# Patient Record
Sex: Female | Born: 1935
Health system: Southern US, Community
[De-identification: ages and names within clinical notes are randomized; demographics above are authoritative.]

## PROBLEM LIST (undated history)

## (undated) DIAGNOSIS — I10 Essential (primary) hypertension: Secondary | ICD-10-CM

## (undated) NOTE — *Deleted (*Deleted)
-----------------------------------------   11:41 PM on 06/21/2020 -----------------------------------------  Assuming care from Dr. Larinda Buttery.  In short, Tina [name] is a 34 y.o. female with a chief complaint of fall.  Refer to the original H&P for additional details.  The current plan of care is to reassess troponin.  Patient wants to go home, so anticipate discharge unless trop is abnormal.

---

## 1969-03-30 HISTORY — PX: DILATION AND CURETTAGE OF UTERUS: SHX78

## 1979-03-31 HISTORY — PX: ABDOMINAL HYSTERECTOMY: SHX81

## 1994-12-21 DIAGNOSIS — E669 Obesity, unspecified: Secondary | ICD-10-CM | POA: Insufficient documentation

## 2002-07-30 HISTORY — PX: KNEE SURGERY: SHX244

## 2002-08-13 ENCOUNTER — Encounter: Payer: Self-pay | Admitting: Orthopedic Surgery

## 2002-08-19 ENCOUNTER — Inpatient Hospital Stay (HOSPITAL_COMMUNITY): Admission: RE | Admit: 2002-08-19 | Discharge: 2002-08-26 | Payer: Self-pay | Admitting: Orthopedic Surgery

## 2003-08-19 LAB — HM COLONOSCOPY

## 2005-10-10 ENCOUNTER — Encounter: Payer: Self-pay | Admitting: Internal Medicine

## 2005-10-28 ENCOUNTER — Encounter: Payer: Self-pay | Admitting: Internal Medicine

## 2006-12-28 ENCOUNTER — Ambulatory Visit: Payer: Self-pay | Admitting: Family Medicine

## 2009-02-14 DIAGNOSIS — R351 Nocturia: Secondary | ICD-10-CM | POA: Insufficient documentation

## 2009-02-14 DIAGNOSIS — N959 Unspecified menopausal and perimenopausal disorder: Secondary | ICD-10-CM | POA: Insufficient documentation

## 2009-02-15 ENCOUNTER — Inpatient Hospital Stay: Payer: Self-pay | Admitting: Internal Medicine

## 2010-07-12 ENCOUNTER — Ambulatory Visit: Payer: Self-pay | Admitting: Family Medicine

## 2010-10-25 ENCOUNTER — Other Ambulatory Visit (HOSPITAL_COMMUNITY): Payer: Self-pay | Admitting: Internal Medicine

## 2010-10-25 DIAGNOSIS — R0781 Pleurodynia: Secondary | ICD-10-CM

## 2010-10-30 ENCOUNTER — Ambulatory Visit (HOSPITAL_COMMUNITY): Payer: Self-pay

## 2010-10-30 ENCOUNTER — Encounter (HOSPITAL_COMMUNITY)
Admission: RE | Admit: 2010-10-30 | Discharge: 2010-10-30 | Disposition: A | Discharge status: Home / Self Care | Payer: Medicare Other | Source: Ambulatory Visit | Attending: Internal Medicine | Admitting: Internal Medicine

## 2010-10-30 DIAGNOSIS — R0781 Pleurodynia: Secondary | ICD-10-CM

## 2010-10-30 DIAGNOSIS — M899 Disorder of bone, unspecified: Secondary | ICD-10-CM | POA: Insufficient documentation

## 2010-10-30 DIAGNOSIS — M949 Disorder of cartilage, unspecified: Secondary | ICD-10-CM | POA: Insufficient documentation

## 2010-10-30 DIAGNOSIS — R079 Chest pain, unspecified: Secondary | ICD-10-CM | POA: Insufficient documentation

## 2010-10-30 MED ORDER — TECHNETIUM TC 99M MEDRONATE IV KIT
25.0000 | PACK | Freq: Once | INTRAVENOUS | Status: AC | PRN
  Administered 2010-10-30: 25 via INTRAVENOUS

## 2011-07-11 ENCOUNTER — Ambulatory Visit: Payer: Self-pay | Admitting: Family Medicine

## 2011-08-01 DIAGNOSIS — N83209 Unspecified ovarian cyst, unspecified side: Secondary | ICD-10-CM | POA: Diagnosis not present

## 2011-08-03 ENCOUNTER — Ambulatory Visit: Payer: Self-pay | Admitting: Urology

## 2011-08-03 DIAGNOSIS — Q6239 Other obstructive defects of renal pelvis and ureter: Secondary | ICD-10-CM | POA: Diagnosis not present

## 2011-08-03 DIAGNOSIS — R948 Abnormal results of function studies of other organs and systems: Secondary | ICD-10-CM | POA: Diagnosis not present

## 2011-08-03 DIAGNOSIS — N2889 Other specified disorders of kidney and ureter: Secondary | ICD-10-CM | POA: Diagnosis not present

## 2011-08-21 ENCOUNTER — Ambulatory Visit: Payer: Self-pay | Admitting: Obstetrics & Gynecology

## 2011-08-21 DIAGNOSIS — D279 Benign neoplasm of unspecified ovary: Secondary | ICD-10-CM | POA: Diagnosis not present

## 2011-08-21 DIAGNOSIS — N83209 Unspecified ovarian cyst, unspecified side: Secondary | ICD-10-CM | POA: Diagnosis not present

## 2011-08-21 DIAGNOSIS — Z01810 Encounter for preprocedural cardiovascular examination: Secondary | ICD-10-CM | POA: Diagnosis not present

## 2011-08-21 DIAGNOSIS — Z01818 Encounter for other preprocedural examination: Secondary | ICD-10-CM | POA: Diagnosis not present

## 2011-08-21 DIAGNOSIS — Z01812 Encounter for preprocedural laboratory examination: Secondary | ICD-10-CM | POA: Diagnosis not present

## 2011-08-21 LAB — POTASSIUM: Potassium: 4.1 mmol/L (ref 3.5–5.1)

## 2011-08-21 LAB — CBC
HGB: 12.5 g/dL (ref 12.0–16.0)
MCHC: 33.6 g/dL (ref 32.0–36.0)
Platelet: 308 10*3/uL (ref 150–440)
RBC: 3.99 10*6/uL (ref 3.80–5.20)

## 2011-09-04 ENCOUNTER — Ambulatory Visit: Payer: Self-pay | Admitting: Obstetrics & Gynecology

## 2011-09-04 DIAGNOSIS — Z96659 Presence of unspecified artificial knee joint: Secondary | ICD-10-CM | POA: Diagnosis not present

## 2011-09-04 DIAGNOSIS — Z7982 Long term (current) use of aspirin: Secondary | ICD-10-CM | POA: Diagnosis not present

## 2011-09-04 DIAGNOSIS — Z79899 Other long term (current) drug therapy: Secondary | ICD-10-CM | POA: Diagnosis not present

## 2011-09-04 DIAGNOSIS — M129 Arthropathy, unspecified: Secondary | ICD-10-CM | POA: Diagnosis not present

## 2011-09-04 DIAGNOSIS — D279 Benign neoplasm of unspecified ovary: Secondary | ICD-10-CM | POA: Diagnosis not present

## 2011-09-04 DIAGNOSIS — K660 Peritoneal adhesions (postprocedural) (postinfection): Secondary | ICD-10-CM | POA: Diagnosis not present

## 2011-09-04 DIAGNOSIS — N83209 Unspecified ovarian cyst, unspecified side: Secondary | ICD-10-CM | POA: Diagnosis not present

## 2011-09-04 DIAGNOSIS — N838 Other noninflammatory disorders of ovary, fallopian tube and broad ligament: Secondary | ICD-10-CM | POA: Diagnosis not present

## 2011-09-06 LAB — PATHOLOGY REPORT

## 2011-10-09 DIAGNOSIS — Z1231 Encounter for screening mammogram for malignant neoplasm of breast: Secondary | ICD-10-CM | POA: Diagnosis not present

## 2011-10-09 LAB — HM MAMMOGRAPHY

## 2011-11-02 DIAGNOSIS — S60219A Contusion of unspecified wrist, initial encounter: Secondary | ICD-10-CM | POA: Diagnosis not present

## 2011-11-02 DIAGNOSIS — S8000XA Contusion of unspecified knee, initial encounter: Secondary | ICD-10-CM | POA: Diagnosis not present

## 2011-11-02 DIAGNOSIS — S1093XA Contusion of unspecified part of neck, initial encounter: Secondary | ICD-10-CM | POA: Diagnosis not present

## 2011-11-02 DIAGNOSIS — S0083XA Contusion of other part of head, initial encounter: Secondary | ICD-10-CM | POA: Diagnosis not present

## 2011-11-08 DIAGNOSIS — M159 Polyosteoarthritis, unspecified: Secondary | ICD-10-CM | POA: Diagnosis not present

## 2011-11-08 DIAGNOSIS — M353 Polymyalgia rheumatica: Secondary | ICD-10-CM | POA: Diagnosis not present

## 2011-11-08 DIAGNOSIS — M545 Low back pain: Secondary | ICD-10-CM | POA: Diagnosis not present

## 2012-01-17 DIAGNOSIS — M25539 Pain in unspecified wrist: Secondary | ICD-10-CM | POA: Diagnosis not present

## 2012-01-17 DIAGNOSIS — M159 Polyosteoarthritis, unspecified: Secondary | ICD-10-CM | POA: Diagnosis not present

## 2012-01-17 DIAGNOSIS — M25839 Other specified joint disorders, unspecified wrist: Secondary | ICD-10-CM | POA: Diagnosis not present

## 2012-01-17 DIAGNOSIS — M353 Polymyalgia rheumatica: Secondary | ICD-10-CM | POA: Diagnosis not present

## 2012-01-17 DIAGNOSIS — M19039 Primary osteoarthritis, unspecified wrist: Secondary | ICD-10-CM | POA: Diagnosis not present

## 2012-01-24 DIAGNOSIS — M159 Polyosteoarthritis, unspecified: Secondary | ICD-10-CM | POA: Diagnosis not present

## 2012-01-24 DIAGNOSIS — M25539 Pain in unspecified wrist: Secondary | ICD-10-CM | POA: Diagnosis not present

## 2012-01-24 DIAGNOSIS — M353 Polymyalgia rheumatica: Secondary | ICD-10-CM | POA: Diagnosis not present

## 2012-01-30 ENCOUNTER — Other Ambulatory Visit: Payer: Self-pay | Admitting: Internal Medicine

## 2012-01-30 DIAGNOSIS — M25531 Pain in right wrist: Secondary | ICD-10-CM

## 2012-02-06 ENCOUNTER — Ambulatory Visit
Admission: RE | Admit: 2012-02-06 | Discharge: 2012-02-06 | Disposition: A | Discharge status: Home / Self Care | Payer: Medicare Other | Source: Ambulatory Visit | Attending: Internal Medicine | Admitting: Internal Medicine

## 2012-02-06 DIAGNOSIS — M659 Synovitis and tenosynovitis, unspecified: Secondary | ICD-10-CM | POA: Diagnosis not present

## 2012-02-06 DIAGNOSIS — M25531 Pain in right wrist: Secondary | ICD-10-CM

## 2012-02-06 DIAGNOSIS — S63509A Unspecified sprain of unspecified wrist, initial encounter: Secondary | ICD-10-CM | POA: Diagnosis not present

## 2012-02-06 DIAGNOSIS — M19049 Primary osteoarthritis, unspecified hand: Secondary | ICD-10-CM | POA: Diagnosis not present

## 2012-02-06 MED ORDER — GADOBENATE DIMEGLUMINE 529 MG/ML IV SOLN: 16.0000 mL | Freq: Once | INTRAVENOUS | Status: AC | PRN

## 2012-03-11 DIAGNOSIS — M159 Polyosteoarthritis, unspecified: Secondary | ICD-10-CM | POA: Diagnosis not present

## 2012-03-11 DIAGNOSIS — M353 Polymyalgia rheumatica: Secondary | ICD-10-CM | POA: Diagnosis not present

## 2012-03-11 DIAGNOSIS — M25539 Pain in unspecified wrist: Secondary | ICD-10-CM | POA: Diagnosis not present

## 2012-03-20 DIAGNOSIS — S8000XA Contusion of unspecified knee, initial encounter: Secondary | ICD-10-CM | POA: Diagnosis not present

## 2012-03-20 DIAGNOSIS — L0291 Cutaneous abscess, unspecified: Secondary | ICD-10-CM | POA: Diagnosis not present

## 2012-03-20 DIAGNOSIS — L0390 Cellulitis, unspecified: Secondary | ICD-10-CM | POA: Diagnosis not present

## 2012-03-20 DIAGNOSIS — S60219A Contusion of unspecified wrist, initial encounter: Secondary | ICD-10-CM | POA: Diagnosis not present

## 2012-04-04 DIAGNOSIS — S8000XA Contusion of unspecified knee, initial encounter: Secondary | ICD-10-CM | POA: Diagnosis not present

## 2012-04-04 DIAGNOSIS — L0390 Cellulitis, unspecified: Secondary | ICD-10-CM | POA: Diagnosis not present

## 2012-04-04 DIAGNOSIS — S60219A Contusion of unspecified wrist, initial encounter: Secondary | ICD-10-CM | POA: Diagnosis not present

## 2012-04-04 DIAGNOSIS — L0291 Cutaneous abscess, unspecified: Secondary | ICD-10-CM | POA: Diagnosis not present

## 2012-04-10 DIAGNOSIS — L03221 Cellulitis of neck: Secondary | ICD-10-CM | POA: Diagnosis not present

## 2012-04-10 DIAGNOSIS — H6120 Impacted cerumen, unspecified ear: Secondary | ICD-10-CM | POA: Diagnosis not present

## 2012-04-10 DIAGNOSIS — H903 Sensorineural hearing loss, bilateral: Secondary | ICD-10-CM | POA: Diagnosis not present

## 2012-04-29 DIAGNOSIS — L03221 Cellulitis of neck: Secondary | ICD-10-CM | POA: Diagnosis not present

## 2012-04-29 DIAGNOSIS — L0211 Cutaneous abscess of neck: Secondary | ICD-10-CM | POA: Diagnosis not present

## 2012-05-02 DIAGNOSIS — L570 Actinic keratosis: Secondary | ICD-10-CM | POA: Diagnosis not present

## 2012-05-02 DIAGNOSIS — D692 Other nonthrombocytopenic purpura: Secondary | ICD-10-CM | POA: Diagnosis not present

## 2012-05-05 DIAGNOSIS — Z23 Encounter for immunization: Secondary | ICD-10-CM | POA: Diagnosis not present

## 2012-07-10 DIAGNOSIS — M159 Polyosteoarthritis, unspecified: Secondary | ICD-10-CM | POA: Diagnosis not present

## 2012-07-10 DIAGNOSIS — M353 Polymyalgia rheumatica: Secondary | ICD-10-CM | POA: Diagnosis not present

## 2012-07-10 DIAGNOSIS — M25539 Pain in unspecified wrist: Secondary | ICD-10-CM | POA: Diagnosis not present

## 2012-08-11 DIAGNOSIS — G4700 Insomnia, unspecified: Secondary | ICD-10-CM | POA: Diagnosis not present

## 2012-08-11 DIAGNOSIS — R233 Spontaneous ecchymoses: Secondary | ICD-10-CM | POA: Diagnosis not present

## 2012-08-11 DIAGNOSIS — R229 Localized swelling, mass and lump, unspecified: Secondary | ICD-10-CM | POA: Diagnosis not present

## 2012-08-11 DIAGNOSIS — I10 Essential (primary) hypertension: Secondary | ICD-10-CM | POA: Diagnosis not present

## 2012-08-25 DIAGNOSIS — G4700 Insomnia, unspecified: Secondary | ICD-10-CM | POA: Diagnosis not present

## 2012-08-25 DIAGNOSIS — M542 Cervicalgia: Secondary | ICD-10-CM | POA: Diagnosis not present

## 2012-08-25 DIAGNOSIS — I10 Essential (primary) hypertension: Secondary | ICD-10-CM | POA: Diagnosis not present

## 2012-09-22 DIAGNOSIS — R7309 Other abnormal glucose: Secondary | ICD-10-CM | POA: Diagnosis not present

## 2012-09-22 DIAGNOSIS — I10 Essential (primary) hypertension: Secondary | ICD-10-CM | POA: Diagnosis not present

## 2012-09-22 DIAGNOSIS — G4700 Insomnia, unspecified: Secondary | ICD-10-CM | POA: Diagnosis not present

## 2012-11-06 DIAGNOSIS — M159 Polyosteoarthritis, unspecified: Secondary | ICD-10-CM | POA: Diagnosis not present

## 2012-11-06 DIAGNOSIS — M25539 Pain in unspecified wrist: Secondary | ICD-10-CM | POA: Diagnosis not present

## 2012-11-06 DIAGNOSIS — M353 Polymyalgia rheumatica: Secondary | ICD-10-CM | POA: Diagnosis not present

## 2013-01-21 DIAGNOSIS — M1990 Unspecified osteoarthritis, unspecified site: Secondary | ICD-10-CM | POA: Diagnosis not present

## 2013-01-21 DIAGNOSIS — G4700 Insomnia, unspecified: Secondary | ICD-10-CM | POA: Diagnosis not present

## 2013-01-21 DIAGNOSIS — I10 Essential (primary) hypertension: Secondary | ICD-10-CM | POA: Diagnosis not present

## 2013-02-24 DIAGNOSIS — M159 Polyosteoarthritis, unspecified: Secondary | ICD-10-CM | POA: Diagnosis not present

## 2013-02-24 DIAGNOSIS — M169 Osteoarthritis of hip, unspecified: Secondary | ICD-10-CM | POA: Diagnosis not present

## 2013-02-24 DIAGNOSIS — M25539 Pain in unspecified wrist: Secondary | ICD-10-CM | POA: Diagnosis not present

## 2013-02-24 DIAGNOSIS — M353 Polymyalgia rheumatica: Secondary | ICD-10-CM | POA: Diagnosis not present

## 2013-02-24 DIAGNOSIS — M25559 Pain in unspecified hip: Secondary | ICD-10-CM | POA: Diagnosis not present

## 2013-03-11 DIAGNOSIS — M1610 Unilateral primary osteoarthritis, unspecified hip: Secondary | ICD-10-CM | POA: Diagnosis not present

## 2013-03-11 DIAGNOSIS — M25559 Pain in unspecified hip: Secondary | ICD-10-CM | POA: Diagnosis not present

## 2013-03-17 DIAGNOSIS — I10 Essential (primary) hypertension: Secondary | ICD-10-CM | POA: Diagnosis not present

## 2013-04-07 DIAGNOSIS — Z23 Encounter for immunization: Secondary | ICD-10-CM | POA: Diagnosis not present

## 2013-05-15 DIAGNOSIS — Z01818 Encounter for other preprocedural examination: Secondary | ICD-10-CM | POA: Diagnosis not present

## 2013-05-15 DIAGNOSIS — G4700 Insomnia, unspecified: Secondary | ICD-10-CM | POA: Diagnosis not present

## 2013-05-15 DIAGNOSIS — M1990 Unspecified osteoarthritis, unspecified site: Secondary | ICD-10-CM | POA: Diagnosis not present

## 2013-05-27 DIAGNOSIS — M353 Polymyalgia rheumatica: Secondary | ICD-10-CM | POA: Diagnosis not present

## 2013-05-27 DIAGNOSIS — R82998 Other abnormal findings in urine: Secondary | ICD-10-CM | POA: Diagnosis not present

## 2013-05-27 DIAGNOSIS — M1610 Unilateral primary osteoarthritis, unspecified hip: Secondary | ICD-10-CM | POA: Diagnosis not present

## 2013-05-27 DIAGNOSIS — M25559 Pain in unspecified hip: Secondary | ICD-10-CM | POA: Diagnosis not present

## 2013-05-27 DIAGNOSIS — Z01818 Encounter for other preprocedural examination: Secondary | ICD-10-CM | POA: Diagnosis not present

## 2013-06-01 DIAGNOSIS — Z96649 Presence of unspecified artificial hip joint: Secondary | ICD-10-CM | POA: Diagnosis not present

## 2013-06-01 DIAGNOSIS — Z471 Aftercare following joint replacement surgery: Secondary | ICD-10-CM | POA: Diagnosis not present

## 2013-06-01 DIAGNOSIS — Z5189 Encounter for other specified aftercare: Secondary | ICD-10-CM | POA: Diagnosis not present

## 2013-06-01 DIAGNOSIS — M069 Rheumatoid arthritis, unspecified: Secondary | ICD-10-CM | POA: Diagnosis present

## 2013-06-01 DIAGNOSIS — M353 Polymyalgia rheumatica: Secondary | ICD-10-CM | POA: Diagnosis present

## 2013-06-01 DIAGNOSIS — M1610 Unilateral primary osteoarthritis, unspecified hip: Secondary | ICD-10-CM | POA: Diagnosis not present

## 2013-06-01 HISTORY — PX: HIP SURGERY: SHX245

## 2013-06-04 DIAGNOSIS — Z5189 Encounter for other specified aftercare: Secondary | ICD-10-CM | POA: Diagnosis not present

## 2013-06-04 DIAGNOSIS — K56609 Unspecified intestinal obstruction, unspecified as to partial versus complete obstruction: Secondary | ICD-10-CM | POA: Diagnosis not present

## 2013-06-04 DIAGNOSIS — R1084 Generalized abdominal pain: Secondary | ICD-10-CM | POA: Diagnosis not present

## 2013-06-04 DIAGNOSIS — M069 Rheumatoid arthritis, unspecified: Secondary | ICD-10-CM | POA: Diagnosis not present

## 2013-06-04 DIAGNOSIS — Z471 Aftercare following joint replacement surgery: Secondary | ICD-10-CM | POA: Diagnosis not present

## 2013-06-04 DIAGNOSIS — Z96649 Presence of unspecified artificial hip joint: Secondary | ICD-10-CM | POA: Diagnosis not present

## 2013-06-07 DIAGNOSIS — K56609 Unspecified intestinal obstruction, unspecified as to partial versus complete obstruction: Secondary | ICD-10-CM | POA: Diagnosis not present

## 2013-06-07 DIAGNOSIS — R9431 Abnormal electrocardiogram [ECG] [EKG]: Secondary | ICD-10-CM | POA: Diagnosis not present

## 2013-06-07 LAB — COMPREHENSIVE METABOLIC PANEL
Albumin: 2.2 g/dL — ABNORMAL LOW (ref 3.4–5.0)
Alkaline Phosphatase: 155 U/L — ABNORMAL HIGH (ref 50–136)
Anion Gap: 8 (ref 7–16)
BUN: 33 mg/dL — ABNORMAL HIGH (ref 7–18)
Bilirubin,Total: 0.6 mg/dL (ref 0.2–1.0)
Calcium, Total: 9.3 mg/dL (ref 8.5–10.1)
Chloride: 95 mmol/L — ABNORMAL LOW (ref 98–107)
Creatinine: 0.99 mg/dL (ref 0.60–1.30)
EGFR (African American): >60
EGFR (Non-African Amer.): 55 — ABNORMAL LOW
Glucose: 137 mg/dL — ABNORMAL HIGH (ref 65–99)
Osmolality: 278 (ref 275–301)
Potassium: 3.5 mmol/L (ref 3.5–5.1)
SGOT(AST): 23 U/L (ref 15–37)
SGPT (ALT): 25 U/L (ref 12–78)
Sodium: 134 mmol/L — ABNORMAL LOW (ref 136–145)
Total Protein: 7.1 g/dL (ref 6.4–8.2)

## 2013-06-07 LAB — CBC
HCT: 33 % — ABNORMAL LOW (ref 35.0–47.0)
MCHC: 34.6 g/dL (ref 32.0–36.0)
MCV: 92 fL (ref 80–100)
Platelet: 525 10*3/uL — ABNORMAL HIGH (ref 150–440)
RDW: 15.4 % — ABNORMAL HIGH (ref 11.5–14.5)
WBC: 13.1 10*3/uL — ABNORMAL HIGH (ref 3.6–11.0)

## 2013-06-08 ENCOUNTER — Inpatient Hospital Stay: Payer: Self-pay | Admitting: Surgery

## 2013-06-08 DIAGNOSIS — R109 Unspecified abdominal pain: Secondary | ICD-10-CM | POA: Diagnosis not present

## 2013-06-08 DIAGNOSIS — K658 Other peritonitis: Secondary | ICD-10-CM | POA: Diagnosis not present

## 2013-06-08 DIAGNOSIS — D126 Benign neoplasm of colon, unspecified: Secondary | ICD-10-CM | POA: Diagnosis not present

## 2013-06-08 DIAGNOSIS — R0989 Other specified symptoms and signs involving the circulatory and respiratory systems: Secondary | ICD-10-CM | POA: Diagnosis not present

## 2013-06-08 DIAGNOSIS — M129 Arthropathy, unspecified: Secondary | ICD-10-CM | POA: Diagnosis present

## 2013-06-08 DIAGNOSIS — Z90710 Acquired absence of both cervix and uterus: Secondary | ICD-10-CM | POA: Diagnosis not present

## 2013-06-08 DIAGNOSIS — H9190 Unspecified hearing loss, unspecified ear: Secondary | ICD-10-CM | POA: Diagnosis present

## 2013-06-08 DIAGNOSIS — K5732 Diverticulitis of large intestine without perforation or abscess without bleeding: Secondary | ICD-10-CM | POA: Diagnosis not present

## 2013-06-08 DIAGNOSIS — Z433 Encounter for attention to colostomy: Secondary | ICD-10-CM | POA: Diagnosis not present

## 2013-06-08 DIAGNOSIS — I10 Essential (primary) hypertension: Secondary | ICD-10-CM | POA: Diagnosis present

## 2013-06-08 DIAGNOSIS — D62 Acute posthemorrhagic anemia: Secondary | ICD-10-CM | POA: Diagnosis not present

## 2013-06-08 DIAGNOSIS — Z452 Encounter for adjustment and management of vascular access device: Secondary | ICD-10-CM | POA: Diagnosis not present

## 2013-06-08 DIAGNOSIS — R9431 Abnormal electrocardiogram [ECG] [EKG]: Secondary | ICD-10-CM | POA: Diagnosis not present

## 2013-06-08 DIAGNOSIS — Z96659 Presence of unspecified artificial knee joint: Secondary | ICD-10-CM | POA: Diagnosis not present

## 2013-06-08 DIAGNOSIS — E441 Mild protein-calorie malnutrition: Secondary | ICD-10-CM | POA: Diagnosis not present

## 2013-06-08 DIAGNOSIS — K560 Paralytic ileus: Secondary | ICD-10-CM | POA: Diagnosis not present

## 2013-06-08 DIAGNOSIS — IMO0001 Reserved for inherently not codable concepts without codable children: Secondary | ICD-10-CM | POA: Diagnosis not present

## 2013-06-08 DIAGNOSIS — K929 Disease of digestive system, unspecified: Secondary | ICD-10-CM | POA: Diagnosis not present

## 2013-06-08 DIAGNOSIS — Z96649 Presence of unspecified artificial hip joint: Secondary | ICD-10-CM | POA: Diagnosis not present

## 2013-06-08 DIAGNOSIS — D72829 Elevated white blood cell count, unspecified: Secondary | ICD-10-CM | POA: Diagnosis not present

## 2013-06-08 DIAGNOSIS — K5730 Diverticulosis of large intestine without perforation or abscess without bleeding: Secondary | ICD-10-CM | POA: Diagnosis not present

## 2013-06-08 DIAGNOSIS — R269 Unspecified abnormalities of gait and mobility: Secondary | ICD-10-CM | POA: Diagnosis not present

## 2013-06-08 DIAGNOSIS — Z6830 Body mass index (BMI) 30.0-30.9, adult: Secondary | ICD-10-CM | POA: Diagnosis not present

## 2013-06-08 DIAGNOSIS — K56609 Unspecified intestinal obstruction, unspecified as to partial versus complete obstruction: Secondary | ICD-10-CM | POA: Diagnosis not present

## 2013-06-08 HISTORY — PX: COLON SURGERY: SHX602

## 2013-06-08 LAB — CBC WITH DIFFERENTIAL/PLATELET
Basophil #: 0 10*3/uL (ref 0.0–0.1)
Basophil %: 0.2 %
Eosinophil #: 0.1 10*3/uL (ref 0.0–0.7)
Eosinophil %: 0.6 %
HCT: 31.8 % — ABNORMAL LOW (ref 35.0–47.0)
HGB: 11.1 g/dL — ABNORMAL LOW (ref 12.0–16.0)
Lymphocyte #: 0.2 10*3/uL — ABNORMAL LOW (ref 1.0–3.6)
MCH: 32 pg (ref 26.0–34.0)
MCHC: 34.8 g/dL (ref 32.0–36.0)
MCV: 92 fL (ref 80–100)
Monocyte #: 0.4 x10 3/mm (ref 0.2–0.9)
Monocyte %: 3.5 %
Neutrophil #: 9.9 10*3/uL — ABNORMAL HIGH (ref 1.4–6.5)
Platelet: 518 10*3/uL — ABNORMAL HIGH (ref 150–440)
WBC: 10.5 10*3/uL (ref 3.6–11.0)

## 2013-06-08 LAB — URINALYSIS, COMPLETE
Glucose,UR: NEGATIVE mg/dL (ref 0–75)
Leukocyte Esterase: NEGATIVE
Nitrite: NEGATIVE
Ph: 5 (ref 4.5–8.0)
RBC,UR: 37 /HPF (ref 0–5)
Squamous Epithelial: NONE SEEN
WBC UR: 9 /HPF (ref 0–5)

## 2013-06-08 LAB — BASIC METABOLIC PANEL
Anion Gap: 8 (ref 7–16)
Glucose: 168 mg/dL — ABNORMAL HIGH (ref 65–99)
Potassium: 3.6 mmol/L (ref 3.5–5.1)
Sodium: 139 mmol/L (ref 136–145)

## 2013-06-09 LAB — CBC WITH DIFFERENTIAL/PLATELET
Bands: 16 %
Bands: 14 %
Comment - H1-Com2: NORMAL
HCT: 23.9 % — ABNORMAL LOW (ref 35.0–47.0)
HGB: 8 g/dL — ABNORMAL LOW (ref 12.0–16.0)
Lymphocytes: 4 %
MCH: 31.2 pg (ref 26.0–34.0)
MCV: 93 fL (ref 80–100)
MCV: 93 fL (ref 80–100)
Metamyelocyte: 2 %
Monocytes: 4 %
Monocytes: 4 %
Myelocyte: 2 %
Platelet: 385 10*3/uL (ref 150–440)
RBC: 2.45 10*6/uL — ABNORMAL LOW (ref 3.80–5.20)
RDW: 15.9 % — ABNORMAL HIGH (ref 11.5–14.5)
Segmented Neutrophils: 74 %
WBC: 18.7 10*3/uL — ABNORMAL HIGH (ref 3.6–11.0)
WBC: 19.1 10*3/uL — ABNORMAL HIGH (ref 3.6–11.0)

## 2013-06-09 LAB — PATHOLOGY REPORT

## 2013-06-09 LAB — BASIC METABOLIC PANEL
Anion Gap: 8 (ref 7–16)
Chloride: 101 mmol/L (ref 98–107)
Co2: 26 mmol/L (ref 21–32)
Creatinine: 1.76 mg/dL — ABNORMAL HIGH (ref 0.60–1.30)
EGFR (Non-African Amer.): 27 — ABNORMAL LOW
Osmolality: 283 (ref 275–301)
Potassium: 4.5 mmol/L (ref 3.5–5.1)

## 2013-06-10 LAB — BASIC METABOLIC PANEL
BUN: 34 mg/dL — ABNORMAL HIGH (ref 7–18)
Calcium, Total: 7.7 mg/dL — ABNORMAL LOW (ref 8.5–10.1)
Creatinine: 1.2 mg/dL (ref 0.60–1.30)
EGFR (Non-African Amer.): 43 — ABNORMAL LOW
Glucose: 110 mg/dL — ABNORMAL HIGH (ref 65–99)
Osmolality: 278 (ref 275–301)
Potassium: 5.1 mmol/L (ref 3.5–5.1)

## 2013-06-10 LAB — CBC WITH DIFFERENTIAL/PLATELET
HGB: 7.1 g/dL — ABNORMAL LOW (ref 12.0–16.0)
Lymphocytes: 5 %
MCH: 31.3 pg (ref 26.0–34.0)
MCV: 92 fL (ref 80–100)
Myelocyte: 1 %
Segmented Neutrophils: 84 %

## 2013-06-10 LAB — PHOSPHORUS: Phosphorus: 3.1 mg/dL (ref 2.5–4.9)

## 2013-06-12 ENCOUNTER — Encounter: Payer: Self-pay | Admitting: Internal Medicine

## 2013-06-12 LAB — MAGNESIUM: Magnesium: 1.8 mg/dL

## 2013-06-12 LAB — BASIC METABOLIC PANEL
Chloride: 97 mmol/L — ABNORMAL LOW (ref 98–107)
Creatinine: 1.09 mg/dL (ref 0.60–1.30)
EGFR (African American): 56 — ABNORMAL LOW
Glucose: 142 mg/dL — ABNORMAL HIGH (ref 65–99)
Osmolality: 274 (ref 275–301)
Potassium: 3.9 mmol/L (ref 3.5–5.1)
Sodium: 133 mmol/L — ABNORMAL LOW (ref 136–145)

## 2013-06-13 LAB — MAGNESIUM: Magnesium: 1.4 mg/dL — ABNORMAL LOW

## 2013-06-13 LAB — CALCIUM: Calcium, Total: 8.3 mg/dL — ABNORMAL LOW (ref 8.5–10.1)

## 2013-06-13 LAB — PHOSPHORUS: Phosphorus: 3 mg/dL (ref 2.5–4.9)

## 2013-06-13 LAB — POTASSIUM: Potassium: 3.9 mmol/L (ref 3.5–5.1)

## 2013-06-14 LAB — CBC WITH DIFFERENTIAL/PLATELET
Eosinophil: 4 %
HCT: 30.3 % — ABNORMAL LOW (ref 35.0–47.0)
Lymphocytes: 7 %
MCHC: 33.1 g/dL (ref 32.0–36.0)
Monocytes: 7 %
Myelocyte: 2 %
Platelet: 476 10*3/uL — ABNORMAL HIGH (ref 150–440)
RBC: 3.32 10*6/uL — ABNORMAL LOW (ref 3.80–5.20)
RDW: 15 % — ABNORMAL HIGH (ref 11.5–14.5)
Segmented Neutrophils: 75 %
Variant Lymphocyte - H1-Rlymph: 1 %

## 2013-06-14 LAB — PHOSPHORUS: Phosphorus: 3.5 mg/dL (ref 2.5–4.9)

## 2013-06-14 LAB — SODIUM: Sodium: 133 mmol/L — ABNORMAL LOW (ref 136–145)

## 2013-06-15 LAB — POTASSIUM: Potassium: 4.1 mmol/L (ref 3.5–5.1)

## 2013-06-15 LAB — CALCIUM: Calcium, Total: 8.3 mg/dL — ABNORMAL LOW (ref 8.5–10.1)

## 2013-06-16 LAB — CREATININE, SERUM: EGFR (Non-African Amer.): 50 — ABNORMAL LOW

## 2013-06-16 LAB — MAGNESIUM: Magnesium: 1.7 mg/dL — ABNORMAL LOW

## 2013-06-16 LAB — PHOSPHORUS: Phosphorus: 4 mg/dL (ref 2.5–4.9)

## 2013-06-17 LAB — SODIUM: Sodium: 134 mmol/L — ABNORMAL LOW (ref 136–145)

## 2013-06-17 LAB — PHOSPHORUS: Phosphorus: 3.3 mg/dL (ref 2.5–4.9)

## 2013-06-17 LAB — MAGNESIUM: Magnesium: 1.6 mg/dL — ABNORMAL LOW

## 2013-06-17 LAB — CALCIUM: Calcium, Total: 8 mg/dL — ABNORMAL LOW (ref 8.5–10.1)

## 2013-06-17 LAB — POTASSIUM: Potassium: 4 mmol/L (ref 3.5–5.1)

## 2013-06-18 LAB — SODIUM: Sodium: 135 mmol/L — ABNORMAL LOW (ref 136–145)

## 2013-06-18 LAB — PHOSPHORUS: Phosphorus: 3.3 mg/dL (ref 2.5–4.9)

## 2013-06-18 LAB — POTASSIUM: Potassium: 4.2 mmol/L (ref 3.5–5.1)

## 2013-06-19 LAB — CBC WITH DIFFERENTIAL/PLATELET
Basophil #: 0.1 10*3/uL (ref 0.0–0.1)
Basophil %: 0.5 %
Eosinophil #: 0.1 10*3/uL (ref 0.0–0.7)
Eosinophil %: 0.6 %
HGB: 8.1 g/dL — ABNORMAL LOW (ref 12.0–16.0)
MCH: 29.5 pg (ref 26.0–34.0)
MCV: 91 fL (ref 80–100)
Monocyte #: 1.5 x10 3/mm — ABNORMAL HIGH (ref 0.2–0.9)
Monocyte %: 8.7 %
Neutrophil %: 85.3 %
RBC: 2.76 10*6/uL — ABNORMAL LOW (ref 3.80–5.20)
WBC: 17.6 10*3/uL — ABNORMAL HIGH (ref 3.6–11.0)

## 2013-06-19 LAB — SODIUM: Sodium: 136 mmol/L (ref 136–145)

## 2013-06-19 LAB — BASIC METABOLIC PANEL
BUN: 26 mg/dL — ABNORMAL HIGH (ref 7–18)
Chloride: 103 mmol/L (ref 98–107)
Co2: 29 mmol/L (ref 21–32)
Glucose: 159 mg/dL — ABNORMAL HIGH (ref 65–99)
Osmolality: 280 (ref 275–301)

## 2013-06-19 LAB — CREATININE, SERUM
Creatinine: 1.04 mg/dL (ref 0.60–1.30)
EGFR (Non-African Amer.): 51 — ABNORMAL LOW

## 2013-06-20 LAB — CALCIUM: Calcium, Total: 8 mg/dL — ABNORMAL LOW (ref 8.5–10.1)

## 2013-06-20 LAB — PHOSPHORUS: Phosphorus: 3.3 mg/dL (ref 2.5–4.9)

## 2013-06-21 LAB — BASIC METABOLIC PANEL
Anion Gap: 5 — ABNORMAL LOW (ref 7–16)
BUN: 28 mg/dL — ABNORMAL HIGH (ref 7–18)
Chloride: 105 mmol/L (ref 98–107)
Creatinine: 1.08 mg/dL (ref 0.60–1.30)
EGFR (African American): 57 — ABNORMAL LOW
EGFR (Non-African Amer.): 49 — ABNORMAL LOW
Glucose: 138 mg/dL — ABNORMAL HIGH (ref 65–99)
Osmolality: 281 (ref 275–301)
Potassium: 4.4 mmol/L (ref 3.5–5.1)
Sodium: 137 mmol/L (ref 136–145)

## 2013-06-21 LAB — CBC WITH DIFFERENTIAL/PLATELET
Basophil #: 0.1 10*3/uL (ref 0.0–0.1)
Basophil %: 0.5 %
Eosinophil #: 0.2 10*3/uL (ref 0.0–0.7)
HCT: 25.4 % — ABNORMAL LOW (ref 35.0–47.0)
HGB: 8.4 g/dL — ABNORMAL LOW (ref 12.0–16.0)
Lymphocyte %: 7.7 %
Monocyte #: 1.6 x10 3/mm — ABNORMAL HIGH (ref 0.2–0.9)
Monocyte %: 9.6 %
Neutrophil #: 13.1 10*3/uL — ABNORMAL HIGH (ref 1.4–6.5)
Neutrophil %: 81 %
Platelet: 453 10*3/uL — ABNORMAL HIGH (ref 150–440)
RBC: 2.78 10*6/uL — ABNORMAL LOW (ref 3.80–5.20)
RDW: 14.5 % (ref 11.5–14.5)

## 2013-06-21 LAB — MAGNESIUM: Magnesium: 2.1 mg/dL

## 2013-06-21 LAB — PHOSPHORUS: Phosphorus: 4.2 mg/dL (ref 2.5–4.9)

## 2013-06-22 LAB — BASIC METABOLIC PANEL
Anion Gap: 5 — ABNORMAL LOW (ref 7–16)
BUN: 26 mg/dL — ABNORMAL HIGH (ref 7–18)
Co2: 29 mmol/L (ref 21–32)
EGFR (African American): >60
Glucose: 123 mg/dL — ABNORMAL HIGH (ref 65–99)
Potassium: 3.9 mmol/L (ref 3.5–5.1)
Sodium: 136 mmol/L (ref 136–145)

## 2013-06-22 LAB — PHOSPHORUS: Phosphorus: 3.7 mg/dL (ref 2.5–4.9)

## 2013-06-23 LAB — PHOSPHORUS: Phosphorus: 3.3 mg/dL (ref 2.5–4.9)

## 2013-06-24 LAB — PHOSPHORUS: Phosphorus: 2.5 mg/dL (ref 2.5–4.9)

## 2013-06-24 LAB — CALCIUM: Calcium, Total: 7.6 mg/dL — ABNORMAL LOW (ref 8.5–10.1)

## 2013-06-24 LAB — MAGNESIUM: Magnesium: 2 mg/dL

## 2013-06-26 DIAGNOSIS — K929 Disease of digestive system, unspecified: Secondary | ICD-10-CM | POA: Diagnosis present

## 2013-06-26 DIAGNOSIS — Z79899 Other long term (current) drug therapy: Secondary | ICD-10-CM | POA: Diagnosis not present

## 2013-06-26 DIAGNOSIS — R112 Nausea with vomiting, unspecified: Secondary | ICD-10-CM | POA: Diagnosis not present

## 2013-06-26 DIAGNOSIS — IMO0001 Reserved for inherently not codable concepts without codable children: Secondary | ICD-10-CM | POA: Diagnosis not present

## 2013-06-26 DIAGNOSIS — Z933 Colostomy status: Secondary | ICD-10-CM | POA: Diagnosis not present

## 2013-06-26 DIAGNOSIS — M79609 Pain in unspecified limb: Secondary | ICD-10-CM | POA: Diagnosis not present

## 2013-06-26 DIAGNOSIS — N200 Calculus of kidney: Secondary | ICD-10-CM | POA: Diagnosis not present

## 2013-06-26 DIAGNOSIS — R9431 Abnormal electrocardiogram [ECG] [EKG]: Secondary | ICD-10-CM | POA: Diagnosis not present

## 2013-06-26 DIAGNOSIS — Z9049 Acquired absence of other specified parts of digestive tract: Secondary | ICD-10-CM | POA: Diagnosis not present

## 2013-06-26 DIAGNOSIS — K56609 Unspecified intestinal obstruction, unspecified as to partial versus complete obstruction: Secondary | ICD-10-CM | POA: Diagnosis not present

## 2013-06-26 DIAGNOSIS — Z96649 Presence of unspecified artificial hip joint: Secondary | ICD-10-CM | POA: Diagnosis not present

## 2013-06-26 DIAGNOSIS — M129 Arthropathy, unspecified: Secondary | ICD-10-CM | POA: Diagnosis present

## 2013-06-26 DIAGNOSIS — Z880 Allergy status to penicillin: Secondary | ICD-10-CM | POA: Diagnosis not present

## 2013-06-26 DIAGNOSIS — N201 Calculus of ureter: Secondary | ICD-10-CM | POA: Diagnosis not present

## 2013-06-26 DIAGNOSIS — K5732 Diverticulitis of large intestine without perforation or abscess without bleeding: Secondary | ICD-10-CM | POA: Diagnosis not present

## 2013-06-26 DIAGNOSIS — Z96659 Presence of unspecified artificial knee joint: Secondary | ICD-10-CM | POA: Diagnosis not present

## 2013-06-26 DIAGNOSIS — Z433 Encounter for attention to colostomy: Secondary | ICD-10-CM | POA: Diagnosis not present

## 2013-06-26 DIAGNOSIS — Z980 Intestinal bypass and anastomosis status: Secondary | ICD-10-CM | POA: Diagnosis not present

## 2013-06-26 DIAGNOSIS — K659 Peritonitis, unspecified: Secondary | ICD-10-CM | POA: Diagnosis present

## 2013-06-26 DIAGNOSIS — F411 Generalized anxiety disorder: Secondary | ICD-10-CM | POA: Diagnosis present

## 2013-06-26 DIAGNOSIS — K560 Paralytic ileus: Secondary | ICD-10-CM | POA: Diagnosis present

## 2013-06-26 DIAGNOSIS — R0902 Hypoxemia: Secondary | ICD-10-CM | POA: Diagnosis not present

## 2013-06-26 DIAGNOSIS — R269 Unspecified abnormalities of gait and mobility: Secondary | ICD-10-CM | POA: Diagnosis not present

## 2013-06-26 DIAGNOSIS — Z6832 Body mass index (BMI) 32.0-32.9, adult: Secondary | ICD-10-CM | POA: Diagnosis not present

## 2013-06-26 DIAGNOSIS — E46 Unspecified protein-calorie malnutrition: Secondary | ICD-10-CM | POA: Diagnosis not present

## 2013-06-26 DIAGNOSIS — E43 Unspecified severe protein-calorie malnutrition: Secondary | ICD-10-CM | POA: Diagnosis present

## 2013-06-26 DIAGNOSIS — R109 Unspecified abdominal pain: Secondary | ICD-10-CM | POA: Diagnosis not present

## 2013-06-26 DIAGNOSIS — Z7982 Long term (current) use of aspirin: Secondary | ICD-10-CM | POA: Diagnosis not present

## 2013-06-26 LAB — CREATININE, SERUM
Creatinine: 0.91 mg/dL (ref 0.60–1.30)
EGFR (African American): >60
EGFR (Non-African Amer.): >60

## 2013-06-29 ENCOUNTER — Inpatient Hospital Stay: Payer: Self-pay | Admitting: Surgery

## 2013-06-29 ENCOUNTER — Encounter: Payer: Self-pay | Admitting: Internal Medicine

## 2013-06-29 DIAGNOSIS — Z96659 Presence of unspecified artificial knee joint: Secondary | ICD-10-CM | POA: Diagnosis not present

## 2013-06-29 DIAGNOSIS — N201 Calculus of ureter: Secondary | ICD-10-CM | POA: Diagnosis not present

## 2013-06-29 DIAGNOSIS — Z933 Colostomy status: Secondary | ICD-10-CM | POA: Diagnosis not present

## 2013-06-29 DIAGNOSIS — R110 Nausea: Secondary | ICD-10-CM | POA: Diagnosis not present

## 2013-06-29 DIAGNOSIS — F411 Generalized anxiety disorder: Secondary | ICD-10-CM | POA: Diagnosis not present

## 2013-06-29 DIAGNOSIS — Z880 Allergy status to penicillin: Secondary | ICD-10-CM | POA: Diagnosis not present

## 2013-06-29 DIAGNOSIS — Z6832 Body mass index (BMI) 32.0-32.9, adult: Secondary | ICD-10-CM | POA: Diagnosis not present

## 2013-06-29 DIAGNOSIS — Z79899 Other long term (current) drug therapy: Secondary | ICD-10-CM | POA: Diagnosis not present

## 2013-06-29 DIAGNOSIS — R112 Nausea with vomiting, unspecified: Secondary | ICD-10-CM | POA: Diagnosis not present

## 2013-06-29 DIAGNOSIS — K56609 Unspecified intestinal obstruction, unspecified as to partial versus complete obstruction: Secondary | ICD-10-CM | POA: Diagnosis not present

## 2013-06-29 DIAGNOSIS — K929 Disease of digestive system, unspecified: Secondary | ICD-10-CM | POA: Diagnosis not present

## 2013-06-29 DIAGNOSIS — Z96649 Presence of unspecified artificial hip joint: Secondary | ICD-10-CM | POA: Diagnosis not present

## 2013-06-29 DIAGNOSIS — K560 Paralytic ileus: Secondary | ICD-10-CM | POA: Diagnosis not present

## 2013-06-29 DIAGNOSIS — Z9049 Acquired absence of other specified parts of digestive tract: Secondary | ICD-10-CM | POA: Diagnosis not present

## 2013-06-29 DIAGNOSIS — E46 Unspecified protein-calorie malnutrition: Secondary | ICD-10-CM | POA: Diagnosis not present

## 2013-06-29 DIAGNOSIS — E43 Unspecified severe protein-calorie malnutrition: Secondary | ICD-10-CM | POA: Diagnosis present

## 2013-06-29 DIAGNOSIS — Z980 Intestinal bypass and anastomosis status: Secondary | ICD-10-CM | POA: Diagnosis not present

## 2013-06-29 DIAGNOSIS — M79609 Pain in unspecified limb: Secondary | ICD-10-CM | POA: Diagnosis not present

## 2013-06-29 DIAGNOSIS — R0902 Hypoxemia: Secondary | ICD-10-CM | POA: Diagnosis not present

## 2013-06-29 DIAGNOSIS — IMO0001 Reserved for inherently not codable concepts without codable children: Secondary | ICD-10-CM | POA: Diagnosis not present

## 2013-06-29 DIAGNOSIS — Z7982 Long term (current) use of aspirin: Secondary | ICD-10-CM | POA: Diagnosis not present

## 2013-06-29 DIAGNOSIS — J984 Other disorders of lung: Secondary | ICD-10-CM | POA: Diagnosis not present

## 2013-06-29 DIAGNOSIS — M129 Arthropathy, unspecified: Secondary | ICD-10-CM | POA: Diagnosis present

## 2013-06-29 DIAGNOSIS — N200 Calculus of kidney: Secondary | ICD-10-CM | POA: Diagnosis not present

## 2013-06-29 DIAGNOSIS — Z433 Encounter for attention to colostomy: Secondary | ICD-10-CM | POA: Diagnosis not present

## 2013-06-29 DIAGNOSIS — R109 Unspecified abdominal pain: Secondary | ICD-10-CM | POA: Diagnosis not present

## 2013-06-29 DIAGNOSIS — K5732 Diverticulitis of large intestine without perforation or abscess without bleeding: Secondary | ICD-10-CM | POA: Diagnosis not present

## 2013-06-29 DIAGNOSIS — R269 Unspecified abnormalities of gait and mobility: Secondary | ICD-10-CM | POA: Diagnosis not present

## 2013-06-29 DIAGNOSIS — R9431 Abnormal electrocardiogram [ECG] [EKG]: Secondary | ICD-10-CM | POA: Diagnosis not present

## 2013-06-29 DIAGNOSIS — R918 Other nonspecific abnormal finding of lung field: Secondary | ICD-10-CM | POA: Diagnosis not present

## 2013-06-29 DIAGNOSIS — N1330 Unspecified hydronephrosis: Secondary | ICD-10-CM | POA: Diagnosis not present

## 2013-06-29 DIAGNOSIS — K659 Peritonitis, unspecified: Secondary | ICD-10-CM | POA: Diagnosis not present

## 2013-06-29 LAB — URINALYSIS, COMPLETE
Bacteria: NONE SEEN
Glucose,UR: NEGATIVE mg/dL (ref 0–75)
Hyaline Cast: 5
Ketone: NEGATIVE
Leukocyte Esterase: NEGATIVE
Nitrite: NEGATIVE
Ph: 6 (ref 4.5–8.0)
Protein: 100
RBC,UR: 104 /HPF (ref 0–5)
Specific Gravity: 1.027 (ref 1.003–1.030)
Squamous Epithelial: <1

## 2013-06-29 LAB — COMPREHENSIVE METABOLIC PANEL
Alkaline Phosphatase: 127 U/L — ABNORMAL HIGH
Calcium, Total: 8.4 mg/dL — ABNORMAL LOW (ref 8.5–10.1)
Chloride: 101 mmol/L (ref 98–107)
Co2: 30 mmol/L (ref 21–32)
EGFR (African American): 60 — ABNORMAL LOW
EGFR (Non-African Amer.): 51 — ABNORMAL LOW
Glucose: 135 mg/dL — ABNORMAL HIGH (ref 65–99)
Osmolality: 284 (ref 275–301)
SGOT(AST): 31 U/L (ref 15–37)
SGPT (ALT): 26 U/L (ref 12–78)
Sodium: 139 mmol/L (ref 136–145)
Total Protein: 6.8 g/dL (ref 6.4–8.2)

## 2013-06-29 LAB — CBC WITH DIFFERENTIAL/PLATELET
Basophil #: 0.2 10*3/uL — ABNORMAL HIGH (ref 0.0–0.1)
Basophil %: 1.2 %
Eosinophil #: 0.1 10*3/uL (ref 0.0–0.7)
Eosinophil %: 0.6 %
Lymphocyte %: 11 %
MCH: 29.7 pg (ref 26.0–34.0)
MCHC: 33.2 g/dL (ref 32.0–36.0)
MCV: 89 fL (ref 80–100)
Monocyte #: 0.6 x10 3/mm (ref 0.2–0.9)
Monocyte %: 4.9 %
RBC: 3.29 10*6/uL — ABNORMAL LOW (ref 3.80–5.20)
RDW: 15.2 % — ABNORMAL HIGH (ref 11.5–14.5)

## 2013-06-29 LAB — TSH: Thyroid Stimulating Horm: 2.13 u[IU]/mL

## 2013-06-29 LAB — LIPASE, BLOOD: Lipase: 97 U/L (ref 73–393)

## 2013-06-30 LAB — COMPREHENSIVE METABOLIC PANEL
Alkaline Phosphatase: 101 U/L
Bilirubin,Total: 0.2 mg/dL (ref 0.2–1.0)
Chloride: 104 mmol/L (ref 98–107)
Co2: 31 mmol/L (ref 21–32)
Creatinine: 0.94 mg/dL (ref 0.60–1.30)
EGFR (African American): >60
Glucose: 101 mg/dL — ABNORMAL HIGH (ref 65–99)
Potassium: 3.7 mmol/L (ref 3.5–5.1)
SGOT(AST): 25 U/L (ref 15–37)
SGPT (ALT): 21 U/L (ref 12–78)
Total Protein: 5.6 g/dL — ABNORMAL LOW (ref 6.4–8.2)

## 2013-06-30 LAB — CBC WITH DIFFERENTIAL/PLATELET
Basophil %: 0.6 %
Lymphocyte #: 1.9 10*3/uL (ref 1.0–3.6)
MCH: 28.5 pg (ref 26.0–34.0)
MCHC: 31.6 g/dL — ABNORMAL LOW (ref 32.0–36.0)
MCV: 90 fL (ref 80–100)
Platelet: 612 10*3/uL — ABNORMAL HIGH (ref 150–440)
RDW: 15.6 % — ABNORMAL HIGH (ref 11.5–14.5)
WBC: 11.3 10*3/uL — ABNORMAL HIGH (ref 3.6–11.0)

## 2013-06-30 LAB — MAGNESIUM: Magnesium: 1.8 mg/dL

## 2013-07-01 LAB — CBC WITH DIFFERENTIAL/PLATELET
Basophil %: 0.6 %
Eosinophil #: 0.3 10*3/uL (ref 0.0–0.7)
Eosinophil %: 3.3 %
HGB: 7.6 g/dL — ABNORMAL LOW (ref 12.0–16.0)
Lymphocyte #: 1.9 10*3/uL (ref 1.0–3.6)
Lymphocyte %: 19.2 %
MCH: 29.1 pg (ref 26.0–34.0)
MCHC: 32.2 g/dL (ref 32.0–36.0)
Monocyte #: 0.7 x10 3/mm (ref 0.2–0.9)
Monocyte %: 7.2 %
Neutrophil #: 6.9 10*3/uL — ABNORMAL HIGH (ref 1.4–6.5)
Neutrophil %: 69.7 %
WBC: 9.8 10*3/uL (ref 3.6–11.0)

## 2013-07-02 LAB — CBC WITH DIFFERENTIAL/PLATELET
Eosinophil: 4 %
HCT: 23.4 % — ABNORMAL LOW (ref 35.0–47.0)
Lymphocytes: 19 %
MCHC: 33.3 g/dL (ref 32.0–36.0)
MCV: 90 fL (ref 80–100)
Monocytes: 4 %
Myelocyte: 1 %
Platelet: 574 10*3/uL — ABNORMAL HIGH (ref 150–440)
RDW: 15.4 % — ABNORMAL HIGH (ref 11.5–14.5)

## 2013-07-02 LAB — BASIC METABOLIC PANEL
Anion Gap: 5 — ABNORMAL LOW (ref 7–16)
BUN: 17 mg/dL (ref 7–18)
Co2: 28 mmol/L (ref 21–32)
Creatinine: 0.89 mg/dL (ref 0.60–1.30)
Glucose: 131 mg/dL — ABNORMAL HIGH (ref 65–99)
Osmolality: 285 (ref 275–301)

## 2013-07-02 LAB — URINE CULTURE

## 2013-07-02 LAB — ALBUMIN: Albumin: 1.3 g/dL — ABNORMAL LOW (ref 3.4–5.0)

## 2013-07-02 LAB — CALCIUM: Calcium, Total: 7.7 mg/dL — ABNORMAL LOW (ref 8.5–10.1)

## 2013-07-02 LAB — PHOSPHORUS: Phosphorus: 2.1 mg/dL — ABNORMAL LOW (ref 2.5–4.9)

## 2013-07-02 LAB — POTASSIUM: Potassium: 3.7 mmol/L (ref 3.5–5.1)

## 2013-07-03 LAB — CBC WITH DIFFERENTIAL/PLATELET
Comment - H1-Com3: NORMAL
Eosinophil: 2 %
HCT: 29.6 % — ABNORMAL LOW (ref 35.0–47.0)
HGB: 9.9 g/dL — ABNORMAL LOW (ref 12.0–16.0)
MCH: 29.6 pg (ref 26.0–34.0)
MCHC: 33.5 g/dL (ref 32.0–36.0)
Myelocyte: 2 %
Platelet: 562 10*3/uL — ABNORMAL HIGH (ref 150–440)
RBC: 3.35 10*6/uL — ABNORMAL LOW (ref 3.80–5.20)
WBC: 10.9 10*3/uL (ref 3.6–11.0)

## 2013-07-03 LAB — CALCIUM: Calcium, Total: 8.1 mg/dL — ABNORMAL LOW (ref 8.5–10.1)

## 2013-07-03 LAB — WOUND AEROBIC CULTURE

## 2013-07-03 LAB — SODIUM: Sodium: 138 mmol/L (ref 136–145)

## 2013-07-03 LAB — MAGNESIUM: Magnesium: 1.7 mg/dL — ABNORMAL LOW

## 2013-07-04 LAB — CALCIUM: Calcium, Total: 8 mg/dL — ABNORMAL LOW (ref 8.5–10.1)

## 2013-07-04 LAB — POTASSIUM: Potassium: 4.5 mmol/L (ref 3.5–5.1)

## 2013-07-04 LAB — CULTURE, BLOOD (SINGLE)

## 2013-07-04 LAB — ALBUMIN: Albumin: 1.7 g/dL — ABNORMAL LOW (ref 3.4–5.0)

## 2013-07-05 LAB — COMPREHENSIVE METABOLIC PANEL
Albumin: 1.7 g/dL — ABNORMAL LOW (ref 3.4–5.0)
Alkaline Phosphatase: 98 U/L
Anion Gap: 8 (ref 7–16)
Chloride: 102 mmol/L (ref 98–107)
Co2: 28 mmol/L (ref 21–32)
Creatinine: 0.95 mg/dL (ref 0.60–1.30)
EGFR (African American): >60
SGOT(AST): 32 U/L (ref 15–37)
SGPT (ALT): 29 U/L (ref 12–78)
Total Protein: 6.6 g/dL (ref 6.4–8.2)

## 2013-07-05 LAB — CBC WITH DIFFERENTIAL/PLATELET
Basophil: 2 %
Eosinophil: 1 %
HCT: 29.7 % — ABNORMAL LOW (ref 35.0–47.0)
HGB: 9.5 g/dL — ABNORMAL LOW (ref 12.0–16.0)
Lymphocytes: 12 %
MCH: 28.7 pg (ref 26.0–34.0)
MCHC: 32.1 g/dL (ref 32.0–36.0)
Platelet: 495 10*3/uL — ABNORMAL HIGH (ref 150–440)
RDW: 15.5 % — ABNORMAL HIGH (ref 11.5–14.5)
Segmented Neutrophils: 77 %

## 2013-07-05 LAB — PHOSPHORUS: Phosphorus: 3.5 mg/dL (ref 2.5–4.9)

## 2013-07-05 LAB — MAGNESIUM: Magnesium: 2 mg/dL

## 2013-07-05 LAB — SODIUM: Sodium: 138 mmol/L (ref 136–145)

## 2013-07-05 LAB — POTASSIUM: Potassium: 4.2 mmol/L (ref 3.5–5.1)

## 2013-07-05 LAB — CALCIUM: Calcium, Total: 8.3 mg/dL — ABNORMAL LOW (ref 8.5–10.1)

## 2013-07-06 LAB — CBC WITH DIFFERENTIAL/PLATELET
Basophil %: 0.8 %
Eosinophil #: 0.5 10*3/uL (ref 0.0–0.7)
Eosinophil %: 4.2 %
Lymphocyte %: 15 %
MCH: 29 pg (ref 26.0–34.0)
MCHC: 32.7 g/dL (ref 32.0–36.0)
MCV: 89 fL (ref 80–100)
Neutrophil #: 7.6 10*3/uL — ABNORMAL HIGH (ref 1.4–6.5)
Neutrophil %: 69.5 %
Platelet: 423 10*3/uL (ref 150–440)
RDW: 15.5 % — ABNORMAL HIGH (ref 11.5–14.5)

## 2013-07-06 LAB — PHOSPHORUS: Phosphorus: 3.4 mg/dL (ref 2.5–4.9)

## 2013-07-06 LAB — BASIC METABOLIC PANEL
Anion Gap: 7 (ref 7–16)
Calcium, Total: 8.4 mg/dL — ABNORMAL LOW (ref 8.5–10.1)
Chloride: 105 mmol/L (ref 98–107)
Co2: 25 mmol/L (ref 21–32)
Creatinine: 0.92 mg/dL (ref 0.60–1.30)
EGFR (Non-African Amer.): 60 — ABNORMAL LOW
Sodium: 137 mmol/L (ref 136–145)

## 2013-07-06 LAB — MAGNESIUM: Magnesium: 1.5 mg/dL — ABNORMAL LOW

## 2013-07-07 LAB — POTASSIUM: Potassium: 4.2 mmol/L (ref 3.5–5.1)

## 2013-07-07 LAB — PHOSPHORUS: Phosphorus: 3.7 mg/dL (ref 2.5–4.9)

## 2013-07-07 LAB — CALCIUM: Calcium, Total: 7.9 mg/dL — ABNORMAL LOW (ref 8.5–10.1)

## 2013-07-08 ENCOUNTER — Ambulatory Visit (HOSPITAL_COMMUNITY)
Admission: AD | Admit: 2013-07-08 | Discharge: 2013-07-08 | Disposition: A | Discharge status: Home / Self Care | Payer: Medicare Other | Source: Other Acute Inpatient Hospital | Attending: Internal Medicine | Admitting: Internal Medicine

## 2013-07-08 ENCOUNTER — Inpatient Hospital Stay
Admission: AD | Admit: 2013-07-08 | Discharge: 2013-08-13 | Disposition: A | Discharge status: Skilled Nursing Facility | Payer: Self-pay | Source: Ambulatory Visit | Attending: Internal Medicine | Admitting: Internal Medicine

## 2013-07-08 DIAGNOSIS — K5732 Diverticulitis of large intestine without perforation or abscess without bleeding: Secondary | ICD-10-CM | POA: Diagnosis not present

## 2013-07-08 DIAGNOSIS — M6281 Muscle weakness (generalized): Secondary | ICD-10-CM | POA: Diagnosis not present

## 2013-07-08 DIAGNOSIS — K560 Paralytic ileus: Secondary | ICD-10-CM | POA: Insufficient documentation

## 2013-07-08 DIAGNOSIS — G8911 Acute pain due to trauma: Secondary | ICD-10-CM | POA: Diagnosis not present

## 2013-07-08 DIAGNOSIS — E46 Unspecified protein-calorie malnutrition: Secondary | ICD-10-CM | POA: Diagnosis not present

## 2013-07-08 DIAGNOSIS — N1330 Unspecified hydronephrosis: Secondary | ICD-10-CM | POA: Diagnosis not present

## 2013-07-08 DIAGNOSIS — T80218A Other infection due to central venous catheter, initial encounter: Secondary | ICD-10-CM | POA: Diagnosis not present

## 2013-07-08 DIAGNOSIS — J189 Pneumonia, unspecified organism: Secondary | ICD-10-CM | POA: Diagnosis not present

## 2013-07-08 DIAGNOSIS — G9341 Metabolic encephalopathy: Secondary | ICD-10-CM | POA: Diagnosis not present

## 2013-07-08 DIAGNOSIS — G894 Chronic pain syndrome: Secondary | ICD-10-CM | POA: Diagnosis not present

## 2013-07-08 DIAGNOSIS — K631 Perforation of intestine (nontraumatic): Secondary | ICD-10-CM | POA: Diagnosis not present

## 2013-07-08 DIAGNOSIS — G8918 Other acute postprocedural pain: Secondary | ICD-10-CM | POA: Diagnosis present

## 2013-07-08 DIAGNOSIS — T8140XA Infection following a procedure, unspecified, initial encounter: Secondary | ICD-10-CM | POA: Diagnosis not present

## 2013-07-08 DIAGNOSIS — F321 Major depressive disorder, single episode, moderate: Secondary | ICD-10-CM | POA: Diagnosis not present

## 2013-07-08 DIAGNOSIS — S31109A Unspecified open wound of abdominal wall, unspecified quadrant without penetration into peritoneal cavity, initial encounter: Secondary | ICD-10-CM | POA: Diagnosis not present

## 2013-07-08 DIAGNOSIS — R935 Abnormal findings on diagnostic imaging of other abdominal regions, including retroperitoneum: Secondary | ICD-10-CM | POA: Diagnosis not present

## 2013-07-08 DIAGNOSIS — M545 Low back pain: Secondary | ICD-10-CM | POA: Diagnosis not present

## 2013-07-08 DIAGNOSIS — F341 Dysthymic disorder: Secondary | ICD-10-CM | POA: Diagnosis present

## 2013-07-08 DIAGNOSIS — J9600 Acute respiratory failure, unspecified whether with hypoxia or hypercapnia: Secondary | ICD-10-CM | POA: Diagnosis not present

## 2013-07-08 DIAGNOSIS — I10 Essential (primary) hypertension: Secondary | ICD-10-CM | POA: Diagnosis present

## 2013-07-08 DIAGNOSIS — F329 Major depressive disorder, single episode, unspecified: Secondary | ICD-10-CM | POA: Diagnosis not present

## 2013-07-08 DIAGNOSIS — T8131XA Disruption of external operation (surgical) wound, not elsewhere classified, initial encounter: Secondary | ICD-10-CM | POA: Diagnosis present

## 2013-07-08 DIAGNOSIS — J9801 Acute bronchospasm: Secondary | ICD-10-CM | POA: Diagnosis present

## 2013-07-08 DIAGNOSIS — R918 Other nonspecific abnormal finding of lung field: Secondary | ICD-10-CM | POA: Diagnosis not present

## 2013-07-08 DIAGNOSIS — K929 Disease of digestive system, unspecified: Secondary | ICD-10-CM | POA: Diagnosis present

## 2013-07-08 DIAGNOSIS — R509 Fever, unspecified: Secondary | ICD-10-CM | POA: Diagnosis not present

## 2013-07-08 DIAGNOSIS — J9819 Other pulmonary collapse: Secondary | ICD-10-CM | POA: Diagnosis not present

## 2013-07-08 DIAGNOSIS — K632 Fistula of intestine: Secondary | ICD-10-CM | POA: Diagnosis not present

## 2013-07-08 DIAGNOSIS — D638 Anemia in other chronic diseases classified elsewhere: Secondary | ICD-10-CM | POA: Diagnosis present

## 2013-07-08 DIAGNOSIS — E43 Unspecified severe protein-calorie malnutrition: Secondary | ICD-10-CM | POA: Diagnosis present

## 2013-07-08 DIAGNOSIS — F411 Generalized anxiety disorder: Secondary | ICD-10-CM | POA: Diagnosis not present

## 2013-07-08 DIAGNOSIS — K5730 Diverticulosis of large intestine without perforation or abscess without bleeding: Secondary | ICD-10-CM | POA: Diagnosis present

## 2013-07-08 DIAGNOSIS — N390 Urinary tract infection, site not specified: Secondary | ICD-10-CM | POA: Diagnosis not present

## 2013-07-08 DIAGNOSIS — N200 Calculus of kidney: Secondary | ICD-10-CM | POA: Diagnosis not present

## 2013-07-08 DIAGNOSIS — B957 Other staphylococcus as the cause of diseases classified elsewhere: Secondary | ICD-10-CM | POA: Diagnosis not present

## 2013-07-08 DIAGNOSIS — Z452 Encounter for adjustment and management of vascular access device: Secondary | ICD-10-CM | POA: Diagnosis not present

## 2013-07-08 DIAGNOSIS — E109 Type 1 diabetes mellitus without complications: Secondary | ICD-10-CM | POA: Diagnosis not present

## 2013-07-08 DIAGNOSIS — B3789 Other sites of candidiasis: Secondary | ICD-10-CM | POA: Diagnosis not present

## 2013-07-08 DIAGNOSIS — B49 Unspecified mycosis: Secondary | ICD-10-CM | POA: Diagnosis not present

## 2013-07-08 DIAGNOSIS — J8410 Pulmonary fibrosis, unspecified: Secondary | ICD-10-CM | POA: Diagnosis present

## 2013-07-08 LAB — MAGNESIUM: Magnesium: 1.3 mg/dL — ABNORMAL LOW

## 2013-07-09 ENCOUNTER — Other Ambulatory Visit (HOSPITAL_COMMUNITY): Payer: Self-pay

## 2013-07-09 DIAGNOSIS — J9819 Other pulmonary collapse: Secondary | ICD-10-CM | POA: Diagnosis not present

## 2013-07-09 DIAGNOSIS — R935 Abnormal findings on diagnostic imaging of other abdominal regions, including retroperitoneum: Secondary | ICD-10-CM | POA: Diagnosis not present

## 2013-07-09 DIAGNOSIS — K560 Paralytic ileus: Secondary | ICD-10-CM | POA: Diagnosis not present

## 2013-07-09 LAB — BASIC METABOLIC PANEL
BUN: 26 mg/dL — ABNORMAL HIGH (ref 6–23)
CO2: 25 mEq/L (ref 19–32)
Calcium: 8.4 mg/dL (ref 8.4–10.5)
Chloride: 101 mEq/L (ref 96–112)
Creatinine, Ser: 0.72 mg/dL (ref 0.50–1.10)
GFR calc non Af Amer: 80 mL/min — ABNORMAL LOW (ref >90)
Potassium: 4.2 mEq/L (ref 3.5–5.1)
Sodium: 136 mEq/L (ref 135–145)

## 2013-07-09 LAB — CBC WITH DIFFERENTIAL/PLATELET
Basophils Absolute: 0 10*3/uL (ref 0.0–0.1)
Basophils Relative: 0 % (ref 0–1)
Lymphocytes Relative: 20 % (ref 12–46)
MCHC: 31.2 g/dL (ref 30.0–36.0)
MCV: 92.1 fL (ref 78.0–100.0)
Neutro Abs: 4.6 10*3/uL (ref 1.7–7.7)
Neutrophils Relative %: 68 % (ref 43–77)
Platelets: 349 10*3/uL (ref 150–400)
RBC: 3.41 MIL/uL — ABNORMAL LOW (ref 3.87–5.11)
RDW: 15.6 % — ABNORMAL HIGH (ref 11.5–15.5)
WBC: 6.9 10*3/uL (ref 4.0–10.5)

## 2013-07-09 LAB — BLOOD GAS, ARTERIAL
FIO2: 0.28 %
O2 Saturation: 99.4 %
Patient temperature: 98.6
TCO2: 27.5 mmol/L (ref 0–100)
pCO2 arterial: 37.9 mmHg (ref 35.0–45.0)
pH, Arterial: 7.457 — ABNORMAL HIGH (ref 7.350–7.450)

## 2013-07-09 LAB — HEPATIC FUNCTION PANEL
ALT: 35 U/L (ref 0–35)
AST: 29 U/L (ref 0–37)
Alkaline Phosphatase: 127 U/L — ABNORMAL HIGH (ref 39–117)
Bilirubin, Direct: 0.1 mg/dL (ref 0.0–0.3)
Indirect Bilirubin: 0.2 mg/dL — ABNORMAL LOW (ref 0.3–0.9)
Total Protein: 6.8 g/dL (ref 6.0–8.3)

## 2013-07-09 MED FILL — Albuterol Sulfate Soln Nebu 0.083% (2.5 MG/3ML): RESPIRATORY_TRACT | Qty: 6 | Status: AC

## 2013-07-10 ENCOUNTER — Other Ambulatory Visit (HOSPITAL_COMMUNITY): Payer: Self-pay

## 2013-07-10 LAB — CBC
HCT: 24.8 % — ABNORMAL LOW (ref 36.0–46.0)
MCH: 29.7 pg (ref 26.0–34.0)
MCHC: 32.7 g/dL (ref 30.0–36.0)
MCV: 90.8 fL (ref 78.0–100.0)
Platelets: 319 10*3/uL (ref 150–400)
RBC: 2.73 MIL/uL — ABNORMAL LOW (ref 3.87–5.11)

## 2013-07-10 LAB — BASIC METABOLIC PANEL
CO2: 24 mEq/L (ref 19–32)
Calcium: 8.4 mg/dL (ref 8.4–10.5)
Creatinine, Ser: 0.67 mg/dL (ref 0.50–1.10)
GFR calc non Af Amer: 82 mL/min — ABNORMAL LOW (ref >90)
Glucose, Bld: 138 mg/dL — ABNORMAL HIGH (ref 70–99)

## 2013-07-13 ENCOUNTER — Encounter: Payer: Self-pay | Admitting: Radiology

## 2013-07-13 ENCOUNTER — Other Ambulatory Visit (HOSPITAL_COMMUNITY): Payer: Self-pay

## 2013-07-13 DIAGNOSIS — N200 Calculus of kidney: Secondary | ICD-10-CM | POA: Diagnosis not present

## 2013-07-13 LAB — COMPREHENSIVE METABOLIC PANEL
ALT: 63 U/L — ABNORMAL HIGH (ref 0–35)
AST: 52 U/L — ABNORMAL HIGH (ref 0–37)
Albumin: 1.8 g/dL — ABNORMAL LOW (ref 3.5–5.2)
CO2: 26 mEq/L (ref 19–32)
Calcium: 8.9 mg/dL (ref 8.4–10.5)
Chloride: 101 mEq/L (ref 96–112)
Creatinine, Ser: 0.71 mg/dL (ref 0.50–1.10)
GFR calc non Af Amer: 80 mL/min — ABNORMAL LOW (ref >90)
Glucose, Bld: 146 mg/dL — ABNORMAL HIGH (ref 70–99)
Sodium: 141 mEq/L (ref 135–145)
Total Protein: 7.5 g/dL (ref 6.0–8.3)

## 2013-07-13 LAB — CBC WITH DIFFERENTIAL/PLATELET
Basophils Absolute: 0.1 10*3/uL (ref 0.0–0.1)
Basophils Relative: 1 % (ref 0–1)
Eosinophils Relative: 9 % — ABNORMAL HIGH (ref 0–5)
HCT: 27.9 % — ABNORMAL LOW (ref 36.0–46.0)
Lymphocytes Relative: 24 % (ref 12–46)
Lymphs Abs: 2 10*3/uL (ref 0.7–4.0)
MCHC: 31.2 g/dL (ref 30.0–36.0)
MCV: 92.7 fL (ref 78.0–100.0)
Monocytes Absolute: 0.6 10*3/uL (ref 0.1–1.0)
Neutrophils Relative %: 60 % (ref 43–77)
RDW: 15.1 % (ref 11.5–15.5)
WBC: 8.5 10*3/uL (ref 4.0–10.5)

## 2013-07-13 LAB — TRIGLYCERIDES: Triglycerides: 188 mg/dL — ABNORMAL HIGH (ref <150)

## 2013-07-13 LAB — PREALBUMIN: Prealbumin: 16.4 mg/dL — ABNORMAL LOW (ref 17.0–34.0)

## 2013-07-13 LAB — MAGNESIUM: Magnesium: 1.9 mg/dL (ref 1.5–2.5)

## 2013-07-13 LAB — PHOSPHORUS: Phosphorus: 3.8 mg/dL (ref 2.3–4.6)

## 2013-07-13 MED ORDER — IOHEXOL 300 MG/ML  SOLN
80.0000 mL | Freq: Once | INTRAMUSCULAR | Status: AC | PRN
  Administered 2013-07-13: 80 mL via INTRAVENOUS

## 2013-07-15 LAB — CBC WITH DIFFERENTIAL/PLATELET
Basophils Relative: 2 % — ABNORMAL HIGH (ref 0–1)
Eosinophils Absolute: 1.1 10*3/uL — ABNORMAL HIGH (ref 0.0–0.7)
Eosinophils Relative: 11 % — ABNORMAL HIGH (ref 0–5)
HCT: 30.6 % — ABNORMAL LOW (ref 36.0–46.0)
Hemoglobin: 9.4 g/dL — ABNORMAL LOW (ref 12.0–15.0)
MCH: 28.8 pg (ref 26.0–34.0)
MCHC: 30.7 g/dL (ref 30.0–36.0)
Neutrophils Relative %: 57 % (ref 43–77)
Platelets: 510 10*3/uL — ABNORMAL HIGH (ref 150–400)
RDW: 15.9 % — ABNORMAL HIGH (ref 11.5–15.5)

## 2013-07-15 LAB — BASIC METABOLIC PANEL
BUN: 37 mg/dL — ABNORMAL HIGH (ref 6–23)
Calcium: 9.1 mg/dL (ref 8.4–10.5)
GFR calc Af Amer: >90 mL/min (ref >90)
GFR calc non Af Amer: 79 mL/min — ABNORMAL LOW (ref >90)
Potassium: 3.8 mEq/L (ref 3.5–5.1)
Sodium: 142 mEq/L (ref 135–145)

## 2013-07-16 LAB — CBC WITH DIFFERENTIAL/PLATELET
Basophils Relative: 1 % (ref 0–1)
Eosinophils Absolute: 0.5 10*3/uL (ref 0.0–0.7)
HCT: 31.9 % — ABNORMAL LOW (ref 36.0–46.0)
Hemoglobin: 10.3 g/dL — ABNORMAL LOW (ref 12.0–15.0)
Lymphocytes Relative: 16 % (ref 12–46)
MCHC: 32.3 g/dL (ref 30.0–36.0)
Monocytes Relative: 5 % (ref 3–12)
Neutro Abs: 7.7 10*3/uL (ref 1.7–7.7)
Neutrophils Relative %: 72 % (ref 43–77)
Platelets: 560 10*3/uL — ABNORMAL HIGH (ref 150–400)
RBC: 3.41 MIL/uL — ABNORMAL LOW (ref 3.87–5.11)

## 2013-07-16 LAB — BASIC METABOLIC PANEL
BUN: 34 mg/dL — ABNORMAL HIGH (ref 6–23)
CO2: 23 mEq/L (ref 19–32)
Chloride: 103 mEq/L (ref 96–112)
GFR calc Af Amer: >90 mL/min (ref >90)
GFR calc non Af Amer: 78 mL/min — ABNORMAL LOW (ref >90)
Potassium: 4.4 mEq/L (ref 3.5–5.1)
Sodium: 139 mEq/L (ref 135–145)

## 2013-07-17 LAB — CBC WITH DIFFERENTIAL/PLATELET
Basophils Relative: 1 % (ref 0–1)
HCT: 30.2 % — ABNORMAL LOW (ref 36.0–46.0)
Hemoglobin: 9.4 g/dL — ABNORMAL LOW (ref 12.0–15.0)
Lymphocytes Relative: 20 % (ref 12–46)
Lymphs Abs: 2 10*3/uL (ref 0.7–4.0)
MCH: 29.4 pg (ref 26.0–34.0)
MCHC: 31.1 g/dL (ref 30.0–36.0)
Monocytes Absolute: 0.6 10*3/uL (ref 0.1–1.0)
Monocytes Relative: 6 % (ref 3–12)
Neutro Abs: 6.3 10*3/uL (ref 1.7–7.7)
Neutrophils Relative %: 63 % (ref 43–77)
RBC: 3.2 MIL/uL — ABNORMAL LOW (ref 3.87–5.11)
WBC: 10.1 10*3/uL (ref 4.0–10.5)

## 2013-07-17 LAB — BASIC METABOLIC PANEL
BUN: 37 mg/dL — ABNORMAL HIGH (ref 6–23)
CO2: 26 mEq/L (ref 19–32)
Chloride: 106 mEq/L (ref 96–112)
Creatinine, Ser: 0.78 mg/dL (ref 0.50–1.10)
GFR calc Af Amer: >90 mL/min (ref >90)
Glucose, Bld: 146 mg/dL — ABNORMAL HIGH (ref 70–99)
Potassium: 4.2 mEq/L (ref 3.5–5.1)
Sodium: 141 mEq/L (ref 135–145)

## 2013-07-19 LAB — CBC
HCT: 30.4 % — ABNORMAL LOW (ref 36.0–46.0)
Hemoglobin: 9.5 g/dL — ABNORMAL LOW (ref 12.0–15.0)
MCHC: 31.3 g/dL (ref 30.0–36.0)
MCV: 94.1 fL (ref 78.0–100.0)
RDW: 16.6 % — ABNORMAL HIGH (ref 11.5–15.5)

## 2013-07-19 LAB — BASIC METABOLIC PANEL
BUN: 32 mg/dL — ABNORMAL HIGH (ref 6–23)
CO2: 26 mEq/L (ref 19–32)
Creatinine, Ser: 0.62 mg/dL (ref 0.50–1.10)
GFR calc Af Amer: >90 mL/min (ref >90)
GFR calc non Af Amer: 84 mL/min — ABNORMAL LOW (ref >90)
Glucose, Bld: 138 mg/dL — ABNORMAL HIGH (ref 70–99)
Potassium: 3.3 mEq/L — ABNORMAL LOW (ref 3.5–5.1)

## 2013-07-20 LAB — BASIC METABOLIC PANEL
BUN: 30 mg/dL — ABNORMAL HIGH (ref 6–23)
CO2: 28 mEq/L (ref 19–32)
Calcium: 9.2 mg/dL (ref 8.4–10.5)
Chloride: 104 mEq/L (ref 96–112)
Creatinine, Ser: 0.64 mg/dL (ref 0.50–1.10)
GFR calc Af Amer: >90 mL/min (ref >90)
Glucose, Bld: 142 mg/dL — ABNORMAL HIGH (ref 70–99)

## 2013-07-21 LAB — CBC
HCT: 32.4 % — ABNORMAL LOW (ref 36.0–46.0)
Hemoglobin: 10.4 g/dL — ABNORMAL LOW (ref 12.0–15.0)
MCHC: 32.1 g/dL (ref 30.0–36.0)
MCV: 93.6 fL (ref 78.0–100.0)
Platelets: 467 10*3/uL — ABNORMAL HIGH (ref 150–400)
RDW: 17 % — ABNORMAL HIGH (ref 11.5–15.5)

## 2013-07-21 LAB — BASIC METABOLIC PANEL
BUN: 30 mg/dL — ABNORMAL HIGH (ref 6–23)
Calcium: 9.4 mg/dL (ref 8.4–10.5)
Creatinine, Ser: 0.63 mg/dL (ref 0.50–1.10)
GFR calc Af Amer: >90 mL/min (ref >90)
GFR calc non Af Amer: 84 mL/min — ABNORMAL LOW (ref >90)
Potassium: 4.1 mEq/L (ref 3.5–5.1)

## 2013-07-22 ENCOUNTER — Other Ambulatory Visit (HOSPITAL_COMMUNITY): Payer: Self-pay

## 2013-07-22 DIAGNOSIS — J189 Pneumonia, unspecified organism: Secondary | ICD-10-CM | POA: Diagnosis not present

## 2013-07-22 LAB — CBC
HCT: 34.4 % — ABNORMAL LOW (ref 36.0–46.0)
MCH: 30.1 pg (ref 26.0–34.0)
MCHC: 31.7 g/dL (ref 30.0–36.0)
Platelets: 366 10*3/uL (ref 150–400)
RDW: 17.3 % — ABNORMAL HIGH (ref 11.5–15.5)
WBC: 9.8 10*3/uL (ref 4.0–10.5)

## 2013-07-23 LAB — BASIC METABOLIC PANEL
BUN: 32 mg/dL — ABNORMAL HIGH (ref 6–23)
CO2: 28 mEq/L (ref 19–32)
Calcium: 9.2 mg/dL (ref 8.4–10.5)
Chloride: 98 mEq/L (ref 96–112)
Creatinine, Ser: 0.64 mg/dL (ref 0.50–1.10)
Sodium: 136 mEq/L (ref 135–145)

## 2013-07-23 LAB — CBC WITH DIFFERENTIAL/PLATELET
Basophils Absolute: 0 10*3/uL (ref 0.0–0.1)
Basophils Relative: 0 % (ref 0–1)
Eosinophils Absolute: 0.1 10*3/uL (ref 0.0–0.7)
Eosinophils Relative: 1 % (ref 0–5)
HCT: 36.2 % (ref 36.0–46.0)
Lymphocytes Relative: 8 % — ABNORMAL LOW (ref 12–46)
MCH: 29.9 pg (ref 26.0–34.0)
MCHC: 31.5 g/dL (ref 30.0–36.0)
MCV: 95 fL (ref 78.0–100.0)
Monocytes Absolute: 0.7 10*3/uL (ref 0.1–1.0)
Monocytes Relative: 6 % (ref 3–12)
Platelets: 330 10*3/uL (ref 150–400)
RBC: 3.81 MIL/uL — ABNORMAL LOW (ref 3.87–5.11)
RDW: 17.3 % — ABNORMAL HIGH (ref 11.5–15.5)
WBC: 10.5 10*3/uL (ref 4.0–10.5)

## 2013-07-24 ENCOUNTER — Other Ambulatory Visit (HOSPITAL_COMMUNITY): Payer: Self-pay

## 2013-07-24 DIAGNOSIS — R918 Other nonspecific abnormal finding of lung field: Secondary | ICD-10-CM | POA: Diagnosis not present

## 2013-07-24 DIAGNOSIS — Z452 Encounter for adjustment and management of vascular access device: Secondary | ICD-10-CM | POA: Diagnosis not present

## 2013-07-24 DIAGNOSIS — N200 Calculus of kidney: Secondary | ICD-10-CM | POA: Diagnosis not present

## 2013-07-24 DIAGNOSIS — N1330 Unspecified hydronephrosis: Secondary | ICD-10-CM | POA: Diagnosis not present

## 2013-07-24 LAB — URINE CULTURE

## 2013-07-24 MED ORDER — IOHEXOL 300 MG/ML  SOLN: 25.0000 mL | INTRAMUSCULAR | Status: AC

## 2013-07-24 MED ORDER — IOHEXOL 300 MG/ML  SOLN
100.0000 mL | Freq: Once | INTRAMUSCULAR | Status: AC | PRN
  Administered 2013-07-24: 100 mL via INTRAVENOUS

## 2013-07-25 ENCOUNTER — Other Ambulatory Visit (HOSPITAL_COMMUNITY): Payer: Self-pay

## 2013-07-25 LAB — CBC WITH DIFFERENTIAL/PLATELET
Basophils Absolute: 0 10*3/uL (ref 0.0–0.1)
Eosinophils Absolute: 0.5 10*3/uL (ref 0.0–0.7)
Lymphocytes Relative: 9 % — ABNORMAL LOW (ref 12–46)
Lymphs Abs: 0.9 10*3/uL (ref 0.7–4.0)
Neutrophils Relative %: 78 % — ABNORMAL HIGH (ref 43–77)
Platelets: 233 10*3/uL (ref 150–400)
RBC: 3.38 MIL/uL — ABNORMAL LOW (ref 3.87–5.11)
WBC: 10 10*3/uL (ref 4.0–10.5)

## 2013-07-26 LAB — CULTURE, BLOOD (ROUTINE X 2)

## 2013-07-26 LAB — BASIC METABOLIC PANEL WITH GFR
BUN: 27 mg/dL — ABNORMAL HIGH (ref 6–23)
CO2: 28 meq/L (ref 19–32)
Calcium: 8.5 mg/dL (ref 8.4–10.5)
Chloride: 99 meq/L (ref 96–112)
Creatinine, Ser: 0.6 mg/dL (ref 0.50–1.10)
GFR calc Af Amer: >90 mL/min (ref >90)
GFR calc non Af Amer: 85 mL/min — ABNORMAL LOW (ref >90)
Glucose, Bld: 178 mg/dL — ABNORMAL HIGH (ref 70–99)
Potassium: 3 meq/L — ABNORMAL LOW (ref 3.5–5.1)
Sodium: 139 meq/L (ref 135–145)

## 2013-07-26 LAB — CBC
HCT: 33.7 % — ABNORMAL LOW (ref 36.0–46.0)
Hemoglobin: 10.9 g/dL — ABNORMAL LOW (ref 12.0–15.0)
MCH: 29.7 pg (ref 26.0–34.0)
MCHC: 32.3 g/dL (ref 30.0–36.0)
MCV: 91.8 fL (ref 78.0–100.0)
Platelets: 194 K/uL (ref 150–400)
RBC: 3.67 MIL/uL — ABNORMAL LOW (ref 3.87–5.11)
RDW: 17 % — ABNORMAL HIGH (ref 11.5–15.5)
WBC: 10.1 K/uL (ref 4.0–10.5)

## 2013-07-27 LAB — CBC WITH DIFFERENTIAL/PLATELET
Basophils Absolute: 0 10*3/uL (ref 0.0–0.1)
Basophils Relative: 0 % (ref 0–1)
Eosinophils Absolute: 0.2 10*3/uL (ref 0.0–0.7)
HCT: 32.9 % — ABNORMAL LOW (ref 36.0–46.0)
Hemoglobin: 10.6 g/dL — ABNORMAL LOW (ref 12.0–15.0)
Lymphocytes Relative: 12 % (ref 12–46)
MCHC: 32.2 g/dL (ref 30.0–36.0)
MCV: 91.4 fL (ref 78.0–100.0)
Monocytes Absolute: 1 10*3/uL (ref 0.1–1.0)
Monocytes Relative: 9 % (ref 3–12)
Neutro Abs: 8.2 10*3/uL — ABNORMAL HIGH (ref 1.7–7.7)
Neutrophils Relative %: 77 % (ref 43–77)
RBC: 3.6 MIL/uL — ABNORMAL LOW (ref 3.87–5.11)
RDW: 17.1 % — ABNORMAL HIGH (ref 11.5–15.5)
WBC: 10.7 10*3/uL — ABNORMAL HIGH (ref 4.0–10.5)

## 2013-07-27 LAB — TRIGLYCERIDES: Triglycerides: 168 mg/dL — ABNORMAL HIGH (ref <150)

## 2013-07-27 LAB — PREALBUMIN: Prealbumin: 12 mg/dL — ABNORMAL LOW (ref 17.0–34.0)

## 2013-07-27 LAB — COMPREHENSIVE METABOLIC PANEL
ALT: 29 U/L (ref 0–35)
Albumin: 2 g/dL — ABNORMAL LOW (ref 3.5–5.2)
Alkaline Phosphatase: 126 U/L — ABNORMAL HIGH (ref 39–117)
BUN: 26 mg/dL — ABNORMAL HIGH (ref 6–23)
Potassium: 2.8 mEq/L — ABNORMAL LOW (ref 3.5–5.1)
Sodium: 142 mEq/L (ref 135–145)
Total Protein: 6.9 g/dL (ref 6.0–8.3)

## 2013-07-28 LAB — CBC WITH DIFFERENTIAL/PLATELET
Basophils Relative: 1 % (ref 0–1)
Eosinophils Absolute: 0.7 10*3/uL (ref 0.0–0.7)
Eosinophils Relative: 5 % (ref 0–5)
HCT: 32.5 % — ABNORMAL LOW (ref 36.0–46.0)
Hemoglobin: 10.3 g/dL — ABNORMAL LOW (ref 12.0–15.0)
Lymphocytes Relative: 14 % (ref 12–46)
MCH: 29.4 pg (ref 26.0–34.0)
MCHC: 31.7 g/dL (ref 30.0–36.0)
Monocytes Relative: 6 % (ref 3–12)
Neutro Abs: 10.1 10*3/uL — ABNORMAL HIGH (ref 1.7–7.7)
Platelets: 198 10*3/uL (ref 150–400)
RBC: 3.5 MIL/uL — ABNORMAL LOW (ref 3.87–5.11)
WBC: 13.6 10*3/uL — ABNORMAL HIGH (ref 4.0–10.5)

## 2013-07-29 LAB — BASIC METABOLIC PANEL
BUN: 32 mg/dL — ABNORMAL HIGH (ref 6–23)
CO2: 28 mEq/L (ref 19–32)
Chloride: 104 mEq/L (ref 96–112)
Creatinine, Ser: 0.64 mg/dL (ref 0.50–1.10)
Glucose, Bld: 109 mg/dL — ABNORMAL HIGH (ref 70–99)
Potassium: 4 mEq/L (ref 3.7–5.3)

## 2013-07-29 LAB — CBC WITH DIFFERENTIAL/PLATELET
Basophils Relative: 1 % (ref 0–1)
Eosinophils Relative: 5 % (ref 0–5)
HCT: 33.1 % — ABNORMAL LOW (ref 36.0–46.0)
Hemoglobin: 10.5 g/dL — ABNORMAL LOW (ref 12.0–15.0)
Lymphs Abs: 2.3 10*3/uL (ref 0.7–4.0)
MCH: 29.2 pg (ref 26.0–34.0)
MCV: 91.9 fL (ref 78.0–100.0)
Monocytes Absolute: 0.9 10*3/uL (ref 0.1–1.0)
Neutro Abs: 11.1 10*3/uL — ABNORMAL HIGH (ref 1.7–7.7)
Platelets: 228 10*3/uL (ref 150–400)
RBC: 3.6 MIL/uL — ABNORMAL LOW (ref 3.87–5.11)

## 2013-07-29 LAB — PROCALCITONIN: Procalcitonin: 0.43 ng/mL

## 2013-07-30 LAB — RENAL FUNCTION PANEL
Albumin: 1.8 g/dL — ABNORMAL LOW (ref 3.5–5.2)
BUN: 28 mg/dL — ABNORMAL HIGH (ref 6–23)
CO2: 30 mEq/L (ref 19–32)
Calcium: 8.5 mg/dL (ref 8.4–10.5)
Chloride: 103 mEq/L (ref 96–112)
Creatinine, Ser: 0.53 mg/dL (ref 0.50–1.10)
GFR calc Af Amer: >90 mL/min (ref >90)
GFR calc non Af Amer: 89 mL/min — ABNORMAL LOW (ref >90)
Glucose, Bld: 194 mg/dL — ABNORMAL HIGH (ref 70–99)
Phosphorus: 3.4 mg/dL (ref 2.3–4.6)
Potassium: 3.6 mEq/L — ABNORMAL LOW (ref 3.7–5.3)
Sodium: 144 mEq/L (ref 137–147)

## 2013-07-30 LAB — CBC WITH DIFFERENTIAL/PLATELET
Basophils Absolute: 0.1 10*3/uL (ref 0.0–0.1)
Basophils Relative: 1 % (ref 0–1)
Eosinophils Absolute: 0.7 10*3/uL (ref 0.0–0.7)
Eosinophils Relative: 5 % (ref 0–5)
HCT: 31 % — ABNORMAL LOW (ref 36.0–46.0)
Hemoglobin: 9.6 g/dL — ABNORMAL LOW (ref 12.0–15.0)
Lymphocytes Relative: 15 % (ref 12–46)
Lymphs Abs: 2.2 10*3/uL (ref 0.7–4.0)
MCH: 29.1 pg (ref 26.0–34.0)
MCHC: 31 g/dL (ref 30.0–36.0)
MCV: 93.9 fL (ref 78.0–100.0)
Monocytes Absolute: 0.9 10*3/uL (ref 0.1–1.0)
Monocytes Relative: 6 % (ref 3–12)
Neutro Abs: 10.9 10*3/uL — ABNORMAL HIGH (ref 1.7–7.7)
Neutrophils Relative %: 73 % (ref 43–77)
Platelets: 233 10*3/uL (ref 150–400)
RBC: 3.3 MIL/uL — ABNORMAL LOW (ref 3.87–5.11)
RDW: 18 % — ABNORMAL HIGH (ref 11.5–15.5)
WBC: 14.8 10*3/uL — ABNORMAL HIGH (ref 4.0–10.5)

## 2013-07-31 LAB — BASIC METABOLIC PANEL
BUN: 26 mg/dL — ABNORMAL HIGH (ref 6–23)
CALCIUM: 8.6 mg/dL (ref 8.4–10.5)
CO2: 28 mEq/L (ref 19–32)
Chloride: 101 mEq/L (ref 96–112)
Creatinine, Ser: 0.52 mg/dL (ref 0.50–1.10)
GFR calc Af Amer: >90 mL/min (ref >90)
GFR calc non Af Amer: 89 mL/min — ABNORMAL LOW (ref >90)
GLUCOSE: 161 mg/dL — AB (ref 70–99)
Potassium: 3.8 mEq/L (ref 3.7–5.3)
Sodium: 144 mEq/L (ref 137–147)

## 2013-08-01 LAB — CBC
HEMATOCRIT: 31.1 % — AB (ref 36.0–46.0)
HEMOGLOBIN: 9.9 g/dL — AB (ref 12.0–15.0)
MCH: 29.7 pg (ref 26.0–34.0)
MCHC: 31.8 g/dL (ref 30.0–36.0)
MCV: 93.4 fL (ref 78.0–100.0)
Platelets: 257 10*3/uL (ref 150–400)
RBC: 3.33 MIL/uL — AB (ref 3.87–5.11)
RDW: 18 % — ABNORMAL HIGH (ref 11.5–15.5)
WBC: 14.3 10*3/uL — ABNORMAL HIGH (ref 4.0–10.5)

## 2013-08-01 LAB — BASIC METABOLIC PANEL
BUN: 27 mg/dL — AB (ref 6–23)
CHLORIDE: 102 meq/L (ref 96–112)
CO2: 30 meq/L (ref 19–32)
Calcium: 8.7 mg/dL (ref 8.4–10.5)
Creatinine, Ser: 0.54 mg/dL (ref 0.50–1.10)
GFR calc Af Amer: >90 mL/min (ref >90)
GFR, EST NON AFRICAN AMERICAN: 88 mL/min — AB (ref >90)
GLUCOSE: 180 mg/dL — AB (ref 70–99)
POTASSIUM: 3.4 meq/L — AB (ref 3.7–5.3)
SODIUM: 142 meq/L (ref 137–147)

## 2013-08-01 LAB — CULTURE, BLOOD (ROUTINE X 2)
Culture: NO GROWTH
Culture: NO GROWTH

## 2013-08-02 LAB — CBC
HCT: 31.9 % — ABNORMAL LOW (ref 36.0–46.0)
Hemoglobin: 9.9 g/dL — ABNORMAL LOW (ref 12.0–15.0)
MCH: 29.4 pg (ref 26.0–34.0)
MCHC: 31 g/dL (ref 30.0–36.0)
MCV: 94.7 fL (ref 78.0–100.0)
Platelets: 263 10*3/uL (ref 150–400)
RBC: 3.37 MIL/uL — ABNORMAL LOW (ref 3.87–5.11)
RDW: 18.4 % — AB (ref 11.5–15.5)
WBC: 12.9 10*3/uL — ABNORMAL HIGH (ref 4.0–10.5)

## 2013-08-02 LAB — BASIC METABOLIC PANEL
BUN: 30 mg/dL — AB (ref 6–23)
CO2: 28 meq/L (ref 19–32)
CREATININE: 0.6 mg/dL (ref 0.50–1.10)
Calcium: 8.6 mg/dL (ref 8.4–10.5)
Chloride: 103 mEq/L (ref 96–112)
GFR calc Af Amer: >90 mL/min (ref >90)
GFR calc non Af Amer: 85 mL/min — ABNORMAL LOW (ref >90)
GLUCOSE: 131 mg/dL — AB (ref 70–99)
POTASSIUM: 4.4 meq/L (ref 3.7–5.3)
Sodium: 140 mEq/L (ref 137–147)

## 2013-08-03 ENCOUNTER — Other Ambulatory Visit (HOSPITAL_COMMUNITY): Payer: Self-pay

## 2013-08-03 DIAGNOSIS — N1330 Unspecified hydronephrosis: Secondary | ICD-10-CM | POA: Diagnosis not present

## 2013-08-03 DIAGNOSIS — N200 Calculus of kidney: Secondary | ICD-10-CM | POA: Diagnosis not present

## 2013-08-03 LAB — CBC WITH DIFFERENTIAL/PLATELET
BASOS PCT: 0 % (ref 0–1)
Basophils Absolute: 0 10*3/uL (ref 0.0–0.1)
EOS ABS: 0.6 10*3/uL (ref 0.0–0.7)
Eosinophils Relative: 4 % (ref 0–5)
HCT: 32.7 % — ABNORMAL LOW (ref 36.0–46.0)
Hemoglobin: 10.2 g/dL — ABNORMAL LOW (ref 12.0–15.0)
Lymphocytes Relative: 15 % (ref 12–46)
Lymphs Abs: 2 10*3/uL (ref 0.7–4.0)
MCH: 29.1 pg (ref 26.0–34.0)
MCHC: 31.2 g/dL (ref 30.0–36.0)
MCV: 93.2 fL (ref 78.0–100.0)
Monocytes Absolute: 0.8 10*3/uL (ref 0.1–1.0)
Monocytes Relative: 6 % (ref 3–12)
NEUTROS ABS: 9.9 10*3/uL — AB (ref 1.7–7.7)
NEUTROS PCT: 74 % (ref 43–77)
Platelets: 316 10*3/uL (ref 150–400)
RBC: 3.51 MIL/uL — AB (ref 3.87–5.11)
RDW: 18.3 % — ABNORMAL HIGH (ref 11.5–15.5)
WBC: 13.4 10*3/uL — ABNORMAL HIGH (ref 4.0–10.5)

## 2013-08-03 LAB — COMPREHENSIVE METABOLIC PANEL
ALBUMIN: 1.9 g/dL — AB (ref 3.5–5.2)
ALT: 27 U/L (ref 0–35)
AST: 22 U/L (ref 0–37)
Alkaline Phosphatase: 105 U/L (ref 39–117)
BILIRUBIN TOTAL: 0.3 mg/dL (ref 0.3–1.2)
BUN: 33 mg/dL — AB (ref 6–23)
CO2: 28 mEq/L (ref 19–32)
CREATININE: 0.64 mg/dL (ref 0.50–1.10)
Calcium: 8.8 mg/dL (ref 8.4–10.5)
Chloride: 100 mEq/L (ref 96–112)
GFR calc Af Amer: >90 mL/min (ref >90)
GFR calc non Af Amer: 83 mL/min — ABNORMAL LOW (ref >90)
Glucose, Bld: 134 mg/dL — ABNORMAL HIGH (ref 70–99)
POTASSIUM: 4.5 meq/L (ref 3.7–5.3)
Sodium: 141 mEq/L (ref 137–147)
TOTAL PROTEIN: 7.6 g/dL (ref 6.0–8.3)

## 2013-08-03 LAB — CULTURE, BLOOD (ROUTINE X 2)
Culture: NO GROWTH
Culture: NO GROWTH

## 2013-08-03 LAB — PREALBUMIN: PREALBUMIN: 11.3 mg/dL — AB (ref 17.0–34.0)

## 2013-08-03 LAB — TRIGLYCERIDES: TRIGLYCERIDES: 172 mg/dL — AB (ref <150)

## 2013-08-03 LAB — MAGNESIUM: MAGNESIUM: 2.1 mg/dL (ref 1.5–2.5)

## 2013-08-03 LAB — PHOSPHORUS: PHOSPHORUS: 4.7 mg/dL — AB (ref 2.3–4.6)

## 2013-08-03 MED ORDER — IOHEXOL 300 MG/ML  SOLN
80.0000 mL | Freq: Once | INTRAMUSCULAR | Status: AC | PRN
  Administered 2013-08-03: 80 mL via INTRAVENOUS

## 2013-08-05 LAB — BASIC METABOLIC PANEL
BUN: 30 mg/dL — AB (ref 6–23)
CALCIUM: 8.9 mg/dL (ref 8.4–10.5)
CO2: 29 mEq/L (ref 19–32)
CREATININE: 0.64 mg/dL (ref 0.50–1.10)
Chloride: 98 mEq/L (ref 96–112)
GFR calc Af Amer: >90 mL/min (ref >90)
GFR, EST NON AFRICAN AMERICAN: 83 mL/min — AB (ref >90)
GLUCOSE: 138 mg/dL — AB (ref 70–99)
Potassium: 4.3 mEq/L (ref 3.7–5.3)
SODIUM: 138 meq/L (ref 137–147)

## 2013-08-05 LAB — CBC WITH DIFFERENTIAL/PLATELET
BASOS PCT: 0 % (ref 0–1)
Basophils Absolute: 0 10*3/uL (ref 0.0–0.1)
EOS ABS: 0.5 10*3/uL (ref 0.0–0.7)
Eosinophils Relative: 4 % (ref 0–5)
HCT: 30.7 % — ABNORMAL LOW (ref 36.0–46.0)
HEMOGLOBIN: 9.9 g/dL — AB (ref 12.0–15.0)
LYMPHS ABS: 1.5 10*3/uL (ref 0.7–4.0)
Lymphocytes Relative: 13 % (ref 12–46)
MCH: 29.9 pg (ref 26.0–34.0)
MCHC: 32.2 g/dL (ref 30.0–36.0)
MCV: 92.7 fL (ref 78.0–100.0)
MONO ABS: 0.7 10*3/uL (ref 0.1–1.0)
Monocytes Relative: 6 % (ref 3–12)
NEUTROS ABS: 8.7 10*3/uL — AB (ref 1.7–7.7)
Neutrophils Relative %: 77 % (ref 43–77)
Platelets: 351 10*3/uL (ref 150–400)
RBC: 3.31 MIL/uL — ABNORMAL LOW (ref 3.87–5.11)
RDW: 17.9 % — ABNORMAL HIGH (ref 11.5–15.5)
WBC: 11.4 10*3/uL — ABNORMAL HIGH (ref 4.0–10.5)

## 2013-08-06 LAB — BASIC METABOLIC PANEL
BUN: 29 mg/dL — ABNORMAL HIGH (ref 6–23)
CO2: 25 mEq/L (ref 19–32)
Calcium: 8.8 mg/dL (ref 8.4–10.5)
Chloride: 97 mEq/L (ref 96–112)
Creatinine, Ser: 0.64 mg/dL (ref 0.50–1.10)
GFR calc non Af Amer: 83 mL/min — ABNORMAL LOW (ref >90)
Glucose, Bld: 138 mg/dL — ABNORMAL HIGH (ref 70–99)
POTASSIUM: 3.9 meq/L (ref 3.7–5.3)
SODIUM: 136 meq/L — AB (ref 137–147)

## 2013-08-06 LAB — CBC WITH DIFFERENTIAL/PLATELET
BASOS PCT: 0 % (ref 0–1)
Basophils Absolute: 0 10*3/uL (ref 0.0–0.1)
EOS PCT: 3 % (ref 0–5)
Eosinophils Absolute: 0.4 10*3/uL (ref 0.0–0.7)
HCT: 28.1 % — ABNORMAL LOW (ref 36.0–46.0)
Hemoglobin: 9.3 g/dL — ABNORMAL LOW (ref 12.0–15.0)
Lymphocytes Relative: 15 % (ref 12–46)
Lymphs Abs: 1.6 10*3/uL (ref 0.7–4.0)
MCH: 30.5 pg (ref 26.0–34.0)
MCHC: 33.1 g/dL (ref 30.0–36.0)
MCV: 92.1 fL (ref 78.0–100.0)
Monocytes Absolute: 0.6 10*3/uL (ref 0.1–1.0)
Monocytes Relative: 5 % (ref 3–12)
NEUTROS PCT: 76 % (ref 43–77)
Neutro Abs: 8.2 10*3/uL — ABNORMAL HIGH (ref 1.7–7.7)
PLATELETS: 360 10*3/uL (ref 150–400)
RBC: 3.05 MIL/uL — ABNORMAL LOW (ref 3.87–5.11)
RDW: 18 % — AB (ref 11.5–15.5)
WBC: 10.8 10*3/uL — ABNORMAL HIGH (ref 4.0–10.5)

## 2013-08-10 LAB — CBC WITH DIFFERENTIAL/PLATELET
BASOS ABS: 0 10*3/uL (ref 0.0–0.1)
BASOS PCT: 1 % (ref 0–1)
Eosinophils Absolute: 0.1 10*3/uL (ref 0.0–0.7)
Eosinophils Relative: 2 % (ref 0–5)
HCT: 28.3 % — ABNORMAL LOW (ref 36.0–46.0)
Hemoglobin: 9.1 g/dL — ABNORMAL LOW (ref 12.0–15.0)
Lymphocytes Relative: 14 % (ref 12–46)
Lymphs Abs: 0.9 10*3/uL (ref 0.7–4.0)
MCH: 29.8 pg (ref 26.0–34.0)
MCHC: 32.2 g/dL (ref 30.0–36.0)
MCV: 92.8 fL (ref 78.0–100.0)
Monocytes Absolute: 0.4 10*3/uL (ref 0.1–1.0)
Monocytes Relative: 6 % (ref 3–12)
NEUTROS ABS: 5 10*3/uL (ref 1.7–7.7)
NEUTROS PCT: 78 % — AB (ref 43–77)
Platelets: 283 10*3/uL (ref 150–400)
RBC: 3.05 MIL/uL — ABNORMAL LOW (ref 3.87–5.11)
RDW: 18 % — ABNORMAL HIGH (ref 11.5–15.5)
WBC: 6.4 10*3/uL (ref 4.0–10.5)

## 2013-08-10 LAB — COMPREHENSIVE METABOLIC PANEL
ALT: 39 U/L — ABNORMAL HIGH (ref 0–35)
AST: 33 U/L (ref 0–37)
Albumin: 1.6 g/dL — ABNORMAL LOW (ref 3.5–5.2)
Alkaline Phosphatase: 109 U/L (ref 39–117)
BUN: 30 mg/dL — ABNORMAL HIGH (ref 6–23)
CO2: 29 meq/L (ref 19–32)
CREATININE: 0.68 mg/dL (ref 0.50–1.10)
Calcium: 8.5 mg/dL (ref 8.4–10.5)
Chloride: 100 mEq/L (ref 96–112)
GFR, EST NON AFRICAN AMERICAN: 82 mL/min — AB (ref >90)
GLUCOSE: 138 mg/dL — AB (ref 70–99)
Potassium: 3.6 mEq/L — ABNORMAL LOW (ref 3.7–5.3)
Sodium: 139 mEq/L (ref 137–147)
Total Bilirubin: <0.2 mg/dL — ABNORMAL LOW (ref 0.3–1.2)
Total Protein: 6.8 g/dL (ref 6.0–8.3)

## 2013-08-10 LAB — MAGNESIUM: Magnesium: 2.1 mg/dL (ref 1.5–2.5)

## 2013-08-10 LAB — PHOSPHORUS: PHOSPHORUS: 3.8 mg/dL (ref 2.3–4.6)

## 2013-08-10 LAB — TRIGLYCERIDES: Triglycerides: 211 mg/dL — ABNORMAL HIGH (ref <150)

## 2013-08-10 LAB — PREALBUMIN: PREALBUMIN: 11.7 mg/dL — AB (ref 17.0–34.0)

## 2013-08-11 LAB — POTASSIUM: POTASSIUM: 3.4 meq/L — AB (ref 3.7–5.3)

## 2013-08-12 LAB — BASIC METABOLIC PANEL
BUN: 28 mg/dL — ABNORMAL HIGH (ref 6–23)
CALCIUM: 8.7 mg/dL (ref 8.4–10.5)
CHLORIDE: 100 meq/L (ref 96–112)
CO2: 27 mEq/L (ref 19–32)
Creatinine, Ser: 0.62 mg/dL (ref 0.50–1.10)
GFR calc non Af Amer: 84 mL/min — ABNORMAL LOW (ref >90)
Glucose, Bld: 142 mg/dL — ABNORMAL HIGH (ref 70–99)
Potassium: 3.6 mEq/L — ABNORMAL LOW (ref 3.7–5.3)
SODIUM: 138 meq/L (ref 137–147)

## 2013-08-12 LAB — CBC WITH DIFFERENTIAL/PLATELET
BASOS ABS: 0.1 10*3/uL (ref 0.0–0.1)
Basophils Relative: 1 % (ref 0–1)
EOS PCT: 2 % (ref 0–5)
Eosinophils Absolute: 0.1 10*3/uL (ref 0.0–0.7)
HCT: 31.3 % — ABNORMAL LOW (ref 36.0–46.0)
Hemoglobin: 9.8 g/dL — ABNORMAL LOW (ref 12.0–15.0)
Lymphocytes Relative: 19 % (ref 12–46)
Lymphs Abs: 1.3 10*3/uL (ref 0.7–4.0)
MCH: 28.7 pg (ref 26.0–34.0)
MCHC: 31.3 g/dL (ref 30.0–36.0)
MCV: 91.8 fL (ref 78.0–100.0)
Monocytes Absolute: 0.5 10*3/uL (ref 0.1–1.0)
Monocytes Relative: 8 % (ref 3–12)
NEUTROS ABS: 4.7 10*3/uL (ref 1.7–7.7)
Neutrophils Relative %: 70 % (ref 43–77)
PLATELETS: 238 10*3/uL (ref 150–400)
RBC: 3.41 MIL/uL — ABNORMAL LOW (ref 3.87–5.11)
RDW: 18 % — AB (ref 11.5–15.5)
WBC: 6.7 10*3/uL (ref 4.0–10.5)

## 2013-08-13 DIAGNOSIS — R4182 Altered mental status, unspecified: Secondary | ICD-10-CM | POA: Diagnosis not present

## 2013-08-13 DIAGNOSIS — F329 Major depressive disorder, single episode, unspecified: Secondary | ICD-10-CM | POA: Diagnosis not present

## 2013-08-13 DIAGNOSIS — T80218A Other infection due to central venous catheter, initial encounter: Secondary | ICD-10-CM | POA: Diagnosis not present

## 2013-08-13 DIAGNOSIS — B957 Other staphylococcus as the cause of diseases classified elsewhere: Secondary | ICD-10-CM | POA: Diagnosis not present

## 2013-08-13 DIAGNOSIS — D638 Anemia in other chronic diseases classified elsewhere: Secondary | ICD-10-CM | POA: Diagnosis not present

## 2013-08-13 DIAGNOSIS — S31109A Unspecified open wound of abdominal wall, unspecified quadrant without penetration into peritoneal cavity, initial encounter: Secondary | ICD-10-CM | POA: Diagnosis not present

## 2013-08-13 DIAGNOSIS — Z0000 Encounter for general adult medical examination without abnormal findings: Secondary | ICD-10-CM | POA: Diagnosis not present

## 2013-08-13 DIAGNOSIS — E46 Unspecified protein-calorie malnutrition: Secondary | ICD-10-CM | POA: Diagnosis not present

## 2013-08-13 DIAGNOSIS — K631 Perforation of intestine (nontraumatic): Secondary | ICD-10-CM | POA: Diagnosis not present

## 2013-08-13 DIAGNOSIS — M545 Low back pain, unspecified: Secondary | ICD-10-CM | POA: Diagnosis not present

## 2013-08-13 DIAGNOSIS — F3289 Other specified depressive episodes: Secondary | ICD-10-CM | POA: Diagnosis not present

## 2013-08-13 DIAGNOSIS — G929 Unspecified toxic encephalopathy: Secondary | ICD-10-CM | POA: Diagnosis not present

## 2013-08-13 DIAGNOSIS — K5732 Diverticulitis of large intestine without perforation or abscess without bleeding: Secondary | ICD-10-CM | POA: Diagnosis not present

## 2013-08-13 DIAGNOSIS — N390 Urinary tract infection, site not specified: Secondary | ICD-10-CM | POA: Diagnosis not present

## 2013-08-13 DIAGNOSIS — K632 Fistula of intestine: Secondary | ICD-10-CM | POA: Diagnosis not present

## 2013-08-13 DIAGNOSIS — Z79899 Other long term (current) drug therapy: Secondary | ICD-10-CM | POA: Diagnosis not present

## 2013-08-13 DIAGNOSIS — R1084 Generalized abdominal pain: Secondary | ICD-10-CM | POA: Diagnosis not present

## 2013-08-13 DIAGNOSIS — E440 Moderate protein-calorie malnutrition: Secondary | ICD-10-CM | POA: Diagnosis not present

## 2013-08-13 DIAGNOSIS — R7881 Bacteremia: Secondary | ICD-10-CM | POA: Diagnosis not present

## 2013-08-13 DIAGNOSIS — E109 Type 1 diabetes mellitus without complications: Secondary | ICD-10-CM | POA: Diagnosis not present

## 2013-08-13 DIAGNOSIS — B49 Unspecified mycosis: Secondary | ICD-10-CM | POA: Diagnosis not present

## 2013-08-13 DIAGNOSIS — M6281 Muscle weakness (generalized): Secondary | ICD-10-CM | POA: Diagnosis not present

## 2013-08-13 DIAGNOSIS — G9341 Metabolic encephalopathy: Secondary | ICD-10-CM | POA: Diagnosis not present

## 2013-08-13 DIAGNOSIS — I10 Essential (primary) hypertension: Secondary | ICD-10-CM | POA: Diagnosis not present

## 2013-08-13 DIAGNOSIS — G92 Toxic encephalopathy: Secondary | ICD-10-CM | POA: Diagnosis not present

## 2013-08-13 DIAGNOSIS — J9600 Acute respiratory failure, unspecified whether with hypoxia or hypercapnia: Secondary | ICD-10-CM | POA: Diagnosis not present

## 2013-08-13 LAB — COMPREHENSIVE METABOLIC PANEL
ALT: 35 U/L (ref 0–35)
AST: 30 U/L (ref 0–37)
Albumin: 1.6 g/dL — ABNORMAL LOW (ref 3.5–5.2)
Alkaline Phosphatase: 99 U/L (ref 39–117)
BUN: 27 mg/dL — ABNORMAL HIGH (ref 6–23)
CO2: 25 mEq/L (ref 19–32)
Calcium: 8.3 mg/dL — ABNORMAL LOW (ref 8.4–10.5)
Chloride: 97 mEq/L (ref 96–112)
Creatinine, Ser: 0.53 mg/dL (ref 0.50–1.10)
GFR calc non Af Amer: 89 mL/min — ABNORMAL LOW (ref >90)
GLUCOSE: 164 mg/dL — AB (ref 70–99)
POTASSIUM: 4.2 meq/L (ref 3.7–5.3)
SODIUM: 134 meq/L — AB (ref 137–147)
TOTAL PROTEIN: 6.5 g/dL (ref 6.0–8.3)
Total Bilirubin: <0.2 mg/dL — ABNORMAL LOW (ref 0.3–1.2)

## 2013-08-13 LAB — MAGNESIUM: Magnesium: 1.6 mg/dL (ref 1.5–2.5)

## 2013-08-13 LAB — PHOSPHORUS: Phosphorus: 3.4 mg/dL (ref 2.3–4.6)

## 2013-08-18 ENCOUNTER — Inpatient Hospital Stay: Payer: Self-pay | Admitting: Internal Medicine

## 2013-08-18 DIAGNOSIS — G9341 Metabolic encephalopathy: Secondary | ICD-10-CM | POA: Diagnosis not present

## 2013-08-18 DIAGNOSIS — E109 Type 1 diabetes mellitus without complications: Secondary | ICD-10-CM | POA: Diagnosis not present

## 2013-08-18 DIAGNOSIS — F29 Unspecified psychosis not due to a substance or known physiological condition: Secondary | ICD-10-CM | POA: Diagnosis not present

## 2013-08-18 DIAGNOSIS — N1330 Unspecified hydronephrosis: Secondary | ICD-10-CM | POA: Diagnosis not present

## 2013-08-18 DIAGNOSIS — N390 Urinary tract infection, site not specified: Secondary | ICD-10-CM | POA: Diagnosis not present

## 2013-08-18 DIAGNOSIS — G8929 Other chronic pain: Secondary | ICD-10-CM | POA: Diagnosis present

## 2013-08-18 DIAGNOSIS — I10 Essential (primary) hypertension: Secondary | ICD-10-CM | POA: Diagnosis not present

## 2013-08-18 DIAGNOSIS — B957 Other staphylococcus as the cause of diseases classified elsewhere: Secondary | ICD-10-CM | POA: Diagnosis present

## 2013-08-18 DIAGNOSIS — M549 Dorsalgia, unspecified: Secondary | ICD-10-CM | POA: Diagnosis present

## 2013-08-18 DIAGNOSIS — R4182 Altered mental status, unspecified: Secondary | ICD-10-CM | POA: Diagnosis not present

## 2013-08-18 DIAGNOSIS — A498 Other bacterial infections of unspecified site: Secondary | ICD-10-CM | POA: Diagnosis present

## 2013-08-18 DIAGNOSIS — R911 Solitary pulmonary nodule: Secondary | ICD-10-CM | POA: Diagnosis not present

## 2013-08-18 DIAGNOSIS — F3289 Other specified depressive episodes: Secondary | ICD-10-CM | POA: Diagnosis not present

## 2013-08-18 DIAGNOSIS — E860 Dehydration: Secondary | ICD-10-CM | POA: Diagnosis not present

## 2013-08-18 DIAGNOSIS — M5137 Other intervertebral disc degeneration, lumbosacral region: Secondary | ICD-10-CM | POA: Diagnosis not present

## 2013-08-18 DIAGNOSIS — R7881 Bacteremia: Secondary | ICD-10-CM | POA: Diagnosis not present

## 2013-08-18 DIAGNOSIS — K632 Fistula of intestine: Secondary | ICD-10-CM | POA: Diagnosis not present

## 2013-08-18 DIAGNOSIS — D638 Anemia in other chronic diseases classified elsewhere: Secondary | ICD-10-CM | POA: Diagnosis present

## 2013-08-18 DIAGNOSIS — N201 Calculus of ureter: Secondary | ICD-10-CM | POA: Diagnosis not present

## 2013-08-18 DIAGNOSIS — E669 Obesity, unspecified: Secondary | ICD-10-CM | POA: Diagnosis present

## 2013-08-18 DIAGNOSIS — Z87891 Personal history of nicotine dependence: Secondary | ICD-10-CM | POA: Diagnosis not present

## 2013-08-18 DIAGNOSIS — F329 Major depressive disorder, single episode, unspecified: Secondary | ICD-10-CM | POA: Diagnosis present

## 2013-08-18 DIAGNOSIS — Z933 Colostomy status: Secondary | ICD-10-CM | POA: Diagnosis not present

## 2013-08-18 DIAGNOSIS — M545 Low back pain, unspecified: Secondary | ICD-10-CM | POA: Diagnosis not present

## 2013-08-18 DIAGNOSIS — T80218A Other infection due to central venous catheter, initial encounter: Secondary | ICD-10-CM | POA: Diagnosis not present

## 2013-08-18 DIAGNOSIS — R55 Syncope and collapse: Secondary | ICD-10-CM | POA: Diagnosis not present

## 2013-08-18 DIAGNOSIS — M6281 Muscle weakness (generalized): Secondary | ICD-10-CM | POA: Diagnosis not present

## 2013-08-18 DIAGNOSIS — N3000 Acute cystitis without hematuria: Secondary | ICD-10-CM | POA: Diagnosis not present

## 2013-08-18 DIAGNOSIS — G929 Unspecified toxic encephalopathy: Secondary | ICD-10-CM | POA: Diagnosis not present

## 2013-08-18 DIAGNOSIS — R5381 Other malaise: Secondary | ICD-10-CM | POA: Diagnosis not present

## 2013-08-18 DIAGNOSIS — J9600 Acute respiratory failure, unspecified whether with hypoxia or hypercapnia: Secondary | ICD-10-CM | POA: Diagnosis not present

## 2013-08-18 DIAGNOSIS — Z6831 Body mass index (BMI) 31.0-31.9, adult: Secondary | ICD-10-CM | POA: Diagnosis not present

## 2013-08-18 DIAGNOSIS — Z96659 Presence of unspecified artificial knee joint: Secondary | ICD-10-CM | POA: Diagnosis not present

## 2013-08-18 DIAGNOSIS — M1990 Unspecified osteoarthritis, unspecified site: Secondary | ICD-10-CM | POA: Diagnosis not present

## 2013-08-18 DIAGNOSIS — E440 Moderate protein-calorie malnutrition: Secondary | ICD-10-CM | POA: Diagnosis present

## 2013-08-18 DIAGNOSIS — G92 Toxic encephalopathy: Secondary | ICD-10-CM | POA: Diagnosis not present

## 2013-08-18 LAB — URINALYSIS, COMPLETE
Bilirubin,UR: NEGATIVE
Glucose,UR: NEGATIVE mg/dL (ref 0–75)
Ketone: NEGATIVE
NITRITE: POSITIVE
Ph: 5 (ref 4.5–8.0)
RBC,UR: 30 /HPF (ref 0–5)
Specific Gravity: 1.038 (ref 1.003–1.030)
Squamous Epithelial: 4

## 2013-08-18 LAB — CBC
HCT: 34.1 % — ABNORMAL LOW (ref 35.0–47.0)
HGB: 11 g/dL — AB (ref 12.0–16.0)
MCH: 28.8 pg (ref 26.0–34.0)
MCHC: 32.2 g/dL (ref 32.0–36.0)
MCV: 89 fL (ref 80–100)
Platelet: 341 10*3/uL (ref 150–440)
RBC: 3.82 10*6/uL (ref 3.80–5.20)
RDW: 19.6 % — ABNORMAL HIGH (ref 11.5–14.5)
WBC: 15 10*3/uL — ABNORMAL HIGH (ref 3.6–11.0)

## 2013-08-18 LAB — LIPASE, BLOOD: LIPASE: 70 U/L — AB (ref 73–393)

## 2013-08-18 LAB — COMPREHENSIVE METABOLIC PANEL
ALBUMIN: 2 g/dL — AB (ref 3.4–5.0)
ALT: 50 U/L (ref 12–78)
AST: 31 U/L (ref 15–37)
Alkaline Phosphatase: 137 U/L — ABNORMAL HIGH
Anion Gap: 4 — ABNORMAL LOW (ref 7–16)
BUN: 30 mg/dL — ABNORMAL HIGH (ref 7–18)
Bilirubin,Total: 0.4 mg/dL (ref 0.2–1.0)
CHLORIDE: 98 mmol/L (ref 98–107)
Calcium, Total: 8.8 mg/dL (ref 8.5–10.1)
Co2: 29 mmol/L (ref 21–32)
Creatinine: 0.82 mg/dL (ref 0.60–1.30)
EGFR (African American): >60
EGFR (Non-African Amer.): >60
Glucose: 123 mg/dL — ABNORMAL HIGH (ref 65–99)
Osmolality: 270 (ref 275–301)
Potassium: 4.2 mmol/L (ref 3.5–5.1)
Sodium: 131 mmol/L — ABNORMAL LOW (ref 136–145)
TOTAL PROTEIN: 8.4 g/dL — AB (ref 6.4–8.2)

## 2013-08-18 LAB — CALCIUM: CALCIUM: 7.7 mg/dL — AB (ref 8.5–10.1)

## 2013-08-18 LAB — MAGNESIUM: Magnesium: 1.7 mg/dL — ABNORMAL LOW

## 2013-08-18 LAB — PHOSPHORUS: Phosphorus: 4.4 mg/dL (ref 2.5–4.9)

## 2013-08-18 LAB — SODIUM: Sodium: 135 mmol/L — ABNORMAL LOW (ref 136–145)

## 2013-08-18 LAB — TROPONIN I: Troponin-I: <0.02 ng/mL

## 2013-08-18 LAB — POTASSIUM: Potassium: 4.3 mmol/L (ref 3.5–5.1)

## 2013-08-19 LAB — CBC WITH DIFFERENTIAL/PLATELET
BASOS ABS: 0 10*3/uL (ref 0.0–0.1)
Basophil %: 0.3 %
EOS ABS: 0.2 10*3/uL (ref 0.0–0.7)
EOS PCT: 2 %
HCT: 25.8 % — ABNORMAL LOW (ref 35.0–47.0)
HGB: 8.4 g/dL — AB (ref 12.0–16.0)
Lymphocyte #: 0.8 10*3/uL — ABNORMAL LOW (ref 1.0–3.6)
Lymphocyte %: 8.1 %
MCH: 29.4 pg (ref 26.0–34.0)
MCHC: 32.6 g/dL (ref 32.0–36.0)
MCV: 90 fL (ref 80–100)
Monocyte #: 0.5 x10 3/mm (ref 0.2–0.9)
Monocyte %: 5.3 %
NEUTROS ABS: 8.4 10*3/uL — AB (ref 1.4–6.5)
Neutrophil %: 84.3 %
PLATELETS: 251 10*3/uL (ref 150–440)
RBC: 2.85 10*6/uL — AB (ref 3.80–5.20)
RDW: 19.1 % — AB (ref 11.5–14.5)
WBC: 10 10*3/uL (ref 3.6–11.0)

## 2013-08-20 LAB — BASIC METABOLIC PANEL
Anion Gap: 4 — ABNORMAL LOW (ref 7–16)
BUN: 20 mg/dL — ABNORMAL HIGH (ref 7–18)
CALCIUM: 8.2 mg/dL — AB (ref 8.5–10.1)
CHLORIDE: 101 mmol/L (ref 98–107)
CO2: 29 mmol/L (ref 21–32)
Creatinine: 0.69 mg/dL (ref 0.60–1.30)
Glucose: 142 mg/dL — ABNORMAL HIGH (ref 65–99)
OSMOLALITY: 273 (ref 275–301)
Potassium: 3.9 mmol/L (ref 3.5–5.1)
SODIUM: 134 mmol/L — AB (ref 136–145)

## 2013-08-20 LAB — CULTURE, BLOOD (ROUTINE X 2)

## 2013-08-20 LAB — FUNGAL SUSCEPTIBILITY

## 2013-08-20 LAB — MAGNESIUM: Magnesium: 1.7 mg/dL — ABNORMAL LOW

## 2013-08-20 LAB — CULTURE, BLOOD (SINGLE)

## 2013-08-20 LAB — PHOSPHORUS: Phosphorus: 2.8 mg/dL (ref 2.5–4.9)

## 2013-08-22 LAB — CBC WITH DIFFERENTIAL/PLATELET
Basophil #: 0.1 10*3/uL (ref 0.0–0.1)
Basophil %: 1.3 %
EOS ABS: 0.5 10*3/uL (ref 0.0–0.7)
Eosinophil %: 4.9 %
HCT: 27.6 % — ABNORMAL LOW (ref 35.0–47.0)
HGB: 9 g/dL — AB (ref 12.0–16.0)
Lymphocyte #: 1.6 10*3/uL (ref 1.0–3.6)
Lymphocyte %: 16.8 %
MCH: 29.2 pg (ref 26.0–34.0)
MCHC: 32.8 g/dL (ref 32.0–36.0)
MCV: 89 fL (ref 80–100)
Monocyte #: 0.6 x10 3/mm (ref 0.2–0.9)
Monocyte %: 6.2 %
Neutrophil #: 6.5 10*3/uL (ref 1.4–6.5)
Neutrophil %: 70.8 %
Platelet: 377 10*3/uL (ref 150–440)
RBC: 3.1 10*6/uL — ABNORMAL LOW (ref 3.80–5.20)
RDW: 19.3 % — ABNORMAL HIGH (ref 11.5–14.5)
WBC: 9.3 10*3/uL (ref 3.6–11.0)

## 2013-08-22 LAB — VANCOMYCIN, TROUGH: Vancomycin, Trough: 16 ug/mL (ref 10–20)

## 2013-08-23 LAB — CREATININE, SERUM
CREATININE: 0.75 mg/dL (ref 0.60–1.30)
EGFR (African American): >60
EGFR (Non-African Amer.): >60

## 2013-08-24 DIAGNOSIS — IMO0001 Reserved for inherently not codable concepts without codable children: Secondary | ICD-10-CM | POA: Diagnosis not present

## 2013-08-24 DIAGNOSIS — R5381 Other malaise: Secondary | ICD-10-CM | POA: Diagnosis not present

## 2013-08-24 DIAGNOSIS — E440 Moderate protein-calorie malnutrition: Secondary | ICD-10-CM | POA: Diagnosis not present

## 2013-08-24 DIAGNOSIS — R55 Syncope and collapse: Secondary | ICD-10-CM | POA: Diagnosis not present

## 2013-08-24 DIAGNOSIS — N390 Urinary tract infection, site not specified: Secondary | ICD-10-CM | POA: Diagnosis not present

## 2013-08-24 DIAGNOSIS — M545 Low back pain, unspecified: Secondary | ICD-10-CM | POA: Diagnosis not present

## 2013-08-24 DIAGNOSIS — M1990 Unspecified osteoarthritis, unspecified site: Secondary | ICD-10-CM | POA: Diagnosis not present

## 2013-08-24 DIAGNOSIS — G929 Unspecified toxic encephalopathy: Secondary | ICD-10-CM | POA: Diagnosis not present

## 2013-08-24 DIAGNOSIS — Z79899 Other long term (current) drug therapy: Secondary | ICD-10-CM | POA: Diagnosis not present

## 2013-08-24 DIAGNOSIS — F3289 Other specified depressive episodes: Secondary | ICD-10-CM | POA: Diagnosis not present

## 2013-08-24 DIAGNOSIS — M6281 Muscle weakness (generalized): Secondary | ICD-10-CM | POA: Diagnosis not present

## 2013-08-24 DIAGNOSIS — R7881 Bacteremia: Secondary | ICD-10-CM | POA: Diagnosis not present

## 2013-08-24 DIAGNOSIS — T80218A Other infection due to central venous catheter, initial encounter: Secondary | ICD-10-CM | POA: Diagnosis not present

## 2013-08-24 DIAGNOSIS — B957 Other staphylococcus as the cause of diseases classified elsewhere: Secondary | ICD-10-CM | POA: Diagnosis not present

## 2013-08-24 DIAGNOSIS — I119 Hypertensive heart disease without heart failure: Secondary | ICD-10-CM | POA: Diagnosis not present

## 2013-08-24 DIAGNOSIS — I10 Essential (primary) hypertension: Secondary | ICD-10-CM | POA: Diagnosis not present

## 2013-08-24 DIAGNOSIS — S0120XA Unspecified open wound of nose, initial encounter: Secondary | ICD-10-CM | POA: Diagnosis not present

## 2013-08-24 DIAGNOSIS — G92 Toxic encephalopathy: Secondary | ICD-10-CM | POA: Diagnosis not present

## 2013-08-24 DIAGNOSIS — E109 Type 1 diabetes mellitus without complications: Secondary | ICD-10-CM | POA: Diagnosis not present

## 2013-08-24 DIAGNOSIS — R300 Dysuria: Secondary | ICD-10-CM | POA: Diagnosis not present

## 2013-08-24 DIAGNOSIS — R5383 Other fatigue: Secondary | ICD-10-CM | POA: Diagnosis not present

## 2013-08-24 DIAGNOSIS — E860 Dehydration: Secondary | ICD-10-CM | POA: Diagnosis not present

## 2013-08-24 DIAGNOSIS — Z0000 Encounter for general adult medical examination without abnormal findings: Secondary | ICD-10-CM | POA: Diagnosis not present

## 2013-08-24 DIAGNOSIS — R4182 Altered mental status, unspecified: Secondary | ICD-10-CM | POA: Diagnosis not present

## 2013-08-24 DIAGNOSIS — E46 Unspecified protein-calorie malnutrition: Secondary | ICD-10-CM | POA: Diagnosis not present

## 2013-08-24 DIAGNOSIS — G9341 Metabolic encephalopathy: Secondary | ICD-10-CM | POA: Diagnosis not present

## 2013-08-24 DIAGNOSIS — D638 Anemia in other chronic diseases classified elsewhere: Secondary | ICD-10-CM | POA: Diagnosis not present

## 2013-08-24 DIAGNOSIS — F329 Major depressive disorder, single episode, unspecified: Secondary | ICD-10-CM | POA: Diagnosis not present

## 2013-08-24 DIAGNOSIS — N3000 Acute cystitis without hematuria: Secondary | ICD-10-CM | POA: Diagnosis not present

## 2013-08-24 DIAGNOSIS — K632 Fistula of intestine: Secondary | ICD-10-CM | POA: Diagnosis not present

## 2013-08-24 LAB — URINE CULTURE

## 2013-08-25 LAB — CULTURE, BLOOD (SINGLE)

## 2013-08-26 LAB — CULTURE, BLOOD (SINGLE)

## 2013-09-18 DIAGNOSIS — F329 Major depressive disorder, single episode, unspecified: Secondary | ICD-10-CM | POA: Diagnosis not present

## 2013-09-18 DIAGNOSIS — F3289 Other specified depressive episodes: Secondary | ICD-10-CM | POA: Diagnosis not present

## 2013-09-18 DIAGNOSIS — I119 Hypertensive heart disease without heart failure: Secondary | ICD-10-CM | POA: Diagnosis not present

## 2013-09-18 DIAGNOSIS — IMO0001 Reserved for inherently not codable concepts without codable children: Secondary | ICD-10-CM | POA: Diagnosis not present

## 2013-11-12 DIAGNOSIS — F3289 Other specified depressive episodes: Secondary | ICD-10-CM | POA: Diagnosis not present

## 2013-11-12 DIAGNOSIS — E440 Moderate protein-calorie malnutrition: Secondary | ICD-10-CM | POA: Diagnosis not present

## 2013-11-12 DIAGNOSIS — F329 Major depressive disorder, single episode, unspecified: Secondary | ICD-10-CM | POA: Diagnosis not present

## 2013-11-12 DIAGNOSIS — M159 Polyosteoarthritis, unspecified: Secondary | ICD-10-CM | POA: Diagnosis not present

## 2013-11-12 DIAGNOSIS — D649 Anemia, unspecified: Secondary | ICD-10-CM | POA: Diagnosis not present

## 2013-11-12 DIAGNOSIS — Z87440 Personal history of urinary (tract) infections: Secondary | ICD-10-CM | POA: Diagnosis not present

## 2013-11-12 DIAGNOSIS — I10 Essential (primary) hypertension: Secondary | ICD-10-CM | POA: Diagnosis not present

## 2013-11-12 DIAGNOSIS — Z433 Encounter for attention to colostomy: Secondary | ICD-10-CM | POA: Diagnosis not present

## 2013-11-12 DIAGNOSIS — S60929A Unspecified superficial injury of unspecified hand, initial encounter: Secondary | ICD-10-CM | POA: Diagnosis not present

## 2013-11-17 DIAGNOSIS — F3289 Other specified depressive episodes: Secondary | ICD-10-CM | POA: Diagnosis not present

## 2013-11-17 DIAGNOSIS — D649 Anemia, unspecified: Secondary | ICD-10-CM | POA: Diagnosis not present

## 2013-11-17 DIAGNOSIS — Z433 Encounter for attention to colostomy: Secondary | ICD-10-CM | POA: Diagnosis not present

## 2013-11-17 DIAGNOSIS — I10 Essential (primary) hypertension: Secondary | ICD-10-CM | POA: Diagnosis not present

## 2013-11-17 DIAGNOSIS — F329 Major depressive disorder, single episode, unspecified: Secondary | ICD-10-CM | POA: Diagnosis not present

## 2013-11-17 DIAGNOSIS — S60929A Unspecified superficial injury of unspecified hand, initial encounter: Secondary | ICD-10-CM | POA: Diagnosis not present

## 2013-11-17 DIAGNOSIS — M159 Polyosteoarthritis, unspecified: Secondary | ICD-10-CM | POA: Diagnosis not present

## 2013-11-18 DIAGNOSIS — F3289 Other specified depressive episodes: Secondary | ICD-10-CM | POA: Diagnosis not present

## 2013-11-18 DIAGNOSIS — S60929A Unspecified superficial injury of unspecified hand, initial encounter: Secondary | ICD-10-CM | POA: Diagnosis not present

## 2013-11-18 DIAGNOSIS — I10 Essential (primary) hypertension: Secondary | ICD-10-CM | POA: Diagnosis not present

## 2013-11-18 DIAGNOSIS — Z433 Encounter for attention to colostomy: Secondary | ICD-10-CM | POA: Diagnosis not present

## 2013-11-18 DIAGNOSIS — D649 Anemia, unspecified: Secondary | ICD-10-CM | POA: Diagnosis not present

## 2013-11-18 DIAGNOSIS — F329 Major depressive disorder, single episode, unspecified: Secondary | ICD-10-CM | POA: Diagnosis not present

## 2013-11-18 DIAGNOSIS — M159 Polyosteoarthritis, unspecified: Secondary | ICD-10-CM | POA: Diagnosis not present

## 2013-11-19 DIAGNOSIS — M159 Polyosteoarthritis, unspecified: Secondary | ICD-10-CM | POA: Diagnosis not present

## 2013-11-19 DIAGNOSIS — D649 Anemia, unspecified: Secondary | ICD-10-CM | POA: Diagnosis not present

## 2013-11-19 DIAGNOSIS — Z433 Encounter for attention to colostomy: Secondary | ICD-10-CM | POA: Diagnosis not present

## 2013-11-19 DIAGNOSIS — S60929A Unspecified superficial injury of unspecified hand, initial encounter: Secondary | ICD-10-CM | POA: Diagnosis not present

## 2013-11-19 DIAGNOSIS — F329 Major depressive disorder, single episode, unspecified: Secondary | ICD-10-CM | POA: Diagnosis not present

## 2013-11-19 DIAGNOSIS — F3289 Other specified depressive episodes: Secondary | ICD-10-CM | POA: Diagnosis not present

## 2013-11-19 DIAGNOSIS — I10 Essential (primary) hypertension: Secondary | ICD-10-CM | POA: Diagnosis not present

## 2013-11-20 DIAGNOSIS — M353 Polymyalgia rheumatica: Secondary | ICD-10-CM | POA: Diagnosis not present

## 2013-11-20 DIAGNOSIS — G4700 Insomnia, unspecified: Secondary | ICD-10-CM | POA: Diagnosis not present

## 2013-11-20 DIAGNOSIS — I10 Essential (primary) hypertension: Secondary | ICD-10-CM | POA: Diagnosis not present

## 2013-11-20 DIAGNOSIS — F329 Major depressive disorder, single episode, unspecified: Secondary | ICD-10-CM | POA: Diagnosis not present

## 2013-11-20 DIAGNOSIS — F3289 Other specified depressive episodes: Secondary | ICD-10-CM | POA: Diagnosis not present

## 2013-11-23 DIAGNOSIS — Z433 Encounter for attention to colostomy: Secondary | ICD-10-CM | POA: Diagnosis not present

## 2013-11-23 DIAGNOSIS — F3289 Other specified depressive episodes: Secondary | ICD-10-CM | POA: Diagnosis not present

## 2013-11-23 DIAGNOSIS — I10 Essential (primary) hypertension: Secondary | ICD-10-CM | POA: Diagnosis not present

## 2013-11-23 DIAGNOSIS — M159 Polyosteoarthritis, unspecified: Secondary | ICD-10-CM | POA: Diagnosis not present

## 2013-11-23 DIAGNOSIS — S60929A Unspecified superficial injury of unspecified hand, initial encounter: Secondary | ICD-10-CM | POA: Diagnosis not present

## 2013-11-23 DIAGNOSIS — D649 Anemia, unspecified: Secondary | ICD-10-CM | POA: Diagnosis not present

## 2013-11-23 DIAGNOSIS — F329 Major depressive disorder, single episode, unspecified: Secondary | ICD-10-CM | POA: Diagnosis not present

## 2013-11-24 DIAGNOSIS — F329 Major depressive disorder, single episode, unspecified: Secondary | ICD-10-CM | POA: Diagnosis not present

## 2013-11-24 DIAGNOSIS — D649 Anemia, unspecified: Secondary | ICD-10-CM | POA: Diagnosis not present

## 2013-11-24 DIAGNOSIS — M159 Polyosteoarthritis, unspecified: Secondary | ICD-10-CM | POA: Diagnosis not present

## 2013-11-24 DIAGNOSIS — I10 Essential (primary) hypertension: Secondary | ICD-10-CM | POA: Diagnosis not present

## 2013-11-24 DIAGNOSIS — S60929A Unspecified superficial injury of unspecified hand, initial encounter: Secondary | ICD-10-CM | POA: Diagnosis not present

## 2013-11-24 DIAGNOSIS — F3289 Other specified depressive episodes: Secondary | ICD-10-CM | POA: Diagnosis not present

## 2013-11-24 DIAGNOSIS — Z433 Encounter for attention to colostomy: Secondary | ICD-10-CM | POA: Diagnosis not present

## 2013-11-25 DIAGNOSIS — M159 Polyosteoarthritis, unspecified: Secondary | ICD-10-CM | POA: Diagnosis not present

## 2013-11-25 DIAGNOSIS — I10 Essential (primary) hypertension: Secondary | ICD-10-CM | POA: Diagnosis not present

## 2013-11-25 DIAGNOSIS — F329 Major depressive disorder, single episode, unspecified: Secondary | ICD-10-CM | POA: Diagnosis not present

## 2013-11-25 DIAGNOSIS — Z433 Encounter for attention to colostomy: Secondary | ICD-10-CM | POA: Diagnosis not present

## 2013-11-25 DIAGNOSIS — D649 Anemia, unspecified: Secondary | ICD-10-CM | POA: Diagnosis not present

## 2013-11-25 DIAGNOSIS — S60929A Unspecified superficial injury of unspecified hand, initial encounter: Secondary | ICD-10-CM | POA: Diagnosis not present

## 2013-11-25 DIAGNOSIS — F3289 Other specified depressive episodes: Secondary | ICD-10-CM | POA: Diagnosis not present

## 2013-11-26 DIAGNOSIS — S60929A Unspecified superficial injury of unspecified hand, initial encounter: Secondary | ICD-10-CM | POA: Diagnosis not present

## 2013-11-26 DIAGNOSIS — I10 Essential (primary) hypertension: Secondary | ICD-10-CM | POA: Diagnosis not present

## 2013-11-26 DIAGNOSIS — Z433 Encounter for attention to colostomy: Secondary | ICD-10-CM | POA: Diagnosis not present

## 2013-11-26 DIAGNOSIS — M159 Polyosteoarthritis, unspecified: Secondary | ICD-10-CM | POA: Diagnosis not present

## 2013-11-26 DIAGNOSIS — F3289 Other specified depressive episodes: Secondary | ICD-10-CM | POA: Diagnosis not present

## 2013-11-26 DIAGNOSIS — D649 Anemia, unspecified: Secondary | ICD-10-CM | POA: Diagnosis not present

## 2013-11-26 DIAGNOSIS — F329 Major depressive disorder, single episode, unspecified: Secondary | ICD-10-CM | POA: Diagnosis not present

## 2013-11-30 DIAGNOSIS — R300 Dysuria: Secondary | ICD-10-CM | POA: Diagnosis not present

## 2013-11-30 DIAGNOSIS — F3289 Other specified depressive episodes: Secondary | ICD-10-CM | POA: Diagnosis not present

## 2013-11-30 DIAGNOSIS — F329 Major depressive disorder, single episode, unspecified: Secondary | ICD-10-CM | POA: Diagnosis not present

## 2013-11-30 DIAGNOSIS — D649 Anemia, unspecified: Secondary | ICD-10-CM | POA: Diagnosis not present

## 2013-11-30 DIAGNOSIS — Z87440 Personal history of urinary (tract) infections: Secondary | ICD-10-CM | POA: Diagnosis not present

## 2013-11-30 DIAGNOSIS — M159 Polyosteoarthritis, unspecified: Secondary | ICD-10-CM | POA: Diagnosis not present

## 2013-11-30 DIAGNOSIS — I10 Essential (primary) hypertension: Secondary | ICD-10-CM | POA: Diagnosis not present

## 2013-11-30 DIAGNOSIS — Z433 Encounter for attention to colostomy: Secondary | ICD-10-CM | POA: Diagnosis not present

## 2013-11-30 DIAGNOSIS — S60929A Unspecified superficial injury of unspecified hand, initial encounter: Secondary | ICD-10-CM | POA: Diagnosis not present

## 2013-12-01 ENCOUNTER — Inpatient Hospital Stay: Payer: Self-pay | Admitting: Internal Medicine

## 2013-12-01 DIAGNOSIS — F329 Major depressive disorder, single episode, unspecified: Secondary | ICD-10-CM | POA: Diagnosis present

## 2013-12-01 DIAGNOSIS — G9340 Encephalopathy, unspecified: Secondary | ICD-10-CM | POA: Diagnosis not present

## 2013-12-01 DIAGNOSIS — Z96649 Presence of unspecified artificial hip joint: Secondary | ICD-10-CM | POA: Diagnosis not present

## 2013-12-01 DIAGNOSIS — Z8249 Family history of ischemic heart disease and other diseases of the circulatory system: Secondary | ICD-10-CM | POA: Diagnosis not present

## 2013-12-01 DIAGNOSIS — R5381 Other malaise: Secondary | ICD-10-CM | POA: Diagnosis not present

## 2013-12-01 DIAGNOSIS — R079 Chest pain, unspecified: Secondary | ICD-10-CM | POA: Diagnosis not present

## 2013-12-01 DIAGNOSIS — M069 Rheumatoid arthritis, unspecified: Secondary | ICD-10-CM | POA: Diagnosis present

## 2013-12-01 DIAGNOSIS — R5383 Other fatigue: Secondary | ICD-10-CM | POA: Diagnosis not present

## 2013-12-01 DIAGNOSIS — Z933 Colostomy status: Secondary | ICD-10-CM | POA: Diagnosis not present

## 2013-12-01 DIAGNOSIS — N1330 Unspecified hydronephrosis: Secondary | ICD-10-CM | POA: Diagnosis present

## 2013-12-01 DIAGNOSIS — R0602 Shortness of breath: Secondary | ICD-10-CM | POA: Diagnosis not present

## 2013-12-01 DIAGNOSIS — N200 Calculus of kidney: Secondary | ICD-10-CM | POA: Diagnosis not present

## 2013-12-01 DIAGNOSIS — D72829 Elevated white blood cell count, unspecified: Secondary | ICD-10-CM | POA: Diagnosis present

## 2013-12-01 DIAGNOSIS — R918 Other nonspecific abnormal finding of lung field: Secondary | ICD-10-CM | POA: Diagnosis not present

## 2013-12-01 DIAGNOSIS — R4182 Altered mental status, unspecified: Secondary | ICD-10-CM | POA: Diagnosis not present

## 2013-12-01 DIAGNOSIS — Z880 Allergy status to penicillin: Secondary | ICD-10-CM | POA: Diagnosis not present

## 2013-12-01 DIAGNOSIS — Z803 Family history of malignant neoplasm of breast: Secondary | ICD-10-CM | POA: Diagnosis not present

## 2013-12-01 DIAGNOSIS — Z9889 Other specified postprocedural states: Secondary | ICD-10-CM | POA: Diagnosis not present

## 2013-12-01 DIAGNOSIS — R443 Hallucinations, unspecified: Secondary | ICD-10-CM | POA: Diagnosis not present

## 2013-12-01 DIAGNOSIS — Z452 Encounter for adjustment and management of vascular access device: Secondary | ICD-10-CM | POA: Diagnosis not present

## 2013-12-01 DIAGNOSIS — Z96659 Presence of unspecified artificial knee joint: Secondary | ICD-10-CM | POA: Diagnosis not present

## 2013-12-01 DIAGNOSIS — N135 Crossing vessel and stricture of ureter without hydronephrosis: Secondary | ICD-10-CM | POA: Diagnosis not present

## 2013-12-01 DIAGNOSIS — R109 Unspecified abdominal pain: Secondary | ICD-10-CM | POA: Diagnosis not present

## 2013-12-01 DIAGNOSIS — R404 Transient alteration of awareness: Secondary | ICD-10-CM | POA: Diagnosis present

## 2013-12-01 DIAGNOSIS — R9431 Abnormal electrocardiogram [ECG] [EKG]: Secondary | ICD-10-CM | POA: Diagnosis not present

## 2013-12-01 DIAGNOSIS — H9190 Unspecified hearing loss, unspecified ear: Secondary | ICD-10-CM | POA: Diagnosis present

## 2013-12-01 DIAGNOSIS — F3289 Other specified depressive episodes: Secondary | ICD-10-CM | POA: Diagnosis not present

## 2013-12-01 DIAGNOSIS — E876 Hypokalemia: Secondary | ICD-10-CM | POA: Diagnosis not present

## 2013-12-01 DIAGNOSIS — Z79899 Other long term (current) drug therapy: Secondary | ICD-10-CM | POA: Diagnosis not present

## 2013-12-01 DIAGNOSIS — IMO0002 Reserved for concepts with insufficient information to code with codable children: Secondary | ICD-10-CM | POA: Diagnosis not present

## 2013-12-01 DIAGNOSIS — I10 Essential (primary) hypertension: Secondary | ICD-10-CM | POA: Diagnosis not present

## 2013-12-01 LAB — TROPONIN I
TROPONIN-I: 0.03 ng/mL
Troponin-I: 0.02 ng/mL

## 2013-12-01 LAB — CBC
HCT: 36.6 % (ref 35.0–47.0)
HGB: 12 g/dL (ref 12.0–16.0)
MCH: 29.3 pg (ref 26.0–34.0)
MCHC: 32.8 g/dL (ref 32.0–36.0)
MCV: 89 fL (ref 80–100)
Platelet: 343 10*3/uL (ref 150–440)
RBC: 4.1 10*6/uL (ref 3.80–5.20)
RDW: 18 % — AB (ref 11.5–14.5)
WBC: 12 10*3/uL — ABNORMAL HIGH (ref 3.6–11.0)

## 2013-12-01 LAB — URINALYSIS, COMPLETE
BACTERIA: NONE SEEN
Bilirubin,UR: NEGATIVE
GLUCOSE, UR: NEGATIVE mg/dL (ref 0–75)
KETONE: NEGATIVE
Leukocyte Esterase: NEGATIVE
Nitrite: NEGATIVE
PH: 7 (ref 4.5–8.0)
Protein: 100
Specific Gravity: 1.011 (ref 1.003–1.030)
Squamous Epithelial: <1
WBC UR: 1 /HPF (ref 0–5)

## 2013-12-01 LAB — COMPREHENSIVE METABOLIC PANEL
AST: 27 U/L (ref 15–37)
Albumin: 3.2 g/dL — ABNORMAL LOW (ref 3.4–5.0)
Alkaline Phosphatase: 76 U/L
Anion Gap: 6 — ABNORMAL LOW (ref 7–16)
BILIRUBIN TOTAL: 0.3 mg/dL (ref 0.2–1.0)
BUN: 12 mg/dL (ref 7–18)
CREATININE: 0.89 mg/dL (ref 0.60–1.30)
Calcium, Total: 9 mg/dL (ref 8.5–10.1)
Chloride: 98 mmol/L (ref 98–107)
Co2: 30 mmol/L (ref 21–32)
EGFR (African American): >60
Glucose: 106 mg/dL — ABNORMAL HIGH (ref 65–99)
OSMOLALITY: 268 (ref 275–301)
Potassium: 2.9 mmol/L — ABNORMAL LOW (ref 3.5–5.1)
SGPT (ALT): 19 U/L (ref 12–78)
Sodium: 134 mmol/L — ABNORMAL LOW (ref 136–145)
Total Protein: 7.9 g/dL (ref 6.4–8.2)

## 2013-12-01 LAB — PROTIME-INR
INR: 1
Prothrombin Time: 13.2 secs (ref 11.5–14.7)

## 2013-12-01 LAB — MAGNESIUM: MAGNESIUM: 1.7 mg/dL — AB

## 2013-12-02 DIAGNOSIS — R0602 Shortness of breath: Secondary | ICD-10-CM | POA: Diagnosis not present

## 2013-12-02 DIAGNOSIS — E876 Hypokalemia: Secondary | ICD-10-CM | POA: Diagnosis not present

## 2013-12-02 DIAGNOSIS — I10 Essential (primary) hypertension: Secondary | ICD-10-CM | POA: Diagnosis not present

## 2013-12-02 DIAGNOSIS — R109 Unspecified abdominal pain: Secondary | ICD-10-CM | POA: Diagnosis not present

## 2013-12-02 DIAGNOSIS — G9340 Encephalopathy, unspecified: Secondary | ICD-10-CM | POA: Diagnosis not present

## 2013-12-02 DIAGNOSIS — R443 Hallucinations, unspecified: Secondary | ICD-10-CM | POA: Diagnosis not present

## 2013-12-02 LAB — TSH: THYROID STIMULATING HORM: 3.41 u[IU]/mL

## 2013-12-02 LAB — CBC WITH DIFFERENTIAL/PLATELET
Basophil #: 0.1 10*3/uL (ref 0.0–0.1)
Basophil %: 0.6 %
Eosinophil #: 0.1 10*3/uL (ref 0.0–0.7)
Eosinophil %: 1.1 %
HCT: 33.7 % — AB (ref 35.0–47.0)
HGB: 11.1 g/dL — ABNORMAL LOW (ref 12.0–16.0)
Lymphocyte #: 1.5 10*3/uL (ref 1.0–3.6)
Lymphocyte %: 17.6 %
MCH: 29.4 pg (ref 26.0–34.0)
MCHC: 32.9 g/dL (ref 32.0–36.0)
MCV: 89 fL (ref 80–100)
MONO ABS: 0.7 x10 3/mm (ref 0.2–0.9)
Monocyte %: 8.2 %
NEUTROS PCT: 72.5 %
Neutrophil #: 6.2 10*3/uL (ref 1.4–6.5)
Platelet: 288 10*3/uL (ref 150–440)
RBC: 3.77 10*6/uL — AB (ref 3.80–5.20)
RDW: 17.7 % — AB (ref 11.5–14.5)
WBC: 8.5 10*3/uL (ref 3.6–11.0)

## 2013-12-02 LAB — COMPREHENSIVE METABOLIC PANEL
AST: 26 U/L (ref 15–37)
Albumin: 2.8 g/dL — ABNORMAL LOW (ref 3.4–5.0)
Alkaline Phosphatase: 69 U/L
Anion Gap: 7 (ref 7–16)
BUN: 14 mg/dL (ref 7–18)
Bilirubin,Total: 0.3 mg/dL (ref 0.2–1.0)
CALCIUM: 8.7 mg/dL (ref 8.5–10.1)
CHLORIDE: 103 mmol/L (ref 98–107)
CREATININE: 0.94 mg/dL (ref 0.60–1.30)
Co2: 30 mmol/L (ref 21–32)
GFR CALC NON AF AMER: 58 — AB
GLUCOSE: 102 mg/dL — AB (ref 65–99)
OSMOLALITY: 280 (ref 275–301)
Potassium: 3.1 mmol/L — ABNORMAL LOW (ref 3.5–5.1)
SGPT (ALT): 18 U/L (ref 12–78)
Sodium: 140 mmol/L (ref 136–145)
TOTAL PROTEIN: 6.9 g/dL (ref 6.4–8.2)

## 2013-12-02 LAB — MAGNESIUM: Magnesium: 1.8 mg/dL

## 2013-12-02 LAB — CLOSTRIDIUM DIFFICILE(ARMC)

## 2013-12-02 LAB — TROPONIN I: Troponin-I: 0.03 ng/mL

## 2013-12-03 DIAGNOSIS — I10 Essential (primary) hypertension: Secondary | ICD-10-CM | POA: Diagnosis not present

## 2013-12-03 DIAGNOSIS — E876 Hypokalemia: Secondary | ICD-10-CM | POA: Diagnosis not present

## 2013-12-03 DIAGNOSIS — G9340 Encephalopathy, unspecified: Secondary | ICD-10-CM | POA: Diagnosis not present

## 2013-12-03 DIAGNOSIS — R443 Hallucinations, unspecified: Secondary | ICD-10-CM | POA: Diagnosis not present

## 2013-12-03 DIAGNOSIS — R109 Unspecified abdominal pain: Secondary | ICD-10-CM | POA: Diagnosis not present

## 2013-12-03 LAB — WBCS, STOOL

## 2013-12-03 LAB — URINE CULTURE

## 2013-12-04 ENCOUNTER — Ambulatory Visit: Payer: Self-pay | Admitting: Neurology

## 2013-12-04 DIAGNOSIS — Z9889 Other specified postprocedural states: Secondary | ICD-10-CM | POA: Diagnosis not present

## 2013-12-04 DIAGNOSIS — E876 Hypokalemia: Secondary | ICD-10-CM | POA: Diagnosis not present

## 2013-12-04 DIAGNOSIS — R5381 Other malaise: Secondary | ICD-10-CM | POA: Diagnosis not present

## 2013-12-04 DIAGNOSIS — I10 Essential (primary) hypertension: Secondary | ICD-10-CM | POA: Diagnosis not present

## 2013-12-04 DIAGNOSIS — R109 Unspecified abdominal pain: Secondary | ICD-10-CM | POA: Diagnosis not present

## 2013-12-04 DIAGNOSIS — Z96659 Presence of unspecified artificial knee joint: Secondary | ICD-10-CM | POA: Diagnosis not present

## 2013-12-04 DIAGNOSIS — G9340 Encephalopathy, unspecified: Secondary | ICD-10-CM | POA: Diagnosis not present

## 2013-12-04 DIAGNOSIS — R443 Hallucinations, unspecified: Secondary | ICD-10-CM | POA: Diagnosis not present

## 2013-12-04 DIAGNOSIS — Z79899 Other long term (current) drug therapy: Secondary | ICD-10-CM | POA: Diagnosis not present

## 2013-12-04 DIAGNOSIS — R4182 Altered mental status, unspecified: Secondary | ICD-10-CM | POA: Diagnosis not present

## 2013-12-04 DIAGNOSIS — F329 Major depressive disorder, single episode, unspecified: Secondary | ICD-10-CM | POA: Diagnosis not present

## 2013-12-04 LAB — FOLATE: Folic Acid: 11.5 ng/mL (ref 3.1–100.0)

## 2013-12-04 LAB — STOOL CULTURE

## 2013-12-05 DIAGNOSIS — F329 Major depressive disorder, single episode, unspecified: Secondary | ICD-10-CM | POA: Diagnosis not present

## 2013-12-05 DIAGNOSIS — S60929A Unspecified superficial injury of unspecified hand, initial encounter: Secondary | ICD-10-CM | POA: Diagnosis not present

## 2013-12-05 DIAGNOSIS — D649 Anemia, unspecified: Secondary | ICD-10-CM | POA: Diagnosis not present

## 2013-12-05 DIAGNOSIS — M159 Polyosteoarthritis, unspecified: Secondary | ICD-10-CM | POA: Diagnosis not present

## 2013-12-05 DIAGNOSIS — I10 Essential (primary) hypertension: Secondary | ICD-10-CM | POA: Diagnosis not present

## 2013-12-05 DIAGNOSIS — F3289 Other specified depressive episodes: Secondary | ICD-10-CM | POA: Diagnosis not present

## 2013-12-05 DIAGNOSIS — Z433 Encounter for attention to colostomy: Secondary | ICD-10-CM | POA: Diagnosis not present

## 2013-12-06 LAB — CULTURE, BLOOD (SINGLE)

## 2013-12-08 DIAGNOSIS — S60929A Unspecified superficial injury of unspecified hand, initial encounter: Secondary | ICD-10-CM | POA: Diagnosis not present

## 2013-12-08 DIAGNOSIS — I10 Essential (primary) hypertension: Secondary | ICD-10-CM | POA: Diagnosis not present

## 2013-12-08 DIAGNOSIS — F3289 Other specified depressive episodes: Secondary | ICD-10-CM | POA: Diagnosis not present

## 2013-12-08 DIAGNOSIS — Z433 Encounter for attention to colostomy: Secondary | ICD-10-CM | POA: Diagnosis not present

## 2013-12-08 DIAGNOSIS — D649 Anemia, unspecified: Secondary | ICD-10-CM | POA: Diagnosis not present

## 2013-12-08 DIAGNOSIS — M159 Polyosteoarthritis, unspecified: Secondary | ICD-10-CM | POA: Diagnosis not present

## 2013-12-08 DIAGNOSIS — F329 Major depressive disorder, single episode, unspecified: Secondary | ICD-10-CM | POA: Diagnosis not present

## 2013-12-09 DIAGNOSIS — M159 Polyosteoarthritis, unspecified: Secondary | ICD-10-CM | POA: Diagnosis not present

## 2013-12-09 DIAGNOSIS — S60929A Unspecified superficial injury of unspecified hand, initial encounter: Secondary | ICD-10-CM | POA: Diagnosis not present

## 2013-12-09 DIAGNOSIS — F329 Major depressive disorder, single episode, unspecified: Secondary | ICD-10-CM | POA: Diagnosis not present

## 2013-12-09 DIAGNOSIS — F3289 Other specified depressive episodes: Secondary | ICD-10-CM | POA: Diagnosis not present

## 2013-12-09 DIAGNOSIS — I10 Essential (primary) hypertension: Secondary | ICD-10-CM | POA: Diagnosis not present

## 2013-12-09 DIAGNOSIS — D649 Anemia, unspecified: Secondary | ICD-10-CM | POA: Diagnosis not present

## 2013-12-09 DIAGNOSIS — Z433 Encounter for attention to colostomy: Secondary | ICD-10-CM | POA: Diagnosis not present

## 2013-12-10 DIAGNOSIS — Z433 Encounter for attention to colostomy: Secondary | ICD-10-CM | POA: Diagnosis not present

## 2013-12-10 DIAGNOSIS — I10 Essential (primary) hypertension: Secondary | ICD-10-CM | POA: Diagnosis not present

## 2013-12-10 DIAGNOSIS — S60929A Unspecified superficial injury of unspecified hand, initial encounter: Secondary | ICD-10-CM | POA: Diagnosis not present

## 2013-12-10 DIAGNOSIS — M159 Polyosteoarthritis, unspecified: Secondary | ICD-10-CM | POA: Diagnosis not present

## 2013-12-10 DIAGNOSIS — D649 Anemia, unspecified: Secondary | ICD-10-CM | POA: Diagnosis not present

## 2013-12-10 DIAGNOSIS — F329 Major depressive disorder, single episode, unspecified: Secondary | ICD-10-CM | POA: Diagnosis not present

## 2013-12-10 DIAGNOSIS — F3289 Other specified depressive episodes: Secondary | ICD-10-CM | POA: Diagnosis not present

## 2013-12-15 DIAGNOSIS — F329 Major depressive disorder, single episode, unspecified: Secondary | ICD-10-CM | POA: Diagnosis not present

## 2013-12-15 DIAGNOSIS — S60929A Unspecified superficial injury of unspecified hand, initial encounter: Secondary | ICD-10-CM | POA: Diagnosis not present

## 2013-12-15 DIAGNOSIS — M159 Polyosteoarthritis, unspecified: Secondary | ICD-10-CM | POA: Diagnosis not present

## 2013-12-15 DIAGNOSIS — F3289 Other specified depressive episodes: Secondary | ICD-10-CM | POA: Diagnosis not present

## 2013-12-15 DIAGNOSIS — Z433 Encounter for attention to colostomy: Secondary | ICD-10-CM | POA: Diagnosis not present

## 2013-12-15 DIAGNOSIS — I10 Essential (primary) hypertension: Secondary | ICD-10-CM | POA: Diagnosis not present

## 2013-12-15 DIAGNOSIS — D649 Anemia, unspecified: Secondary | ICD-10-CM | POA: Diagnosis not present

## 2013-12-17 DIAGNOSIS — Z433 Encounter for attention to colostomy: Secondary | ICD-10-CM | POA: Diagnosis not present

## 2013-12-17 DIAGNOSIS — F329 Major depressive disorder, single episode, unspecified: Secondary | ICD-10-CM | POA: Diagnosis not present

## 2013-12-17 DIAGNOSIS — M159 Polyosteoarthritis, unspecified: Secondary | ICD-10-CM | POA: Diagnosis not present

## 2013-12-17 DIAGNOSIS — D649 Anemia, unspecified: Secondary | ICD-10-CM | POA: Diagnosis not present

## 2013-12-17 DIAGNOSIS — F3289 Other specified depressive episodes: Secondary | ICD-10-CM | POA: Diagnosis not present

## 2013-12-17 DIAGNOSIS — I10 Essential (primary) hypertension: Secondary | ICD-10-CM | POA: Diagnosis not present

## 2013-12-17 DIAGNOSIS — S60929A Unspecified superficial injury of unspecified hand, initial encounter: Secondary | ICD-10-CM | POA: Diagnosis not present

## 2013-12-18 DIAGNOSIS — M159 Polyosteoarthritis, unspecified: Secondary | ICD-10-CM | POA: Diagnosis not present

## 2013-12-18 DIAGNOSIS — I10 Essential (primary) hypertension: Secondary | ICD-10-CM | POA: Diagnosis not present

## 2013-12-18 DIAGNOSIS — Z433 Encounter for attention to colostomy: Secondary | ICD-10-CM | POA: Diagnosis not present

## 2013-12-18 DIAGNOSIS — S60929A Unspecified superficial injury of unspecified hand, initial encounter: Secondary | ICD-10-CM | POA: Diagnosis not present

## 2013-12-18 DIAGNOSIS — F329 Major depressive disorder, single episode, unspecified: Secondary | ICD-10-CM | POA: Diagnosis not present

## 2013-12-18 DIAGNOSIS — D649 Anemia, unspecified: Secondary | ICD-10-CM | POA: Diagnosis not present

## 2013-12-18 DIAGNOSIS — F3289 Other specified depressive episodes: Secondary | ICD-10-CM | POA: Diagnosis not present

## 2013-12-23 DIAGNOSIS — I10 Essential (primary) hypertension: Secondary | ICD-10-CM | POA: Diagnosis not present

## 2013-12-23 DIAGNOSIS — F329 Major depressive disorder, single episode, unspecified: Secondary | ICD-10-CM | POA: Diagnosis not present

## 2013-12-23 DIAGNOSIS — S60929A Unspecified superficial injury of unspecified hand, initial encounter: Secondary | ICD-10-CM | POA: Diagnosis not present

## 2013-12-23 DIAGNOSIS — F3289 Other specified depressive episodes: Secondary | ICD-10-CM | POA: Diagnosis not present

## 2013-12-23 DIAGNOSIS — M159 Polyosteoarthritis, unspecified: Secondary | ICD-10-CM | POA: Diagnosis not present

## 2013-12-23 DIAGNOSIS — D649 Anemia, unspecified: Secondary | ICD-10-CM | POA: Diagnosis not present

## 2013-12-23 DIAGNOSIS — Z433 Encounter for attention to colostomy: Secondary | ICD-10-CM | POA: Diagnosis not present

## 2013-12-25 DIAGNOSIS — M159 Polyosteoarthritis, unspecified: Secondary | ICD-10-CM | POA: Diagnosis not present

## 2013-12-25 DIAGNOSIS — F329 Major depressive disorder, single episode, unspecified: Secondary | ICD-10-CM | POA: Diagnosis not present

## 2013-12-25 DIAGNOSIS — I10 Essential (primary) hypertension: Secondary | ICD-10-CM | POA: Diagnosis not present

## 2013-12-25 DIAGNOSIS — D649 Anemia, unspecified: Secondary | ICD-10-CM | POA: Diagnosis not present

## 2013-12-25 DIAGNOSIS — S60929A Unspecified superficial injury of unspecified hand, initial encounter: Secondary | ICD-10-CM | POA: Diagnosis not present

## 2013-12-25 DIAGNOSIS — Z433 Encounter for attention to colostomy: Secondary | ICD-10-CM | POA: Diagnosis not present

## 2013-12-25 DIAGNOSIS — F3289 Other specified depressive episodes: Secondary | ICD-10-CM | POA: Diagnosis not present

## 2013-12-28 DIAGNOSIS — D649 Anemia, unspecified: Secondary | ICD-10-CM | POA: Diagnosis not present

## 2013-12-28 DIAGNOSIS — F3289 Other specified depressive episodes: Secondary | ICD-10-CM | POA: Diagnosis not present

## 2013-12-28 DIAGNOSIS — Z433 Encounter for attention to colostomy: Secondary | ICD-10-CM | POA: Diagnosis not present

## 2013-12-28 DIAGNOSIS — F329 Major depressive disorder, single episode, unspecified: Secondary | ICD-10-CM | POA: Diagnosis not present

## 2013-12-28 DIAGNOSIS — M159 Polyosteoarthritis, unspecified: Secondary | ICD-10-CM | POA: Diagnosis not present

## 2013-12-28 DIAGNOSIS — I10 Essential (primary) hypertension: Secondary | ICD-10-CM | POA: Diagnosis not present

## 2013-12-28 DIAGNOSIS — S60929A Unspecified superficial injury of unspecified hand, initial encounter: Secondary | ICD-10-CM | POA: Diagnosis not present

## 2013-12-31 DIAGNOSIS — F3289 Other specified depressive episodes: Secondary | ICD-10-CM | POA: Diagnosis not present

## 2013-12-31 DIAGNOSIS — F329 Major depressive disorder, single episode, unspecified: Secondary | ICD-10-CM | POA: Diagnosis not present

## 2013-12-31 DIAGNOSIS — Z433 Encounter for attention to colostomy: Secondary | ICD-10-CM | POA: Diagnosis not present

## 2013-12-31 DIAGNOSIS — D649 Anemia, unspecified: Secondary | ICD-10-CM | POA: Diagnosis not present

## 2013-12-31 DIAGNOSIS — I10 Essential (primary) hypertension: Secondary | ICD-10-CM | POA: Diagnosis not present

## 2013-12-31 DIAGNOSIS — M159 Polyosteoarthritis, unspecified: Secondary | ICD-10-CM | POA: Diagnosis not present

## 2013-12-31 DIAGNOSIS — S60929A Unspecified superficial injury of unspecified hand, initial encounter: Secondary | ICD-10-CM | POA: Diagnosis not present

## 2014-01-01 DIAGNOSIS — I10 Essential (primary) hypertension: Secondary | ICD-10-CM | POA: Diagnosis not present

## 2014-01-01 DIAGNOSIS — M353 Polymyalgia rheumatica: Secondary | ICD-10-CM | POA: Diagnosis not present

## 2014-01-01 DIAGNOSIS — H5789 Other specified disorders of eye and adnexa: Secondary | ICD-10-CM | POA: Diagnosis not present

## 2014-01-01 DIAGNOSIS — N200 Calculus of kidney: Secondary | ICD-10-CM | POA: Diagnosis not present

## 2014-01-06 DIAGNOSIS — S60929A Unspecified superficial injury of unspecified hand, initial encounter: Secondary | ICD-10-CM | POA: Diagnosis not present

## 2014-01-06 DIAGNOSIS — F3289 Other specified depressive episodes: Secondary | ICD-10-CM | POA: Diagnosis not present

## 2014-01-06 DIAGNOSIS — D649 Anemia, unspecified: Secondary | ICD-10-CM | POA: Diagnosis not present

## 2014-01-06 DIAGNOSIS — Z433 Encounter for attention to colostomy: Secondary | ICD-10-CM | POA: Diagnosis not present

## 2014-01-06 DIAGNOSIS — M159 Polyosteoarthritis, unspecified: Secondary | ICD-10-CM | POA: Diagnosis not present

## 2014-01-06 DIAGNOSIS — F329 Major depressive disorder, single episode, unspecified: Secondary | ICD-10-CM | POA: Diagnosis not present

## 2014-01-06 DIAGNOSIS — I10 Essential (primary) hypertension: Secondary | ICD-10-CM | POA: Diagnosis not present

## 2014-01-14 DIAGNOSIS — H0019 Chalazion unspecified eye, unspecified eyelid: Secondary | ICD-10-CM | POA: Diagnosis not present

## 2014-02-11 DIAGNOSIS — H1045 Other chronic allergic conjunctivitis: Secondary | ICD-10-CM | POA: Diagnosis not present

## 2014-05-04 DIAGNOSIS — Z23 Encounter for immunization: Secondary | ICD-10-CM | POA: Diagnosis not present

## 2014-08-06 DIAGNOSIS — H01009 Unspecified blepharitis unspecified eye, unspecified eyelid: Secondary | ICD-10-CM | POA: Diagnosis not present

## 2014-08-10 DIAGNOSIS — H903 Sensorineural hearing loss, bilateral: Secondary | ICD-10-CM | POA: Diagnosis not present

## 2014-08-13 DIAGNOSIS — H01009 Unspecified blepharitis unspecified eye, unspecified eyelid: Secondary | ICD-10-CM | POA: Diagnosis not present

## 2014-08-25 DIAGNOSIS — H2513 Age-related nuclear cataract, bilateral: Secondary | ICD-10-CM | POA: Diagnosis not present

## 2014-11-19 NOTE — Consult Note (Signed)
Brief Consult Note: Diagnosis: Right Total Hip Arthroplasty.   Comments: Notes from recent right Total Hip Arthroplasty were obtained from Dr. Burnis Kingfisher and placed in the chart. An anterior approach was utilized .Patient may be WBAT with anterior hip precautions.  Patient was asleep. I will return tomorrow for further evaluatin and recommendations.  Electronic Signatures: Dereck Leep (MD)  (Signed 11-Nov-14 00:18)  Authored: Brief Consult Note   Last Updated: 11-Nov-14 00:18 by Dereck Leep (MD)

## 2014-11-19 NOTE — H&P (Signed)
PATIENT NAMEELBERTA, Tina [name] MR#:  [id number] DATE OF BIRTH:  1932/12/02  DATE OF ADMISSION:  06/29/2013  CHIEF COMPLAINT: Weakness.  HISTORY OF PRESENT ILLNESS:  This a patient who was discharged from the hospital 3 days ago, Friday, following approximately a 3-week hospital stay for perforated diverticulitis where she had a Hartmann's procedure performed. She had a prolonged postoperative ileus, requiring TPN, and was sent to a rehab facility on Friday. She states that over the weekend she has had no energy, no appetite, vomited food 2 times on Saturday, none yesterday and then this morning vomited bile once. She has minimal to no abdominal pain, stating that she has had good ostomy output, mostly liquid in nature although there is some discrepancy as to whether or not she has had any ostomy output today. She has had no fevers or chills and has had a hard time participating in the physical therapy at the rehab facility.   PAST MEDICAL HISTORY: Arthritis, perforated diverticulitis.   PAST SURGICAL HISTORY: Recent Hartmann's procedure with end-colostomy, a hip surgery and a total hysterectomy, knee replacement.   MEDICATIONS ON DISCHARGE: Loratadine, multivitamins, meloxicam, aspirin, trazodone, pantoprazole, simethicone, lorazepam, aluminum and magnesium hydroxide, Percocet and Tylenol.   ALLERGIES: ERYTHROMYCIN.   REVIEW OF SYSTEMS: A 10-system review has been performed and negative with the exception of that mentioned in the HPI.     FAMILY HISTORY: Noncontributory.   SOCIAL HISTORY: The patient does not smoke or drink at this time.   PHYSICAL EXAMINATION: GENERAL: Obese, but wasted, weak-appearing patient in general.  VITAL SIGNS: Temp of 97.8, pulse of 93, respirations 20, blood pressure 171/67, 96% room air sat although she is on supplemental oxygen in the ER, not recorded.  HEENT: Dry mucous membranes, poor skin turgor. No palpable neck nodes. No scleral icterus.  CHEST: Clear  to auscultation.  CARDIAC: Regular rate and rhythm.  ABDOMEN: Shows a long midline scar. There is an open area in the periumbilical area which appears chronic. There is a more active area in the caudad extent of the wound which is draining serous fluid only, no purulence. Skin staples are present and Steri-Strips are present as well. There is no overlying erythema. Fascia appears intact in both areas. There is a left lower quadrant colostomy with stool in the bag. There is no parastomal hernia. The abdomen is slightly distended. No tympany, no tenderness and no fluid wave.  EXTREMITIES: Show moderate edema.  NEUROLOGIC: Grossly intact.  INTEGUMENT: No jaundice. Poor skin turgor.   LABORATORY AND RADIOLOGICAL VALUES: Demonstrate a white blood cell count of 12.5, an H and H of 9.8 and 29.4 with a platelet count of 740. Electrolytes are within normal limits, BUN is 25, creatinine 1.0, potassium of 3.8, calcium of 8.4 with an albumin of 1.7 and a lipase of 97.   CT scan is personally reviewed. There was a question about a fistula to the abdominal wall midline wound (Clinically, there is no evidence for this and likely represents a simple wound dehiscence, possible wound infection without fistula.). Distended small bowel is noted. There is no real significant transition point. In fact, the small bowel seems distended all the way down to the colon and the colon itself is somewhat collapsed. There is no free air.   ASSESSMENT AND PLAN: This is a patient with a recent history of Hartmann's procedure for perforated diverticulitis. She had been in the hospital for 3 weeks and was discharged on Friday, 3 days ago. She returns  today where her chief complaint is not nausea, vomiting, or poor ostomy output as suggested by the Emergency Room physician but that of weakness and poor appetite. She vomited twice on Saturday and once this morning. She is not terribly distended although her CT scan shows distended,  fluid-filled bowel loops. She is having ostomy output and is nontender.   My plan would be to place a nasogastric tube to attempt to decompress some of the small bowel which does appear dilated with fluid but there is no clear sign of small bowel obstruction. This may represent an ileus. She is severely deconditioned and will need TPN again.  She has been off her TPN for approximately 5 days. [name] will ask for a PICC line to be placed tomorrow morning by radiology and ask for a nutrition consult as well as a physical therapy consult so that she does not lose any ground in her attempts at becoming ambulatory again.   She has a low-grade wound infection. She may require the wound to be opened as many staples have pulled apart but the 2 open areas will be packed initially after obtaining cultures. [name] will start her empirically on Zosyn but these findings may suggest more of a low-grade wound infection versus severe protein-calorie malnutrition with impaired wound healing and await the cultures ultimately. [name] will discuss this patient's care with Dr. Pat Patrick, who is coming on in the night shift this evening, for any other further suggestions and order a followup abdominal film for tomorrow morning. This was discussed with the patient's son, who is in agreement, and with Dr. Cinda Quest in the Emergency Room.     ____________________________ Jerrol Banana Burt Knack, MD rec:cs D: 06/29/2013 16:53:02 ET T: 06/29/2013 18:40:52 ET JOB#: [id number]  cc: Jerrol Banana. Burt Knack, MD, <Dictator> Florene Glen MD ELECTRONICALLY SIGNED 07/01/2013 18:07

## 2014-11-19 NOTE — Discharge Summary (Signed)
PATIENT NAME:  Tina [name], Tina [name] MR#:  [id number] DATE OF BIRTH:  03-Oct-1932  DATE OF ADMISSION:  06/12/2013 DATE OF DISCHARGE:  06/26/2013  PRINCIPAL DIAGNOSIS:  Perforated acute diverticulitis.  PRINCIPAL PROCEDURE PERFORMED DURING THIS ADMISSION:  Hartmann's procedure 06/08/2013.   HOSPITAL COURSE: The patient had a prolonged hospital course because of delayed ostomy function but ultimately her colostomy began working with the assistance of Entereg. She continued to take significant amounts of opioid analgesics throughout her hospitalization. She was quite deconditioned but was active with physical therapy and was ambulatory and eating by the time of discharge.  DISCHARGE MEDICATIONS:  At the time of discharge were as follows: 1.  Loratadine 10 mg daily. 2.  Multivitamin 1 daily. 3.  Meloxicam 15 mg p.r.n. pain. 4.  Aspirin 325 mg q. 12h. 5.  Trazodone extended-release 75 mg at bedtime. 6.  Pantoprazole 40 mg delayed-release b.[name].d. 7.  Simethicone 80 mg q. 8h. p.r.n. gas pain. 8.  Lorazepam 1 mg IV q. 2h. p.r.n. (she was using this approximately once per day). 9.  Aluminum hydroxide, magnesium hydroxide, simethicone 400/400/40 per 5 mL, 30 mL q. 6h. 10.  Acetaminophen, oxycodone 325 mg/5 mg 1 to 2 q. 4 to 6h. p.r.n. 11.  Acetaminophen 325 mg 1 to 2 q. 4h. p.r.n. pain or fever.  She was given an appointment to see Dr. Pat Patrick within the next week and asked to call the office in the interim for any problems.   ____________________________ Consuela Mimes, MD wfm:ce D: 06/26/2013 11:54:47 ET T: 06/26/2013 12:17:19 ET JOB#: [id number]  cc: Consuela Mimes, MD, <Dictator> Consuela Mimes MD ELECTRONICALLY SIGNED 06/26/2013 13:27

## 2014-11-19 NOTE — Op Note (Signed)
PATIENT NAME:  Tina [name], Tina [name] MR#:  [id number] DATE OF BIRTH:  1933-01-05  DATE OF PROCEDURE:  06/30/2013  PREOPERATIVE DIAGNOSIS:  Prolonged ileus requiring parenteral nutrition.   POSTOPERATIVE DIAGNOSIS:  Prolonged ileus requiring parenteral nutrition.   PROCEDURES:  1. Ultrasound guidance for vascular access to the right basilic vein.  2. Fluoroscopic guidance for placement of catheter.  3. Insertion of peripherally inserted central venous catheter, single lumen PICC line, right arm.  SURGEON: Hortencia Pilar, MD  ANESTHESIA: Local.   ESTIMATED BLOOD LOSS: Minimal.   INDICATION FOR PROCEDURE: Requiring total parenteral nutrition.   DESCRIPTION OF PROCEDURE: The patient's right arm was sterilely prepped and draped, and a sterile surgical field was created. The basilic vein was accessed under direct ultrasound guidance without difficulty with a micropuncture needle and permanent image was recorded. 0.018 wire was then placed into the superior vena cava. Peel-away sheath was placed over the wire. A single lumen peripherally inserted central venous catheter was then placed over the wire and the wire and peel-away sheath were removed. The catheter tip was placed into the superior vena cava and was secured at the skin at 34 cm with a sterile dressing. The catheter withdrew blood well and flushed easily with heparinized saline. The patient tolerated procedure well.   ____________________________ Katha Cabal, MD ggs:cc D: 06/30/2013 19:30:00 ET T: 06/30/2013 21:15:41 ET JOB#: [id number]  cc: Katha Cabal, MD, <Dictator> Katha Cabal MD ELECTRONICALLY SIGNED 07/06/2013 17:25

## 2014-11-19 NOTE — Discharge Summary (Signed)
PATIENT NAME:  Tina [name], Tina [name] MR#:  [id number] DATE OF BIRTH:  1933-06-11  DATE OF ADMISSION:  06/29/2013 DATE OF DISCHARGE:  07/08/2013  Transferred to long-term acute care facility.  PRIMARY CARE PHYSICIAN: Dr. Margarita Rana   ADMITTING PHYSICIAN: Dr. Pat Patrick.   BRIEF HISTORY: Tina [name] is a 81 year old woman admitted several weeks ago with an acute perforated diverticulitis. She was taken urgently to surgery as she had significant amounts of free air and abscess formation. The surgery was uncomplicated and a Hartman's procedure was performed without difficulty. She had an end colostomy created. She had very slow return of bowel function. She had recently undergone a right hip replacement 10 days prior to the onset of her diverticulitis symptoms and so with 2 surgeries in close order, she had had a very slow recovery. She was transferred to [facility] for rehabilitation when she had return of bowel function. She became acutely nauseated over the next 48 hours and was transferred back to the hospital with poor ostomy function and signs and symptoms consistent with an ileus. CT scan did not demonstrate any evidence of obstruction. She was placed back on TPN with a PICC line and slowly begun on p.o. liquids. Her ileus resolved over the next several days. CT contrast passed through without difficulty. There was evidence of continued bowel function. She has some mild wound problems with some inflammatory change at the base of her wounds, which were treated with IV antibiotics. Her symptoms resolved. She is off the IV antibiotics at the present time. She did develop an episode of shortness of breath and was worked up for possible pulmonary embolus. There was no evidence for any pulmonary compromise. She had difficulty with anxiety while hospitalized and has been treated with multiple medications for that problem. Because of her slow return to normal activity and the goal of eventually  returning to independent living, she is being transferred to a long-term care facility in order to continue to get wound care, ostomy care and total parenteral nutrition. Her albumin has remained low and this elevation would be our goal with continued supplemental nutrition. This plan has been discussed with the patient and her son and they are in agreement.   At the time of discharge her medications include loratadine 10 mg once a day, Protonix 40 mg b.[name].d., aspirin 325 mg once a day, Meloxicam 7.5 mg 2 tablets once a day, oxycodone/ acetaminophen 5/325 mg every 6 hours p.r.n., Tylenol 325 q.6 hours for temperature greater than 100.5, heparin 5000 units subcutaneous q.8 hours while hospitalized, Zofran 4 mg every 6 hours IV p.r.n. while hospitalized, continue intravenous fat emulsions 500 mL every other day and albuterol inhaler 2.5 mg b.[name].d. p.r.n.   FINAL DISCHARGE DIAGNOSIS: Postoperative ileus.  ____________________________ Rodena Goldmann III, MD rle:aw D: 07/08/2013 12:15:08 ET T: 07/08/2013 12:52:20 ET JOB#: [id number]  cc: Micheline Maze, MD, <Dictator> Jerrell Belfast, MD Rodena Goldmann MD ELECTRONICALLY SIGNED 07/10/2013 23:38

## 2014-11-19 NOTE — Op Note (Signed)
PATIENT NAME:  Tina [name], Tina [name] MR#:  [id number] DATE OF BIRTH:  Oct 25, 1932  DATE OF PROCEDURE:  06/08/2013  PREOPERATIVE DIAGNOSIS: Perforated small intestine and small bowel obstruction.   POSTOPERATIVE DIAGNOSIS: Perforated diverticulitis.   SURGERY: Exploratory laparotomy, Hartman's procedure and central line placement.   ANESTHESIA: General.   SURGEON: Rodena Goldmann, MD   OPERATIVE PROCEDURE: With the patient in the supine position after induction of appropriate general anesthesia and placed on a Foley catheter, the patient's abdomen was prepped with Betadine and draped with sterile towels. An alcohol wipe and Betadine impregnated Steri-Drape were utilized. A midline incision was made just above the umbilicus to just above the suprapubic area and carried down through the subcutaneous tissue with Bovie electrocautery. Midline fascia was identified and opened the length of the skin incision as was the peritoneum. There was a large amount of feculent material notified immediately. The omentum was trapped in the pelvis securing a large mass of small bowel, which had multiple abscesses. Because of the CT findings, the small bowel was run from the ligament of Treitz to the ileocecal valve and no abnormality was identified. The bowel was run in the opposite direction and again, nothing but fibrinous exudate, but no leak or kink was identified. The bowel was quite thickened in the pelvis from whatever inflammatory process was involved. In looking at the sigmoid colon, there appear to be a perforation on the anterior surface of the sigmoid colon. There was significant inflammatory reaction in the pelvis. The left colon and sigmoid colon were mobilized using the harmonic scalpel. The dissection was taken around the splenic flexure to mobilize the bowel to the mid transverse colon. The distal sigmoid and proximal rectum was divided with a single application of the Contour stapling device carrying a green  load. The mesentery was then dissected back with the Harmonic scalpel just along the colon border. The IMA was identified and oversewn with 3-0 Vicryl. The bowel was taken in the mid descending colon again with the Contour stapler carrying a green load.  The bowel was passed off the table and placed in formalin. The pelvis was copiously irrigated with warm saline solution. A site was chosen for the ostomy on the left upper abdominal wall. A circular incision was made without difficulty. The cruciate fascial incision was made. The bowel was brought through the abdominal wall without difficulty. The fascia and peritoneum were sutured to seromuscular sutures of the colon using 3-0 silk. The abdomen was then again irrigated and bowel contents were returned to their anatomic position. The midline fascia was closed with running looped PDS #1 suture from each end tying in the middle, burying the knot. The skin was clipped over a Penrose drain. The colostomy was then matured using 3-0 and 4-0 Vicryl. A colostomy bag was applied. Sterile dressings applied.   The patient's position was then moved and she was placed in a little bit of Trendelenburg. Her neck was prepped and draped in sterile fashion. The right internal jugular vein was identified by interrogating the neck with the ultrasound and cannulated on a single pass. A wire was passed into the great vessel system without difficulty. The skin incision was enlarged and needle was withdrawn and a dilator placed over the wire and then the dilator removed. A triple lumen catheter was inserted over the wire into the great vessel system. It was secured with 3-0 silk, appropriately flushed and capped. Sterile dressing was applied. The patient was returned to the recovery room,  having tolerated the procedure well. Sponge, instrument and needle counts were correct x 2 in the operating room.   ____________________________ Rodena Goldmann III, MD rle:aw D: 06/08/2013 02:54:26  ET T: 06/08/2013 07:56:50 ET JOB#: [id number]  cc: Micheline Maze, MD, <Dictator> Jerrell Belfast, MD Rodena Goldmann MD ELECTRONICALLY SIGNED 06/18/2013 19:04

## 2014-11-19 NOTE — Discharge Summary (Signed)
PATIENT NAME:  Tina [name], Tina [name] MR#:  [id number] DATE OF BIRTH:  31-May-1933  DATE OF ADMISSION:  06/08/2013 DATE OF DISCHARGE:  06/26/2013  PRINCIPAL DIAGNOSIS:  Perforated acute diverticulitis.  PRINCIPAL PROCEDURE PERFORMED DURING THIS ADMISSION:  Hartmann's procedure 06/08/2013.   HOSPITAL COURSE: The patient had a prolonged hospital course because of delayed ostomy function but ultimately her colostomy began working with the assistance of Entereg. She continued to take significant amounts of opioid analgesics throughout her hospitalization. She was quite deconditioned but was active with physical therapy and was ambulatory and eating by the time of discharge.  DISCHARGE MEDICATIONS:  At the time of discharge were as follows: 1.  Loratadine 10 mg daily. 2.  Multivitamin 1 daily. 3.  Meloxicam 15 mg p.r.n. pain. 4.  Aspirin 325 mg q. 12h. 5.  Trazodone extended-release 75 mg at bedtime. 6.  Pantoprazole 40 mg delayed-release b.[name].d. 7.  Simethicone 80 mg q. 8h. p.r.n. gas pain. 8.  Lorazepam 1 mg IV q. 2h. p.r.n. (she was using this approximately once per day). 9.  Aluminum hydroxide, magnesium hydroxide, simethicone 400/400/40 per 5 mL, 30 mL q. 6h. 10.  Acetaminophen, oxycodone 325 mg/5 mg 1 to 2 q. 4 to 6h. p.r.n. 11.  Acetaminophen 325 mg 1 to 2 q. 4h. p.r.n. pain or fever.  She was given an appointment to see Dr. Pat Patrick within the next week and asked to call the office in the interim for any problems.   ____________________________ Consuela Mimes, MD wfm:ce D: 06/26/2013 11:54:47 ET T: 06/26/2013 12:17:19 ET JOB#: [id number]  cc: Consuela Mimes, MD, <Dictator> Consuela Mimes MD ELECTRONICALLY SIGNED 06/26/2013 13:27

## 2014-11-20 NOTE — Consult Note (Signed)
PATIENT NAME:  Tina [name], HEADEN I MR#:  [id number] DATE OF BIRTH:  11/22/1932  DATE OF CONSULTATION:  12/01/2013  REQUESTING PHYSICIAN: Dr. Leslye Peer    CONSULTING PHYSICIAN:  Otelia Limes. Yves Dill, MD  REASON FOR CONSULTATION: Kidney stone and UPJ obstruction.    HISTORY OF PRESENT ILLNESS: Mrs. Larsen is a 81 year old female who fell while using her walker at home, and apparently lost consciousness and had a head injury. She denies abdominal or flank pain. She denies fever or chills. During her evaluation, she had an abdominopelvic CT scan done earlier today, which indicated chronic right UPJ obstruction and a 10 mm left renal calculus. The patient is a poor historian, but denied flank pain, hematuria, or urinary tract infections. She does not recall prior urological evaluation.   ALLERGIES: ERYTHROMYCIN.   CHRONIC HOME MEDICATIONS: Include lisinopril, metoprolol, oxycodone, Protonix, prednisone, promethazine, and Zoloft.   PREVIOUS SURGICAL PROCEDURES:  Included right hip replacement 2014 and perforated colon surgery, with colostomy January of this year. She also had left knee replacement and hysterectomy.   SOCIAL HISTORY: The patient denied tobacco or alcohol use.   FAMILY HISTORY: Negative for kidney disease and kidney stones.   PAST AND CURRENT MEDICAL CONDITIONS:  1.  Hypertension.  2.  Perforated colon secondary to diverticulitis, with diverting colostomy.  3.  Rheumatoid arthritis.  4.  Hypertension.  5.  Degenerative joint disease.  REVIEW OF SYSTEMS:  The patient denied history of urinary tract infections, pyelonephritis, kidney stone disease, flank pain, or hematuria.   PHYSICAL EXAMINATION: No CVA tenderness.  DATA:  CT scan report dated May 5 was reviewed.   Pertinent laboratory studies include a white cell count of 12,000 and hematocrit of 36.6% May 5. BUN was 12 and creatinine was 0.89 on May 5.   IMPRESSION:  1.  Recent head injury versus stroke.  2.  Chronic right  ureteropelvic junction obstruction.  3.  A 10 mm left renal calculus, asymptomatic.   PLAN:   1.  Intervention is not indicated at the present time.  2.  Follow up with urologist after discharge, and consider elective lithotripsy when medically stable. Since the stone is not and has not been symptomatic, intervention may not even be indicated in the future, unless the patient wishes.     ____________________________ Otelia Limes. Yves Dill, MD mrw:mr D: 12/01/2013 18:06:50 ET T: 12/01/2013 19:07:49 ET JOB#: [id number]  cc: Otelia Limes. Yves Dill, MD, <Dictator> Royston Cowper MD ELECTRONICALLY SIGNED 12/02/2013 8:19

## 2014-11-20 NOTE — Consult Note (Signed)
Referring Physician:  Remer Macho :   Reason for Consult: Admit Date: 01-Dec-2013  Chief Complaint: "I feel pretty good now"  Reason for Consult: altered mental status   History of Present Illness: History of Present Illness:   Tina [name] is a 81 yo woman with histroy of HTN, RA, recent R hip replacement, and recent colostomy for perforated diverticulum who presented to [facility] on 12/01/13 for evaluation of altered mental status, vomiting, and abdominal pain. She has since had a waxing and waning course of confusion and disorientation. Her initial workup included CT of the brain which showed no acute intracranial pathology. She also underwent CT of the abdomen and pelvis showing bilateral renal calculi and worsening right hydronephrosis. Urology was consulted and felt no acute intervention was needed. patient gives a fair history, though is cloudy on the details of her admission. She denies any current abdominal pain, though does report remembering feeling confused yesterday, and "felt like everything was fake". She still complains of poor appetite but says that has been an issue since her colostomy earlier this year.  ROS:  Review of Systems   As per HPI. In addition, the patient denies fevers, chills, visual changes, chest discomfort, palpitations, cough, shortness of breath, diarrhea, new joint tenderness, or rashes. The remainder of a full 12-point review of systems is negative.   Past Medical/Surgical Hx:  depression:   htn:   severe protein calorie malnutrition:   ec fistula:   fungemia:   acute resp failure:   HOH, Hard of Hearing--wear hearing aids:   Arthritis:   hip surgery:   Hysterectomy - Total:   left knee replacement:   Home Medications: Medication Instructions Last Modified Date/Time  oxyCODONE 5 mg oral capsule 1 cap(s) orally every 6 hours, As Needed - for Pain 05-May-15 08:51  lisinopril 10 mg oral tablet 1 tab(s) orally once a day 05-May-15 08:51  promethazine 25  mg oral tablet 1 tab(s) orally every 6 hours, As Needed - for Nausea, Vomiting 05-May-15 08:51  pantoprazole 40 mg oral delayed release tablet 1 tab(s) orally once a day, As Needed 05-May-15 08:51  predniSONE 5 mg oral tablet 1 tab(s) orally once a day 05-May-15 08:51  Zoloft 100 mg oral tablet 1 tab(s) orally once a day 05-May-15 08:51  Metoprolol Tartrate 50 mg oral tablet 1 tab(s) orally 3 times a day 05-May-15 08:51   Allergies:  Erythromycin: N/V/Diarrhea  Social/Family History: Social History: Lives alone; was recently released 2 weeks ago from rehab after colostomy and recent hip surgery. Has only recently been able to ambulate unassisted. Denies smoking, EtOH or illicit drug use.  Family History: Mother had breast cancer and MI. No known FH of seizures or stroke.   Vital Signs:  **Vital Signs.:   08-May-15 02:26  Vital Signs Type Post Medication  Pulse Pulse 59  Systolic BP Systolic BP 242  Diastolic BP (mmHg) Diastolic BP (mmHg) 74  Mean BP 103    08:37  Vital Signs Type Q 4hr  Temperature Temperature (F) 97.1  Celsius 36.1  Temperature Source oral  Pulse Pulse 65  Respirations Respirations 18  Systolic BP Systolic BP 353  Diastolic BP (mmHg) Diastolic BP (mmHg) 76  Mean BP 111  Pulse Ox % Pulse Ox % 96   EXAM: Well-developed, well-nourished, in NAD. No conjunctival injection or scleral edema. Oropharynx clear. No carotid bruits auscultated. Normal S1, S2 and regular cardiac rhythm on exam. Lungs clear to auscultation bilaterally. Abdomen soft and nontender. Peripheral pulses  palpated. Trace pedal edema bilaterally.  MENTAL STATUS: Alert and oriented to person, place, and time. Language fluent and appropriate. Cognition and memory conversationally intact. CRANIAL NERVES: Visual fields full to confrontation. PERRL. EOMI. Facial sensation intact. Facial muscles full and symmetric. Hearing markedly reduced to finger rub bilaterally. Uvula midline with symmetric palatal  elevation. Tongue midline without fasciculations. MOTOR: Normal bulk and tone. Strength 5/5 in deltoids, biceps, triceps, wrist flexors and extensors, and hand intrinsics bilaterally. Strength 5/5 in iliopsoas, glutes, hamstrings, quads, and tib ant bilaterally. REFLEXES: 1+ in biceps, triceps, patella, and achilles bilaterally. Flexor plantar responses bilaterally. SENSORY: Intact to vibration and pinprick throughout. COORDINATION: No ataxia or dysmetria on finger-nose maneuvers. She is unable to perform heel-shin. GAIT: Antalgic and unsteady, though narrow-based with normal arm swing and turning. She is unable to attempt tandem position or tandem gait, reportedly due to R hip pain..  Lab Results:  Thyroid:  06-May-15 00:28   Thyroid Stimulating Hormone 3.41 (0.45-4.50 (International Unit)  ----------------------- Pregnant patients have  different reference  ranges for TSH:  - - - - - - - - - -  Pregnant, first trimetser:  0.36 - 2.50 uIU/mL)  Hepatic:  06-May-15 00:28   Bilirubin, Total 0.3  Alkaline Phosphatase 69 (45-117 NOTE: New Reference Range 06/19/13)  SGPT (ALT) 18  SGOT (AST) 26  Total Protein, Serum 6.9  Albumin, Serum  2.8  Routine Micro:  06-May-15 11:43   Specimen Source CLEAN CATCH    15:21   Micro Text Report CLOSTRIDIUM DIFFICILE   C.DIFFICILE ANTIGEN       C.DIFFICILE GDH ANTIGEN : NEGATIVE   C.DIFFICILE TOXIN A/B     C.DIFFICILE TOXINS A AND B : NEGATIVE   INTERPRETATION            Negative for C. difficile.    ANTIBIOTIC                        Culture Comment NO SALMONELLA OR SHIGELLA ISOLATED  Culture Comment . NO PATHOGENIC E.COLI DETECTED  Culture Comment    . NO CAMPYLOBACTER ANTIGEN DETECTED  Result(s) reported on 03 Dec 2013 at 01:58PM.  Routine Chem:  06-May-15 00:28   Glucose, Serum  102  BUN 14  Creatinine (comp) 0.94  Sodium, Serum 140  Potassium, Serum  3.1  Chloride, Serum 103  CO2, Serum 30  Calcium (Total), Serum 8.7  Osmolality  (calc) 280  eGFR (African American) >60  eGFR (Non-African American)  58 (eGFR values <33m/min/1.73 m2 may be an indication of chronic kidney disease (CKD). Calculated eGFR is useful in patients with stable renal function. The eGFR calculation will not be reliable in acutely ill patients when serum creatinine is changing rapidly. It is not useful in  patients on dialysis. The eGFR calculation may not be applicable to patients at the low and high extremes of body sizes, pregnant women, and vegetarians.)  Anion Gap 7  Magnesium, Serum 1.8 (1.8-2.4 THERAPEUTIC RANGE: 4-7 mg/dL TOXIC: > 10 mg/dL  -----------------------)  Cardiac:  06-May-15 00:28   Troponin I 0.03 (0.00-0.05 0.05 ng/mL or less: NEGATIVE  Repeat testing in 3-6 hrs  if clinically indicated. >0.05 ng/mL: POTENTIAL  MYOCARDIAL INJURY. Repeat  testing in 3-6 hrs if  clinically indicated. NOTE: An increase or decrease  of 30% or more on serial  testing suggests a  clinically important change)  Routine UA:  05-May-15 07:45   Color (UA) Straw  Clarity (UA) Clear  Glucose (UA)  Negative  Bilirubin (UA) Negative  Ketones (UA) Negative  Specific Gravity (UA) 1.011  Blood (UA) 1+  pH (UA) 7.0  Protein (UA) 100 mg/dL  Nitrite (UA) Negative  Leukocyte Esterase (UA) Negative (Result(s) reported on 01 Dec 2013 at 08:09AM.)  RBC (UA) 1 /HPF  WBC (UA) 1 /HPF  Bacteria (UA) NONE SEEN  Epithelial Cells (UA) <1 /HPF  Mucous (UA) PRESENT  Hyaline Cast (UA) 1 /LPF (Result(s) reported on 01 Dec 2013 at 08:09AM.)  Routine Coag:  05-May-15 05:13   Prothrombin 13.2  INR 1.0 (INR reference interval applies to patients on anticoagulant therapy. A single INR therapeutic range for coumarins is not optimal for all indications; however, the suggested range for most indications is 2.0 - 3.0. Exceptions to the INR Reference Range may include: Prosthetic heart valves, acute myocardial infarction, prevention of  myocardial infarction, and combinations of aspirin and anticoagulant. The need for a higher or lower target INR must be assessed individually. Reference: The Pharmacology and Management of the Vitamin K  antagonists: the seventh ACCP Conference on Antithrombotic and Thrombolytic Therapy. VOZDG.6440 Sept:126 (3suppl): [id number]. A HCT value >55% may artifactually increase the PT.  In one study,  the increase was an average of 25%. Reference:  "Effect on Routine and Special Coagulation Testing Values of Citrate Anticoagulant Adjustment in Patients with High HCT Values." American Journal of Clinical Pathology 2006;126:400-405.)  Routine Hem:  06-May-15 00:28   WBC (CBC) 8.5  RBC (CBC)  3.77  Hemoglobin (CBC)  11.1  Hematocrit (CBC)  33.7  Platelet Count (CBC) 288  MCV 89  MCH 29.4  MCHC 32.9  RDW  17.7  Neutrophil % 72.5  Lymphocyte % 17.6  Monocyte % 8.2  Eosinophil % 1.1  Basophil % 0.6  Neutrophil # 6.2  Lymphocyte # 1.5  Monocyte # 0.7  Eosinophil # 0.1  Basophil # 0.1 (Result(s) reported on 02 Dec 2013 at 12:53AM.)   Radiology Results: CT:    05-May-15 04:58, CT Head Without Contrast  CT Head Without Contrast   REASON FOR EXAM:    altered mental status  COMMENTS:       PROCEDURE: CT  - CT HEAD WITHOUT CONTRAST  - Dec 01 2013  4:58AM     CLINICAL DATA:  Altered mental status    EXAM:  CT HEAD WITHOUT CONTRAST    TECHNIQUE:  Contiguous axial images were obtained from the base of the skull  through the vertex without intravenous contrast.    COMPARISON:  08/18/2013  FINDINGS:  Skull and Sinuses:No significant abnormality.    Orbits: No acute abnormality.    Brain: No evidence of acute abnormality, such as acute infarction,  hemorrhage, hydrocephalus, or mass lesion/mass effect. Generalized  cerebral volume loss. There is a stable pattern of patchy bilateral  deep cerebral white matter low-attenuation, consistent with ischemic  gliosis.      IMPRESSION:  1. No acute intra-abdominal findings.  2. Brain atrophy and chronic small vessel disease.  Electronically Signed    By: Jorje Guild M.D.    On: 12/01/2013 04:59         Verified By: Gilford Silvius, M.D.,   Impression/Recommendations: Recommendations:   Ms. Saxe is a 81 yo woman with PMH of recent hip replacement and colostomy who presented with abdominal pain and confusion. Neurologic exam is currently normal and she is fully oriented though somewhat fuzzy on the events of her admission. Neuroimaging with noncontrast head CT was normal. Her hospital course  has been notable for intermittent worsening confusion. I was not able to witness one of her episodes of confusion, their waxing and waning is most suggestive of delirium due to metabolic or infectious causes. Seizure is another possibility though less likely. Stroke is unlikely based on her normal neurologic exam and brain imaging.  continue to monitor for infection and treat as appropriatecheck B12 and folate to evaluate for metabolic causes of encephalopathyroutine EEG to rule out seizure tendency and assist in assessing encephalopathy maintain normal sleep/wake cycle and avoid benzodiazepinesplease note she is very hard of hearing and is too polite to ask what you've said, which may at times falsely accentuate encephalopathy you for the opportunity to participate in Ms. Robello's care. Please page neurology consults with further questions. Mar Daring, MD  Electronic Signatures: Carmin Richmond (MD)  (Signed [phone] 10:16)  Authored: REFERRING PHYSICIAN, Consult, History of Present Illness, Review of Systems, PAST MEDICAL/SURGICAL HISTORY, HOME MEDICATIONS, ALLERGIES, Social/Family History, NURSING VITAL SIGNS, Physical Exam-, LAB RESULTS, RADIOLOGY RESULTS, Recommendations   Last Updated: 08-May-15 10:16 by Carmin Richmond (MD)

## 2014-11-20 NOTE — Discharge Summary (Signed)
PATIENT NAME:  Tina [name], Tina [name] MR#:  [id number] DATE OF BIRTH:  31-Dec-1932  DATE OF ADMISSION:  08/18/2013 DATE OF DISCHARGE:    DISPOSITION:  Discharged to skilled nursing facility for physical therapy and IV antibiotics.   DISCHARGE DIAGNOSES: 1.  Staphylococcus epidermidis bacteremia secondary to peripherally inserted central catheter line infection.  2.  Enterocutaneous fistula, resolved.  3.  Acute on chronic back pain.  4.  Moderate malnutrition.  5.  Toxic metabolic encephalopathy.  6.  Escherichia coli urinary tract infection.  7.  Anemia of chronic disease, stable.   DISCHARGE MEDICATIONS: Include:  1.  Aspirin 81 mg day.  2.  Protonix 40 mg daily.  3.  Prednisone 5 mg daily.  4.  Zoloft 100 mg daily.  5.  Probiotic formula 1 capsule oral 2 times a day.  6.  Magnesium oxide 400 mg oral 2 times a day.  7.  Metoprolol tartrate 50 mg oral 3 times a day.  8.  NovoLog subcutaneous sliding scale.  9.  Vancomycin 1000 mg IV daily till 09/04/2013.  10.  Flexeril 5 mg oral 2 times a day as needed.  11.  Oxycodone 5 mg oral every 6 hours as needed for pain.  12.  Lisinopril 10 mg oral once a day.  13.  Ensure 240 mL oral 3 times a day with meals.   ADMITTING HISTORY AND PHYSICAL AND HOSPITAL COURSE: Please see detailed H and P dictated previously. In brief, a 81 year old Caucasian female patient with recent perforated diverticulitis, status post colostomy, who was [facility] with complications of enterocutaneous fistula, candida PICC line infection, presented to the hospital with encephalopathy. The patient was found to have a UTI and admitted to the hospitalist service.   HOSPITAL COURSE:   1.  Staph epidermidis PICC line infection. Initially, the patient was treated for a UTI with ceftriaxone but later blood cultures came back positive for gram-positive cocci in clusters, after which vancomycin was started on the patient.  With bacteremia her PICC line was removed. Consulted  Dr. Ola Spurr of infectious disease. Repeat cultures have been negative. The patient will continue vancomycin till 09/04/2013 and a new PICC line has been placed after blood cultures have been negative for over 3 days. The patient is afebrile with normal white blood cell count.  2.  E. coli UTI.  The patient has been on ceftriaxone for 6 days and has been stopped.  3.  Acute encephalopathy has resolved with treatment of infection.   4.  Acute on chronic back pain. The patient had an x-ray which showed no acute abnormalities. She is on Flexeril, pain medications and working with physical therapy.  5.  Candida fungemia. The patient did have a candida fungemia during her stay at [facility]. The PICC line has been removed. Presently, she is cleared from her fungemia. The patient will need an ophthalmologist evaluation for discharge summary from the [place] which needs to be set up from rehab.  6.  Anemia of chronic disease is stable.   Today on examination, the patient feels well except her chronic back pain, is working with physical therapy. Cardiac examination is normal.   DISCHARGE INSTRUCTIONS: The patient needs to continue her vancomycin till 09/04/2013 and then PICC needs to be removed. The patient needs an ophthalmology evaluation for her fungemia per discharge summary of the [facility].  She will be on cardiac diet. Activity as tolerated with assistance. Follow up with [facility] for her colostomy. The patient will need wound  care for her colostomy.   TIME SPENT TODAY ON THIS CASE: 45 minutes.    ____________________________ Tina Alf Satya Bohall, MD srs:cs D: 08/24/2013 13:44:02 ET T: 08/24/2013 14:10:08 ET JOB#: [id number]  cc: Alveta Heimlich R. Montgomery Rothlisberger, MD, <Dictator> Neita Carp MD ELECTRONICALLY SIGNED 08/25/2013 10:21

## 2014-11-20 NOTE — Consult Note (Signed)
Brief Consult Note: Diagnosis: Asymptomatic  L renal calculus and chronic R UPJ obst.   Patient was seen by consultant.   Consult note dictated.   Discussed with Attending MD.   Comments: Intervention is not indicated at the present time. Follow-up with urologist after discharge and consider elective lithotripsy when medically stable.  Electronic Signatures: Royston Cowper (MD)  (Signed [phone] 18:01)  Authored: Brief Consult Note   Last Updated: 05-May-15 18:01 by Royston Cowper (MD)

## 2014-11-20 NOTE — Op Note (Signed)
PATIENT NAMEALANDRA, Tina [name] I MR#:  [id number] DATE OF BIRTH:  04-Jul-1933  DATE OF PROCEDURE:  06/29/2013  PREOPERATIVE DIAGNOSES:   1.  Perforated diverticulitis. 2.  Peritonitis. 3.  Postop ileus, prolonged.   POSTOPERATIVE DIAGNOSES:   1.  Perforated diverticulitis. 2.  Peritonitis. 3.  Postop ileus, prolonged.   PROCEDURES:  1. Ultrasound guidance for vascular access to right basilic vein.  2. Fluoroscopic guidance for placement of catheter.  3. Insertion of single lumen PICC line, right arm.  SURGEON: Hortencia Pilar, M.D.  ANESTHESIA: Local.   ESTIMATED BLOOD LOSS: Minimal.   INDICATION FOR PROCEDURE: Requiring total parenteral nutrition, long term and, therefore, parenteral access.  DESCRIPTION OF PROCEDURE: The patient's right arm was sterilely prepped and draped, and a sterile surgical field was created. The right basilic vein was accessed under direct ultrasound guidance without difficulty with a micropuncture needle and permanent image was recorded. 0.018 wire was then placed into the superior vena cava. Peel-away sheath was placed over the wire. A single lumen peripherally inserted central venous catheter was then placed over the wire and the wire and peel-away sheath were removed. The catheter tip was placed into the superior vena cava and was secured at the skin at 34 cm with a sterile dressing. The catheter withdrew blood well and flushed easily with heparinized saline. The patient tolerated procedure well.        ____________________________ Katha Cabal, MD ggs:dmm D: 07/10/2013 18:02:54 ET T: 07/10/2013 19:07:50 ET JOB#: [id number]  cc: Katha Cabal, MD, <Dictator> Katha Cabal MD ELECTRONICALLY SIGNED 08/04/2013 19:28

## 2014-11-20 NOTE — Consult Note (Signed)
PATIENT NAME:  Tina [name], Tina [name] I MR#:  [id number] DATE OF BIRTH:  10/03/1932  DATE OF CONSULTATION:  08/20/2013  REFERRING PHYSICIAN:  Hillary Bow, MD CONSULTING PHYSICIAN:  Cheral Marker. Ola Spurr, MD  REASON FOR CONSULTATION: Bacteremia, fever, urinary tract infection.  HISTORY OF PRESENT ILLNESS: This is a pleasant 81 year old female who had an issue with perforated diverticulitis in December 2014, underwent Hartmann's pouch. She was then transferred to a rehab center. While there she had apparently a postoperative ileus and required TPN. She also was found to have Candida tropicalis fungemia at that time and was treated with antifungals. She had finished those prior to her discharge to [facility]. At [facility], she developed nausea, vomiting and confusion. She was brought to the Emergency Room where urinalysis apparently showed a UTI. The patient had blood cultures done which ended up growing Coag-negative staph in 2 out of 2 blood cultures.   Of note, she does have left PICC in place through which she gets TPN.   The patient is without complaint today, except for ongoing back pain which is chronic. She denies any fevers or chills. She is not the best historian and cannot quite tell me what has happened over the last month or 2. I did review her discharge summary from [facility].   PAST MEDICAL HISTORY: 1.  Osteoarthritis.  2.  Chronic low back pain.  3.  Perforated diverticulitis, as above.   PAST SURGICAL HISTORY: 1.  Total knee replacement. 2.  Total hysterectomy.  3.  Hip surgery. 4.  Hartmann procedure.   ALLERGIES: ERYTHROMYCIN.   SOCIAL HISTORY: The patient apparently was living alone, but is now at [facility]. No tobacco, alcohol or drugs.   FAMILY HISTORY: Noncontributory.   REVIEW OF SYSTEMS: Eleven systems reviewed and negative, except as per HPI.  PHYSICAL EXAMINATION: VITAL SIGNS: T-max on admission was 100.2. T-max over the last 24 hours 97.6,  pulse of 105, blood pressure 156/91 and sat 90 to 95% on room air.  GENERAL: She is overweight, lying in bed, somewhat confused, but in no acute distress.  HEENT: Pupils equal, round and reactive to light and accommodation. Extraocular movements are intact. Sclerae are anicteric. Oropharynx is clear with no thrush.  NECK: Supple.  HEART: Regular.  LUNGS: Clear.  ABDOMEN: Soft. She has an abdominal midline incision that is well healed, except at the bottom were it is packed with Nu Gauze. There is no odor or drainage. She has a left colostomy tube which is draining liquid stool.  EXTREMITIES: No clubbing, cyanosis or edema.  SKIN: She has multiple ecchymoses, but no evidence of heel or sacral decubs. She has no rash. VASCULAR ACCESS: She has a PICC line in her left upper extremity, which has no erythema, redness or tenderness. However, the nurse reports it is not flushing well.  LABORATORY AND DIAGNOSTICS: Labs are reviewed. Blood culture January 20th is growing Staphylococcus epidermis in 2 bottles. White count on admission was 15 and now is 10. Hemoglobin is 8.4. Urinalysis done on admission with a Foley catheter was 299 white cells. Culture is growing gram-negative rods yet to be identified. Renal function shows a creatinine of 0.82.  Imaging: CT of the head is negative. CT of the chest shows chronic pelvic left stone without significant hydronephrosis. There is moderate right-sided hydronephrosis. Nodules at the right lung base, which are quite small.   IMPRESSION: A 81 year old with complicated hospital course with a PICC line in place now with Coag-negative Staphylococcus bacteremia in  2 of 2 bottles. Repeat blood cultures are pending. She did have recent Candida tropicalis fungemia at [facility], but this has apparently been treated.   RECOMMENDATIONS: 1.  I recommend we remove the PICC line. I am not sure if she continues to need TPN as she does not seem to have a fistula that I can find. I would  consult surgery regarding this.  2.  Continue vancomycin.  3.  Once the catheter is removed, I would repeat blood cultures, and if they remain negative we could do a 7 to 10 day course of IV vancomycin for her line infection.   Thank you for the consult. Please call with questions.  ____________________________ Cheral Marker. Ola Spurr, MD dpf:sb D: 08/20/2013 09:40:01 ET T: 08/20/2013 10:28:34 ET JOB#: [id number]  cc: Cheral Marker. Ola Spurr, MD, <Dictator> Caoimhe Damron Ola Spurr MD ELECTRONICALLY SIGNED 08/31/2013 21:36

## 2014-11-20 NOTE — Op Note (Signed)
PATIENT NAME:  Tina [name], Tina [name] I MR#:  [id number] DATE OF BIRTH:  Apr 17, 1933  DATE OF PROCEDURE:  08/24/2013  PREOPERATIVE DIAGNOSES:   1.  Staph epidermidis bacteremia.  2.  Enterocutaneous fistula.  3.  Toxic metabolic encephalopathy.  4.  Urinary tract infection with Escherichia coli.   POSTOPERATIVE DIAGNOSES: 1.  Staph epidermidis bacteremia.  2.  Enterocutaneous fistula.  3.  Toxic metabolic encephalopathy.  4.  Urinary tract infection with Escherichia coli.   PROCEDURES:  1. Ultrasound guidance for vascular access to right basilic vein.  2. Fluoroscopic guidance for placement of catheter.  3. Insertion of a single-lumen PICC line, right arm.  SURGEON: Hortencia Pilar, MD  ANESTHESIA: Local.   ESTIMATED BLOOD LOSS: Minimal.   INDICATION FOR PROCEDURE: Requiring multiple IV antibiotics greater than 5 days.   DESCRIPTION OF PROCEDURE: The patient's right arm was sterilely prepped and draped, and a sterile surgical field was created. The basilic vein was accessed under direct ultrasound guidance without difficulty with a micropuncture needle and permanent image was recorded. 0.018 wire was then placed into the superior vena cava. Peel-away sheath was placed over the wire. A single lumen peripherally inserted central venous catheter was then placed over the wire and the wire and peel-away sheath were removed. The catheter tip was placed into the superior vena cava and was secured at the skin at 35 cm with a sterile dressing. The catheter withdrew blood well and flushed easily with heparinized saline. The patient tolerated procedure well.     ____________________________ Katha Cabal, MD ggs:cs D: 08/26/2013 08:14:48 ET T: 08/26/2013 19:38:03 ET JOB#: [id number]  cc: Katha Cabal, MD, <Dictator> Katha Cabal MD ELECTRONICALLY SIGNED 09/09/2013 13:24

## 2014-11-20 NOTE — H&P (Signed)
PATIENT NAME:  Tina [name], Tina [name] MR#:  [id number] DATE OF BIRTH:  [birth year]  DATE OF ADMISSION:  12/01/2013  PRIMARY CARE PHYSICIAN: Margarita Rana, MD  CHIEF COMPLAINT: Brought in by a son secondary to confusion and abdominal pain.   HISTORY OF PRESENT ILLNESS: This is a 81 year old female who initially was unable to give me any history whatsoever secondary to altered mental status. History obtained from son at the bedside who states that the confusion has been going on for less than 12 hours. She was 2 days ago having some dry heaves and some gagging, decreased appetite, and some abdominal pain. In the ER, she did have a CAT scan of the abdomen and pelvis that showed worsening right-sided hydronephrosis with chronic UPJ obstruction, stable large calculus in the left renal pelvis but no acute obstruction. The patient in the ER was afebrile, did have a slight white count. Urine and chest x-ray were unremarkable. Hospitalist services were contacted for further evaluation.   PAST MEDICAL HISTORY: Rheumatoid arthritis, hypertension, perforated bowel secondary to diverticulitis with complicating fistula and infections afterwards. The patient has had a rough 6 months after her right hip replacement, had the perforated bowel, the fistula, the infections, been in and out of rehabs.   PAST SURGICAL HISTORY: Right hip replacement November 2014, perforated bowel surgery with colostomy, left knee replacement, hysterectomy.   ALLERGIES: ERYTHROMYCIN.   MEDICATIONS: Include lisinopril 10 mg daily, metoprolol 50 mg 3 times a day, oxycodone 5 mg every 6 hours as needed for pain, Protonix 40 mg daily, prednisone 5 mg daily, promethazine 25 mg every 6 hours as needed for nausea and vomiting, Zoloft 100 mg daily.   SOCIAL HISTORY: She lives alone for the past 10 days after being released from rehab. No smoking. Rare alcohol. No drug use. She was a Therapist, nutritional.   FAMILY HISTORY: Her father died of a broken heart  after her mother passed away. Mother died of a heart attack after breast cancer procedure.   REVIEW OF SYSTEMS: Unable to obtain initially from altered mental status.   PHYSICAL EXAMINATION: VITAL SIGNS: On presentation to the ER, temperature 97.5, pulse 88, respirations 20, blood pressure 186/59, pulse ox 96% on room air.  GENERAL: No respiratory distress, lying flat in bed.  EYES: Conjunctivae and lids normal. Pupils equal, round, and reactive to light. Extraocular muscles intact. No nystagmus.  EARS, NOSE, MOUTH AND THROAT: Tympanic membranes: No erythema. Nasal mucosa: No erythema. Throat: No erythema. No exudate seen. Lips and gums: No lesions.  NECK: No JVD. No bruits. No lymphadenopathy. No thyromegaly. No thyroid nodules palpated.  RESPIRATORY: Lungs clear to auscultation. No use of accessory muscles to breathe. No rhonchi, rales, or wheeze heard.  CARDIOVASCULAR: S1 and S2 normal. No gallops, rubs, or murmurs heard. Carotid upstroke 2+ bilaterally. No bruits. Dorsalis pedis pulses 2+ bilaterally. 2+ edema bilateral lower extremity.  ABDOMEN: Soft, nontender at this time. No organosplenomegaly. Normoactive bowel sounds. No masses felt.  LYMPHATIC: No lymph nodes in the neck.  MUSCULOSKELETAL: 2+ edema. No clubbing. No cyanosis.  SKIN: Bruising on the right side of the abdomen, lower abdomen, bilateral hands.  PSYCHIATRIC: The patient is alert to person only.  NEUROLOGIC: Cranial nerves II through XII grossly intact. Deep tendon reflexes 1+ bilateral lower extremity. Babinski negative. The patient able to lift arms up over her head, legs barely off the bed.   DIAGNOSTIC DATA: EKG: Normal sinus rhythm, 90 beats per minute, no acute ST-T wave changes.   CT scan  of the abdomen and pelvis: Worsening right-sided hydronephrosis with chronic UPJ obstruction, stable large calculus in the left renal pelvis but no obstruction, stable common bile duct dilatation, stable lower quadrant colostomy. No  findings for obstruction or perforation. Moderate-sized hiatal hernia.   Urinalysis: 1+ blood, leukocyte esterase and nitrites are negative.   Chest x-ray shows no acute cardiopulmonary process.   Troponin negative. White blood cell count 12, H and H 12 and 36.6, platelet count 343,000. Glucose 106, BUN 12, creatinine 0.89, sodium 134, potassium 2.9, chloride 98, CO2 30, calcium 9. Liver function tests normal range. Albumin low at 3.2. INR 1.   CT scan of the head: No acute intracranial findings, brain atrophy and chronic small vessel disease.   ASSESSMENT AND PLAN: 1.  Acute encephalopathy with slight leukocytosis. [name] am not sure what the cause of this is at this point. Near the end of my examination, by the time [name] was leaving, the patient states that there is nothing wrong with my mental status and started answering questions more appropriately at that point. If confusion continues, can consider a MRI of the brain. [name] will give IV fluid hydration and see if things improve. [name] will try to hold off on antibiotics. [name] will send off a urine culture, blood cultures, stool cultures including stool for Clostridium difficile, and continue to monitor.  2.  Abdominal pain. The patient states that she does not have any abdominal pain at this point. [name] am not sure if the worsening right-sided hydronephrosis, with the chronic ureteropelvic junction obstruction, would cause this pain or not. [name] doubt the stable large calculus in the left renal pelvis would cause this. We will get a urology consultation.  3.  Hypokalemia. Will check a magnesium, replace potassium orally and in IV fluids, and continue to monitor.  4.  Hypertension. Continue lisinopril and metoprolol.  5.  Rheumatoid arthritis. On chronic low-dose prednisone.  TIME SPENT ON ADMISSION: 55 minutes.  ____________________________ Tana Conch. Leslye Peer, MD rjw:sb D: 12/01/2013 13:42:29 ET T: 12/01/2013 14:16:36 ET JOB#: [id number]  cc: Tana Conch. Leslye Peer,  MD, <Dictator> Jerrell Belfast, MD Marisue Brooklyn MD ELECTRONICALLY SIGNED 12/06/2013 14:47

## 2014-11-20 NOTE — Discharge Summary (Signed)
Dates of Admission and Diagnosis:  Date of Admission 01-Dec-2013   Date of Discharge 04-Dec-2013   Admitting Diagnosis delirium   Final Diagnosis episodes of confusion- forgetful ness- likely delirium causing acute encephalopathy Abdominal pain- questionable- as pt refused later. Renal stone and obstructions- chronic- as per urology- follwo in office- no procedures indicated acutely. Htn Hypokalemia Rheumatoid arthritis    Chief Complaint/History of Present Illness HISTORY OF PRESENT ILLNESS: This is a 81 year old female who initially was unable to give me any history whatsoever secondary to altered mental status. History obtained from son at the bedside who states that the confusion has been going on for less than 12 hours. She was 2 days ago having some dry heaves and some gagging, decreased appetite, and some abdominal pain. In the ER, she did have a CAT scan of the abdomen and pelvis that showed worsening right-sided hydronephrosis with chronic UPJ obstruction, stable large calculus in the left renal pelvis but no acute obstruction. The patient in the ER was afebrile, did have a slight white count. Urine and chest x-ray were unremarkable. Hospitalist services were contacted for further evaluation.   Allergies:  Erythromycin: N/V/Diarrhea  Hepatic:  05-May-15 05:13   Bilirubin, Total 0.3  Alkaline Phosphatase 76 (45-117 NOTE: New Reference Range 06/19/13)  SGPT (ALT) 19  SGOT (AST) 27  Total Protein, Serum 7.9  Albumin, Serum  3.2  Routine Micro:  05-May-15 15:55   Micro Text Report BLOOD CULTURE   COMMENT                   NO GROWTH AEROBICALLY/ANAEROBICALLY IN 5 DAYS   ANTIBIOTIC                       Culture Comment NO GROWTH AEROBICALLY/ANAEROBICALLY IN 5 DAYS  Result(s) reported on 06 Dec 2013 at 04:00PM.    16:00   Micro Text Report BLOOD CULTURE   COMMENT                   NO GROWTH AEROBICALLY/ANAEROBICALLY IN 5 DAYS   ANTIBIOTIC                       Culture  Comment NO GROWTH AEROBICALLY/ANAEROBICALLY IN 5 DAYS  Result(s) reported on 06 Dec 2013 at 04:00PM.  06-May-15 11:43   Micro Text Report URINE CULTURE   COMMENT                   MIXED BACTERIAL ORGANISMS   COMMENT                   RESULTS SUGGESTIVE OF CONTAMINATION   ANTIBIOTIC                       Culture Comment MIXED BACTERIAL ORGANISMS  Culture Comment . RESULTS SUGGESTIVE OF CONTAMINATION  Result(s) reported on 03 Dec 2013 at 11:38AM.  Specimen Source CLEAN CATCH    15:21   Micro Text Report CLOSTRIDIUM DIFFICILE   C.DIFFICILE ANTIGEN       C.DIFFICILE GDH ANTIGEN : NEGATIVE   C.DIFFICILE TOXIN A/B     C.DIFFICILE TOXINS A AND B : NEGATIVE   INTERPRETATION            Negative for C. difficile.    ANTIBIOTIC                        Micro Text  Report STOOL COMPREHENSIVE   COMMENT                   NO SALMONELLA OR SHIGELLA ISOLATED   COMMENT                   NO PATHOGENIC E.COLI DETECTED   COMMENT                   NO CAMPYLOBACTER ANTIGEN DETECTED   ANTIBIOTIC                        Micro Text Report WBCS, STOOL   COMMENT                   NO RBC'S OR WBC'S SEEN   ANTIBIOTIC                       Culture Comment NO SALMONELLA OR SHIGELLA ISOLATED  Culture Comment . NO PATHOGENIC E.COLI DETECTED  Culture Comment    . NO CAMPYLOBACTER ANTIGEN DETECTED  Result(s) reported on 04 Dec 2013 at 10:01AM.  Comment .1. NO RBC'S OR WBC'S SEEN  Result(s) reported on 03 Dec 2013 at 03:44PM.  Routine Chem:  05-May-15 05:13   Glucose, Serum  106  BUN 12  Creatinine (comp) 0.89  Sodium, Serum  134  Potassium, Serum  2.9  Chloride, Serum 98  CO2, Serum 30  Calcium (Total), Serum 9.0  Osmolality (calc) 268  eGFR (African American) >60  eGFR (Non-African American) >60 (eGFR values <67m/min/1.73 m2 may be an indication of chronic kidney disease (CKD). Calculated eGFR is useful in patients with stable renal function. The eGFR calculation will not be reliable in acutely ill  patients when serum creatinine is changing rapidly. It is not useful in  patients on dialysis. The eGFR calculation may not be applicable to patients at the low and high extremes of body sizes, pregnant women, and vegetarians.)  Anion Gap  6  Magnesium, Serum  1.7 (1.8-2.4 THERAPEUTIC RANGE: 4-7 mg/dL TOXIC: > 10 mg/dL  -----------------------)  06-May-15 00:28   Potassium, Serum  3.1  [id number]  Folic Acid, Serum 178.2(Result(s) reported on 04 Dec 2013 at 12:37PM.)  Cardiac:  05-May-15 05:13   Troponin I 0.03 (0.00-0.05 0.05 ng/mL or less: NEGATIVE  Repeat testing in 3-6 hrs  if clinically indicated. >0.05 ng/mL: POTENTIAL  MYOCARDIAL INJURY. Repeat  testing in 3-6 hrs if  clinically indicated. NOTE: An increase or decrease  of 30% or more on serial  testing suggests a  clinically important change)  Routine Coag:  05-May-15 05:13   Prothrombin 13.2  INR 1.0 (INR reference interval applies to patients on anticoagulant therapy. A single INR therapeutic range for coumarins is not optimal for all indications; however, the suggested range for most indications is 2.0 - 3.0. Exceptions to the INR Reference Range may include: Prosthetic heart valves, acute myocardial infarction, prevention of myocardial infarction, and combinations of aspirin and anticoagulant. The need for a higher or lower target INR must be assessed individually. Reference: The Pharmacology and Management of the Vitamin K  antagonists: the seventh ACCP Conference on Antithrombotic and Thrombolytic Therapy. CUMPNT.6144Sept:126 (3suppl): [id number] A HCT value >55% may artifactually increase the PT.  In one study,  the increase was an average of 25%. Reference:  "Effect on Routine and Special Coagulation Testing Values of Citrate Anticoagulant Adjustment in Patients with High HCT Values." American Journal  of Clinical Pathology 7048;889:169-450.)  Routine Hem:  05-May-15 05:13   WBC (CBC)  12.0  RBC  (CBC) 4.10  Hemoglobin (CBC) 12.0  Hematocrit (CBC) 36.6  Platelet Count (CBC) 343 (Result(s) reported on 01 Dec 2013 at 07:29AM.)  MCV 89  MCH 29.3  MCHC 32.8  RDW  18.0   PERTINENT RADIOLOGY STUDIES: XRay:    06-May-15 12:35, Chest PA and Lateral  Chest PA and Lateral   REASON FOR EXAM:    ams  COMMENTS:       PROCEDURE: DXR - DXR CHEST PA (OR AP) AND LATERAL  - Dec 02 2013 12:35PM     CLINICAL DATA:  Weakness.  Shortness of breath.    EXAM:  CHEST  2 VIEW    COMPARISON:  DG CHEST 1V PORT dated 12/01/2013    FINDINGS:  Mediastinum and hilar structures normal. Lungs are clear. Heart size  normal. Left PICC line noted with tip at the cavoatrial junction.   IMPRESSION:  1. PICC line in good anatomic position.  2. No acute cardiopulmonary disease      Electronically Signed    By: Marcello Moores  Register    On: 12/02/2013 14:41         Verified By: Osa Craver, M.D., MD  LabUnknown:    05-May-15 10:14, CT Abdomen and Pelvis Without Contrast  PACS Image     06-May-15 12:35, Chest PA and Lateral  PACS Image   CT:    05-May-15 10:14, CT Abdomen and Pelvis Without Contrast  CT Abdomen and Pelvis Without Contrast   REASON FOR EXAM:    (1) diffuse abd pain; (2) diffuse abd pain  COMMENTS:       PROCEDURE: CT  - CT ABDOMEN AND PELVIS W0  - Dec 01 2013 10:14AM     CLINICAL DATA:  Vomiting.    EXAM:  CT ABDOMEN AND PELVIS WITHOUT CONTRAST    TECHNIQUE:  Multidetector CT imaging of the abdomen and pelvis was performed  following the standard protocol without IV contrast.    COMPARISON:  08/18/2013  FINDINGS:  The lung bases are clear. The heart is normal in size. No  pericardial effusion. Coronary artery calcifications are noted.  There is a moderate-sized hiatal hernia.    The liver appears stable. No focal hepatic lesions. Stable  calcification near the gallbladder. No definite gallstones or  findings for acute cholecystitis. Stable moderate common bowel  duct  dilatation. The pancreas is normal. The spleen is normal. Stable  calcification associated with the right adrenal gland. There is  progressive right-sided hydronephrosis due to a UPJ obstruction.  There is also a large, 10 mm calculus in the leftrenal pelvis just  above the UPJ but no obstruction. No ureteral or bladder calculi.    The stomach, duodenum, small bowel and colon are unremarkable. Add  left lower quadrant colostomy is noted with a Hartmann's pouch. No  complicating features are demonstrated. No findings for small bowel  obstruction. No mesenteric or retroperitoneal mass or adenopathy.  Stable aortic and branch vessel calcifications.    The bladder is mildly distended. The uterus is surgically absent. No  pelvic mass, adenopathy or free pelvic fluid collections. No  inguinal mass or adenopathy. The right hip prosthesis is intact.    No acute bony findings.     IMPRESSION:  1. Worsening right-sided hydronephrosis with chronic UPJ  obstruction.  2. Stable large calculus in the left renal pelvis but no acute  obstruction.  3. Stable common bile duct dilatation.  4. Stable left lower quadrant colostomy. No findings for obstruction  or perforation.  5. Moderate size hiatal hernia.      Electronically Signed    By: Kalman Jewels M.D.    On: 12/01/2013 10:57         Verified By: Marlane Hatcher, M.D.,   Pertinent Past History:  Pertinent Past History Rheumatoid arthritis, hypertension, perforated bowel secondary to diverticulitis with complicating fistula and infections afterwards. The patient has had a rough 6 months after her right hip replacement, had the perforated bowel, the fistula, the infections, been in and out of rehabs.   Hospital Course:  Hospital Course * Acute encephalopathy with slight leukocytosis.   All test so far are negative   Pt had some nausea and noticed loose stool in her colostomy bag- so it may be gastritis episode.   She also  took 2 tabs of oxycodon 5 mg for pain- that might also have made her some dizzi.    Now totally alert, no complains.cultures remain negative.  * forgetfulness and auditory hallucinations   Appreciated neurology and Psychiatry consults.  may be it was an delirium episode. now totally normal.  *  Abdominal pain. Renal stone    Appreciated urology consultation- no active issues.    may be it was gastroenteritis- resolved now.  * Kidney stone and UPJ obstruction wih hydronephrosis- chronic- No intervention as per Urology.  *  Hypokalemia. replace potassium orally and in IV fluids, and continue to monitor.  *  Hypertension. Continue lisinopril and metoprolol.  *  Rheumatoid arthritis. On chronic low-dose prednisone. * Dizziness and questionable fall- PT eval.   Condition on Discharge Stable   Code Status:  Code Status Full Code   DISCHARGE INSTRUCTIONS HOME MEDS:  Medication Reconciliation: Patient's Home Medications at Discharge:     Medication Instructions  pantoprazole 40 mg oral delayed release tablet  1 tab(s) orally once a day, As Needed   prednisone 5 mg oral tablet  1 tab(s) orally once a day   zoloft 100 mg oral tablet  1 tab(s) orally once a day   metoprolol tartrate 50 mg oral tablet  1 tab(s) orally 3 times a day   oxycodone 5 mg oral capsule  1 cap(s) orally every 6 hours, As Needed - for Pain   promethazine 25 mg oral tablet  1 tab(s) orally every 6 hours, As Needed - for Nausea, Vomiting   lisinopril 20 mg oral tablet  1 tab(s) orally once a day    STOP TAKING THE FOLLOWING MEDICATION(S):    trazodone 50 mg oral tablet: 50 mg tab(s) orally once a day (at bedtime),  may have 1 -2 tabs; As Needed - for Inability to Sleep  Physician's Instructions:  Home Health? Yes   Home Health Service Physicial Therapy  Nurse   Diet Low Sodium  Low Fat, Low Cholesterol   Activity Limitations As tolerated   Return to Work Not Applicable   Time frame for Follow Up  Appointment 1-2 weeks  with PMD   Other Comments routine follow ups with PMD. If have pain in abdomen again - need to follow with Urology in office. Neurology clinic to arrange for EEG.   Electronic Signatures: Vaughan Basta (MD)  (Signed 11-May-15 23:06)  Authored: ADMISSION DATE AND DIAGNOSIS, CHIEF COMPLAINT/HPI, Allergies, PERTINENT LABS, PERTINENT RADIOLOGY STUDIES, PERTINENT PAST HISTORY, HOSPITAL COURSE, DISCHARGE INSTRUCTIONS HOME MEDS, PATIENT INSTRUCTIONS   Last Updated: 11-May-15 23:06 by  Vaughan Basta (MD)

## 2014-11-20 NOTE — Consult Note (Signed)
PATIENT NAME:  Tina [name], LEVI I MR#:  [id number] DATE OF BIRTH:  October 25, 1932  DATE OF CONSULTATION:  08/23/2013  REFERRING PHYSICIAN:   CONSULTING PHYSICIAN:  Melenie Minniear K. Fintan Grater, MD  SUBJECTIVE:  The patient was seen in consultation in room number 113.  The patient is a 81 year old white female, divorced since 62 and lives by herself.  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  The patient reports that recently she was faced with lots of challenges which include hip replacement and colon resection.  She had to deal with all these challenges.  When she was asked the reason she came to the hospital, she stated, "It was emergency."    PAST PSYCHIATRIC HISTORY:  No previous history of inpatient psychiatry.  No history of suicide attempts.  Not been followed by any psychiatrist.    ALCOHOL AND DRUGS:  Admits that she drinks alcohol at the rate of 1-1/2 ounces of scotch in the evening for the past many years.  Denies any history of drug abuse.  Used to smoke cigarettes many years ago but quit smoking.    MENTAL STATUS:  The patient is seen laying comfortably in bed.  Alert and oriented.  She is hard of hearing.  Affect is appropriate with her mood which is low and down and depressed, and she reports, "We all do feel depressed at some time in life, don't we?"  Then she stated she is trying to work through her depression and she does not want to take any pills for that.  She reports that all the challenges in life including her physical problems, have added up and causing her to feel low and down and depressed but she absolutely denies any ideas or plans to hurt herself or others and she wants to go through her depression by herself.  No psychosis.  Cognition is intact.  General knowledge and information is fair.  Insight and judgment fair and adequate.    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  That  is hip replacement and colon resection.    RECOMMEND:  Continue current treatment.  The patient refuses to be on any antidepressants at this time and she wants to go through her depression by herself.    ____________________________ Wallace Cullens. Franchot Mimes, MD skc:[id number] D: 08/23/2013 17:55:21 ET T: 08/23/2013 19:21:47 ET JOB#: [id number]  cc: Arlyn Leak K. Franchot Mimes, MD, <Dictator> Dewain Penning MD ELECTRONICALLY SIGNED 08/25/2013 6:45

## 2014-11-20 NOTE — Consult Note (Signed)
Brief Consult Note: Diagnosis: Depression secondary to medical condition, delirium secondary to medical condition resolved.   Patient was seen by consultant.   Consult note dictated.   Recommend further assessment or treatment.   Discussed with Attending MD.   Comments: Tina [name] has no psychiatric past. She has ben in and out of the hospital for several months now for medical complications. She was admitted with AMS that has now resolved. She is not suicidal, homicidal or confused.   PLAN: 1. The patient does not meet criteria for IVC. Please discharge as appropriate.  2. Please continue Zoloft 100 mg.  3. I will sign off.  Electronic Signatures: Orson Slick (MD)  (Signed [phone] 16:22)  Authored: Brief Consult Note   Last Updated: 08-May-15 16:22 by Orson Slick (MD)

## 2014-11-20 NOTE — H&P (Signed)
PATIENT NAME:  Tina [name], Tina [name] MR#:  [id number] DATE OF BIRTH:  [birth year]  DATE OF ADMISSION:  08/17/2013  PRIMARY CARE PHYSICIAN: Margarita Rana, MD   CHIEF COMPLAINT: Altered mental status and syncope.   HISTORY OF PRESENT ILLNESS: The patient is a 81 year old female who was recently admitted to the hospital for perforated diverticulitis, in December 2014, status post Jeanette Caprice procedure, was discharged to rehabilitation center. During the hospital course she had prolonged postoperative ileus requiring TPN. While the patient was still at the rehabilitation center, she was found to be more confused yesterday, and the patient was nauseated and vomiting. Eventually at one point she almost passed out transiently for a few seconds and she was sent over to the ER by the rehab staff. The patient's CAT scan of the head is negative here. Her chest x-ray is also negative. A 12-lead EKG has revealed sinus tachycardia. Her urine is positive for UTI. The patient received IV fluid boluses and she was given IV Rocephin. Hospitalist team is called to admit the patient. During my examination, the patient seemed to be awake and alert, but answering questions sluggishly and she is not quite sure what is going on with her and she says "[name] don't know" to most of the questions. The patient's son is at bedside and history is obtained from the patient's son and from the medical staff.   PAST MEDICAL HISTORY: Osteoarthritis and perforated diverticulitis status post Hartmann procedure.   PAST SURGICAL HISTORY: Recent Hartmann procedure with end colostomy, knee replacement, total hysterectomy, and hip surgery.   ALLERGIES: ERYTHROMYCIN.   PSYCHOSOCIAL HISTORY: Before she was living alone, but currently residing at rehabilitation center. No history of smoking, alcohol, or illicit drug usage.   FAMILY HISTORY: Noncontributory.   REVIEW OF SYSTEMS: Unobtainable as the patient is with altered mental status.   PHYSICAL  EXAMINATION: VITAL SIGNS: Temperature initially 100.2, pulse 107. After receiving subsequent fluid boluses it went down to 98, blood pressure 123/41, pulse ox 99% on 2 liters.  GENERAL APPEARANCE: Not in acute distress. Moderately built and nourished.  HEENT: Normocephalic, atraumatic. Pupils are equal and react to light and accommodation. No scleral icterus. No conjunctival injection. Extraocular movements are intact. No sinus tenderness. Dry mucous membranes.  NECK: Supple. No JVD or thyromegaly.  LUNGS: Clear to auscultation bilaterally. No accessory muscle usage. No anterior chest wall tenderness on palpation.  CARDIAC: S1 and S2 normal. Regular rate and rhythm. No clicks. No gallops.  ABDOMEN: Soft. Bowel sounds are positive in all 4 quadrants. The colostomy site is intact. Midline fistula site is covered with a clean bandage. No masses felt. NEUROLOGIC: Awake and alert but disoriented to time and place, having hard time following verbal commands. Motor and sensory are intact. Reflexes are 2+.  EXTREMITIES: No edema. No cyanosis or clubbing.  SKIN: Warm to touch. Normal turgor. No rashes. No lesions.  MUSCULOSKELETAL: No joint effusion, tenderness, or erythema.   LABORATORY AND IMAGING STUDIES: Glucose 123, BUN 30, creatinine 0.8, sodium 131, potassium 4.2, chloride 98, CO2 29. GFR greater than 60. Anion gap is 4. Serum osmolality 270. Calcium 8.8. Lipase 70. LFTs: Total protein is 8.4, albumin is 2, bilirubin total is 0.4. Alk phos 137, AST and ALT are normal. Troponin less than 0.02. WBC 15, hemoglobin 11, hematocrit 34.1, and platelets are 341. Urinalysis: Yellow in color, cloudy in appearance. Bilirubin and ketones are negative. Specific gravity 1.038. Nitrites are positive. Leukocyte esterase is 3+.  ABG is normal except pO2 of  75.  Portable chest x-ray, single view: No acute cardiopulmonary disease.   Twelve-lead EKG: Sinus tachycardia. No acute ST-T wave changes.   ASSESSMENT AND  PLAN: A 81 year old female brought into the ER from a rehabilitation center for altered mental status and near syncope. She will be admitted with the following assessment and plan.  1.  Near syncope following emesis. Probably vasovagal in nature. We will admit her to telemetry bed. We will provide IV fluids and get neuro checks. Will provide her antinausea medication.  2.  Acute cystitis with nausea. [name] will provide her IV fluids, IV Rocephin. Blood cultures and urine cultures were ordered, which are pending.  3.  Status post surgery for diverticulitis status post Hartmann procedure and colostomy. We will provide colostomy care.  4.  Osteoarthritis. Currently the patient is not complaining of any pain. Will provide pain medications on as needed basis.  5.  We will provide her gastrointestinal prophylaxis and deep vein thrombosis prophylaxis.   CODE STATUS: She is FULL code. Son is the medical power of attorney.   Diagnosis and plan of care was discussed in detail with the patient's son at bedside. He is agreeable to the plan and he verbalized understanding.  TOTAL TIME SPENT ON ADMISSION: 45 minutes.  ____________________________ Nicholes Mango, MD ag:sb D: 08/18/2013 05:32:34 ET T: 08/18/2013 08:07:13 ET JOB#: [id number]  cc: Nicholes Mango, MD, <Dictator> Jerrell Belfast, MD Nicholes Mango MD ELECTRONICALLY SIGNED 09/04/2013 7:21

## 2014-11-21 NOTE — Op Note (Signed)
PATIENT NAME:  Tina [name], Tina [name] MR#:  [id number] DATE OF BIRTH:  06-30-1933  DATE OF PROCEDURE:  09/04/2011  PREOPERATIVE DIAGNOSIS: Right ovarian cyst.   POSTOPERATIVE DIAGNOSIS: Right ovarian cyst.  PROCEDURE: Laparoscopy, bilateral salpingo-oophorectomy.   SURGEON: Barnett Applebaum, M.D.   ASSISTANT: Erik Obey, MD  ANESTHESIA: General.   ESTIMATED BLOOD LOSS: Minimal.   COMPLICATIONS: None.   FINDINGS: There was a cystic right ovary and an atrophic left ovary. Frozen section analysis revealed benign serous cystadenoma.   DISPOSITION: To the Recovery Room in stable condition.   TECHNIQUE: The patient is prepped and draped in the usual sterile fashion after adequate anesthesia is obtained in the dorsal lithotomy position. A sponge is placed per vagina for manipulation purposes and a Foley catheter is also inserted.   Attention is then turned to the abdomen where a Veress needle is inserted through a 5 mm infraumbilical incision after Marcaine is used to anesthetize the skin. Veress needle placement is confirmed using the hanging drop technique and the abdomen is insufflated with CO2 gas. A 5 mm trocar is inserted under direct visualization with the laparoscope with no injuries or bleeding noted. The patient is placed in Trendelenburg position. The patient was noted to have adhesions of the colon over and around the adnexa. A 5 mm trocar is placed in the right lower quadrant lateral to the inferior epigastric blood vessels and left-sided 11 mm trocar is placed as well with excellent placement with no injuries or bleeding noted. Using the Harmonic scalpel, the adhesions are carefully dissected and in an effort to free the ovary without compromising bowel, blood vessels or ureter structures. Once the ovary is easily freed, its blood vessel pedicle was carefully coagulated using bipolar cautery device and then is completely amputated using the Harmonic scalpel. An Endopouch is placed and the  entire adnexa is placed in the pouch. A small incision is made in the cyst to collapse it within the Endopouch without spillage of contents. The ovaries and easily removed through the left lower quadrant incision, in the Endopouch, and is then was taken to pathology for further review.   Excellent hemostasis is noted. The adhesions around the left adnexa are carefully dissected. This adnexa is very atrophic and the tissue that is visualized to be adnexal tissue is excised and removed. Careful surgical technique is used and avoidance of the sidewall and blood vessels is performed even at the expense of possibly leaving ovarian remnant behind as this ovary shows no signs of disease and with normal frozen section analysis results coming back to [place] at this time.   The pelvic cavity is irrigated. Hemostasis is assured with Arista and careful surgical technique and Bovie electrocautery. There is no apparent injury to bowel, bladder, ureter, or other structures. Fluid is aspirated. Gas is expelled. Trocars are removed. The patient tolerates the procedure well and goes to the Recovery Room in stable condition, after Dermabond is used to close the skin incisions. Foley catheter and sponge stick were removed prior to leaving the room.  ____________________________ R. Barnett Applebaum, MD rph:slb D: 09/04/2011 10:21:27 ET T: 09/04/2011 10:41:47 ET JOB#: [id number]  cc: Glean Salen, MD, <Dictator> Gae Dry MD ELECTRONICALLY SIGNED 09/05/2011 7:49

## 2014-12-04 IMAGING — CR DG CHEST 2V
1 series · 2 of 2 positions shown · non-contrast
Comparison: 06/08/2013

CLINICAL DATA: Shortness of breath, wheezing

EXAM:
CHEST  2 VIEW

[Series 4: x chest ap · 0.14mm/px · 2 of 2 slices shown]
[im 1/2]
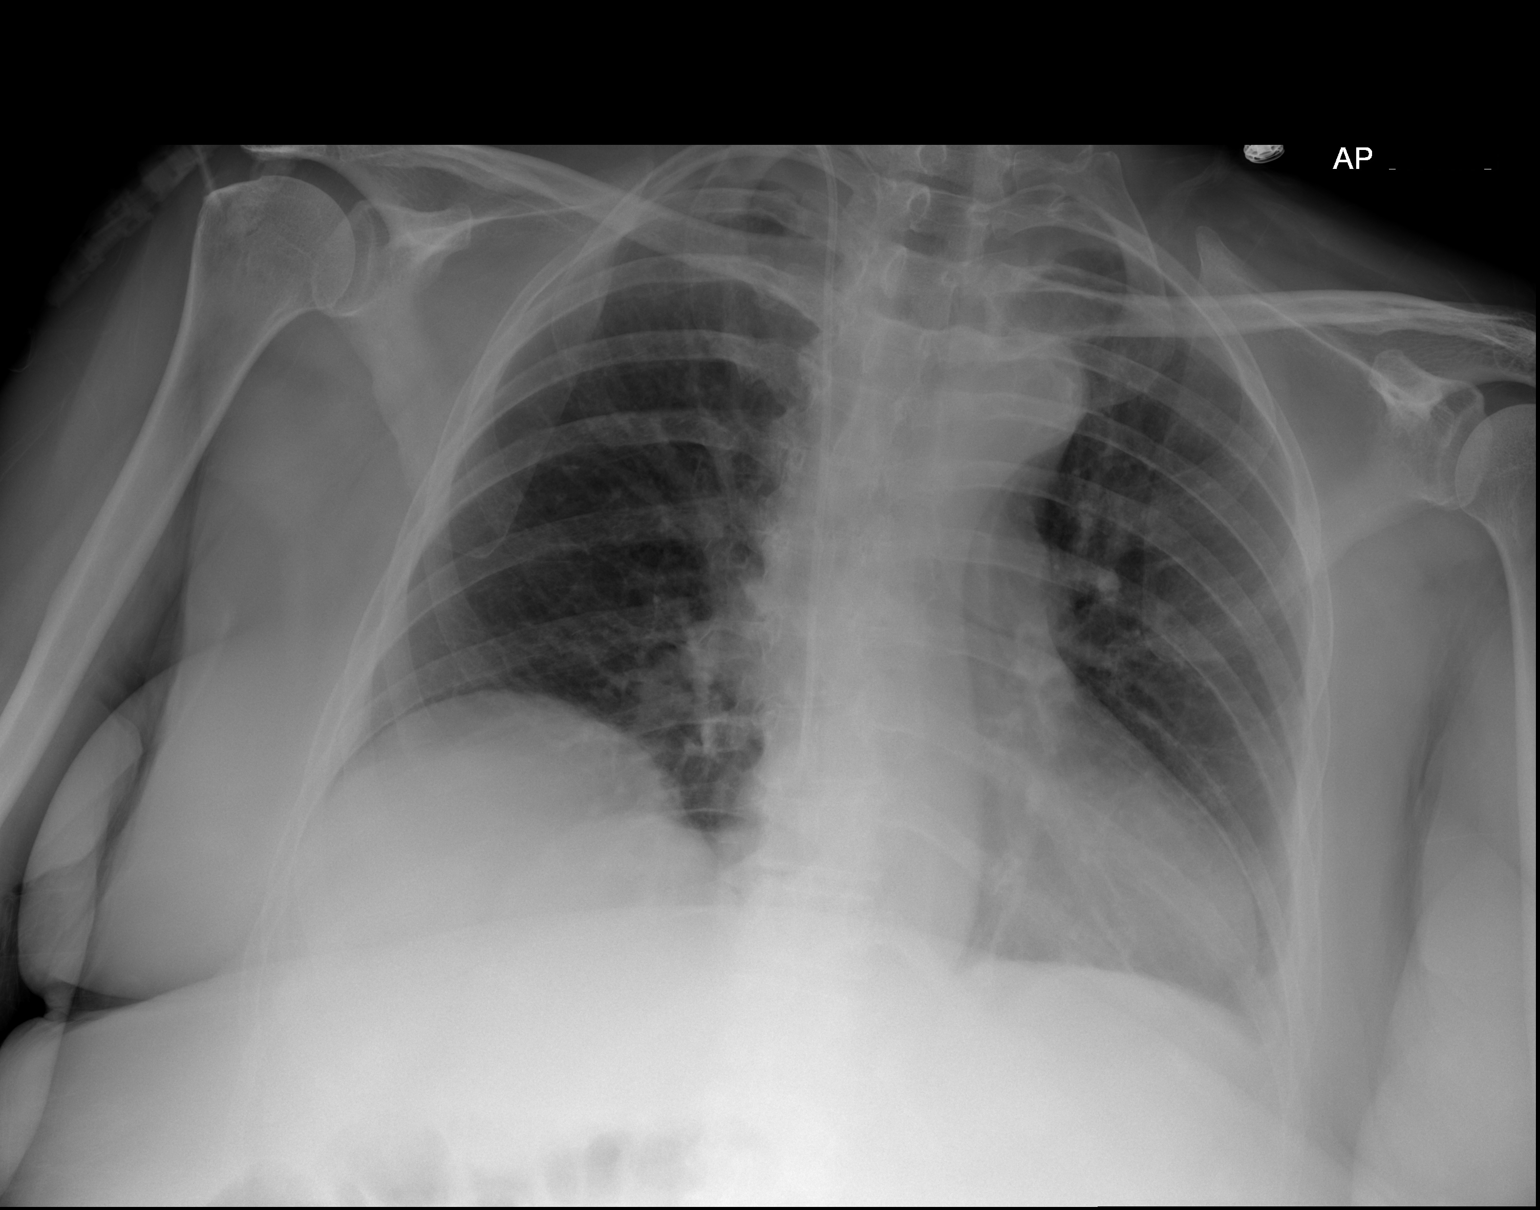
[im 2/2]
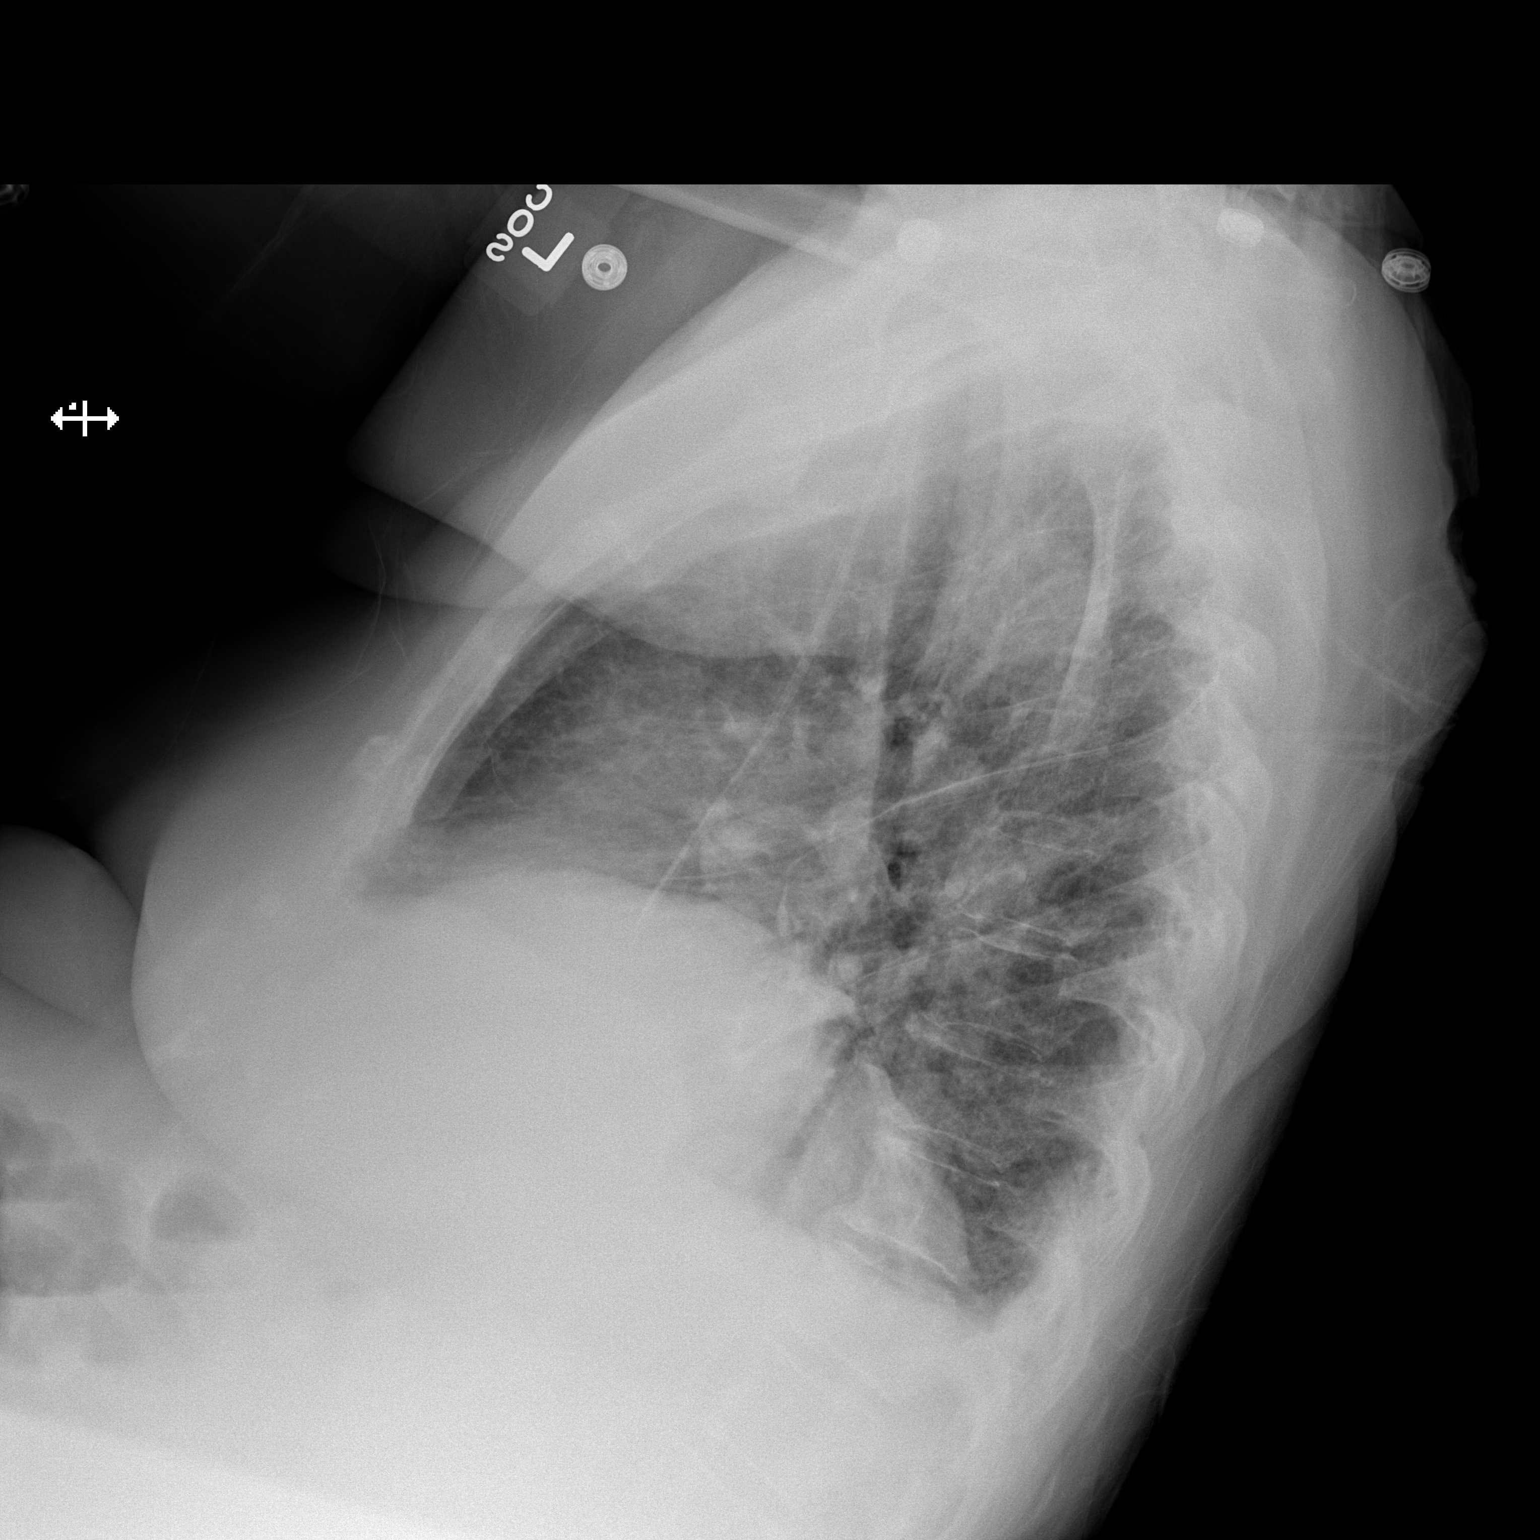

[2 of 2 positions shown; findings below may reference images not displayed]

FINDINGS: Low lung volumes. Cardiac silhouette is mild-to-moderately enlarged.
A right-sided internal jugular catheter is appreciated again with
tip projecting in region of right atrium. The patient's NG tube is
been removed in the interim. Technique taking into consideration
there is mild prominence of interstitial markings. No focal reason
consolidation appreciated. The osseous structures are unremarkable.
There is elevation of right hemidiaphragm.
IMPRESSION: 1. Pulmonary vascular congestion
2. No change in the patient's right internal jugular catheter
3. NG tube is been removed in the interim

## 2014-12-09 DIAGNOSIS — Z433 Encounter for attention to colostomy: Secondary | ICD-10-CM | POA: Diagnosis not present

## 2014-12-09 DIAGNOSIS — I10 Essential (primary) hypertension: Secondary | ICD-10-CM | POA: Diagnosis not present

## 2014-12-09 LAB — BASIC METABOLIC PANEL
BUN: 30 mg/dL — AB (ref 4–21)
CREATININE: 1.1 mg/dL (ref 0.5–1.1)
GLUCOSE: 102 mg/dL
POTASSIUM: 5.1 mmol/L (ref 3.4–5.3)
SODIUM: 142 mmol/L (ref 137–147)

## 2014-12-09 LAB — HEPATIC FUNCTION PANEL
ALT: 12 U/L (ref 7–35)
AST: 16 U/L (ref 13–35)

## 2014-12-09 LAB — CBC AND DIFFERENTIAL
HEMATOCRIT: 40 % (ref 36–46)
Hemoglobin: 12.8 g/dL (ref 12.0–16.0)
Platelets: 301 10*3/uL (ref 150–399)
WBC: 9.5 10^3/mL

## 2014-12-12 IMAGING — CT CT ABD-PELV W/ CM
1 of 3 series · 11 of 32 positions shown, 17 images · IV contrast (isovue)
Comparison: plain films of the abdomen dated June 19, 2013 and
preoperative CT scan of the abdomen and pelvis dated June 07, 2013.

CLINICAL DATA: Feculent vomiting, abdominal pain, altered mental
status

EXAM:
CT ABDOMEN AND PELVIS WITH CONTRAST
TECHNIQUE: Multidetector CT imaging of the abdomen and pelvis was performed
using the standard protocol following bolus administration of
intravenous contrast.
CONTRAST:  125 cc of Isovue 370 intravenously the patient could not
tolerate oral contrast material

[Series 4: routine abd pel with · axial · 0.76mm/px · z∈[-520,-130]mm · 11 of 94 slices shown, 17 images]
[im 8/94  soft-tissue]
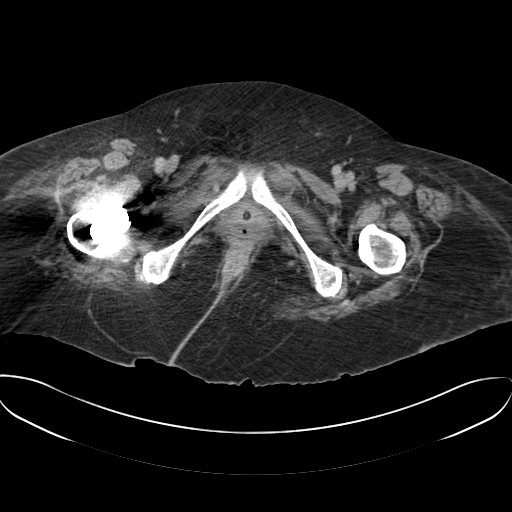
[im 8/94  bone]
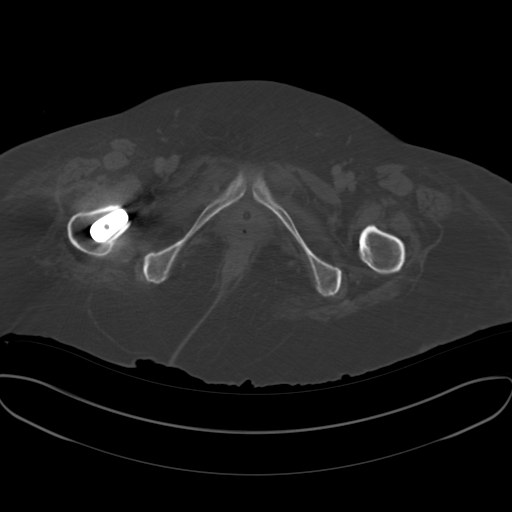
[im 15/94  soft-tissue]
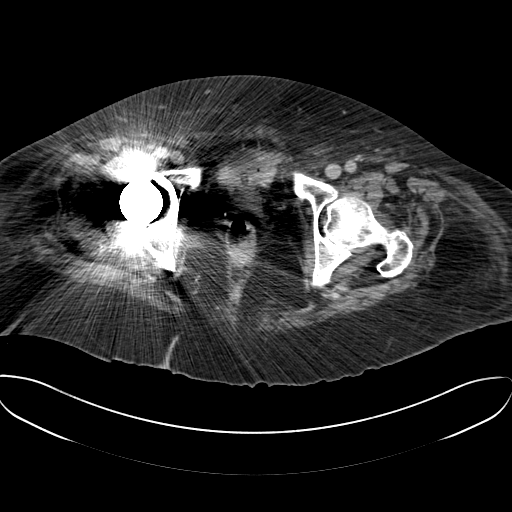
[im 22/94  soft-tissue]
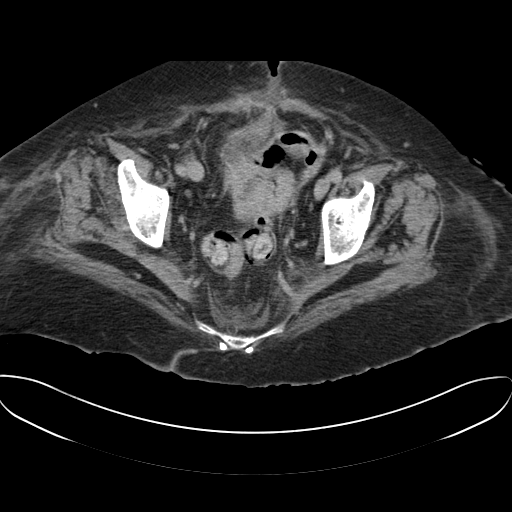
[im 29/94  soft-tissue]
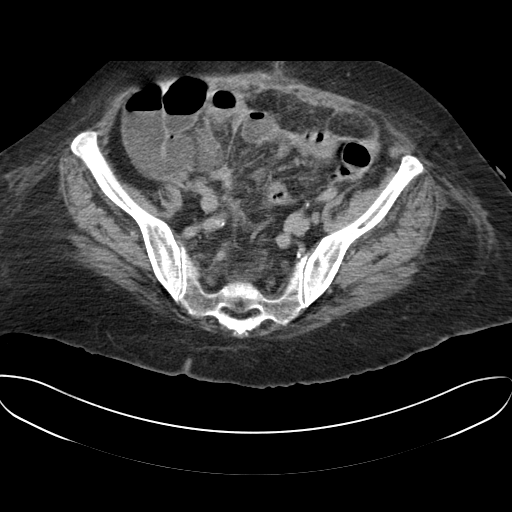
[im 36/94  soft-tissue]
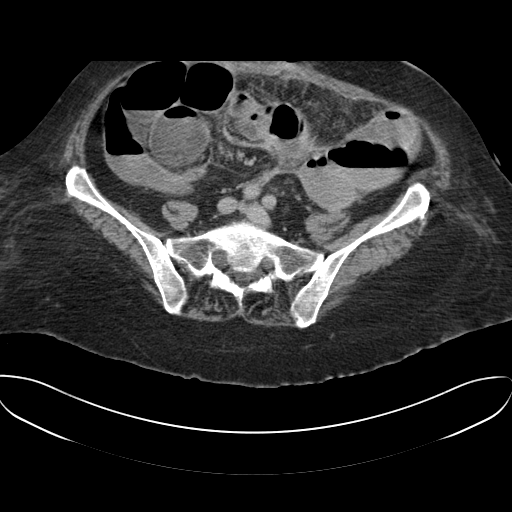
[im 51/94  soft-tissue]
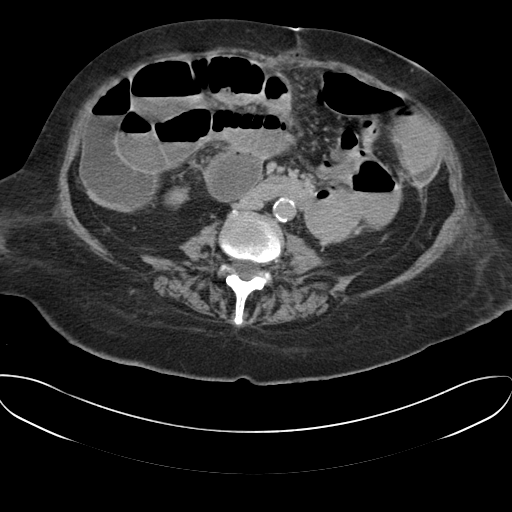
[im 58/94  soft-tissue]
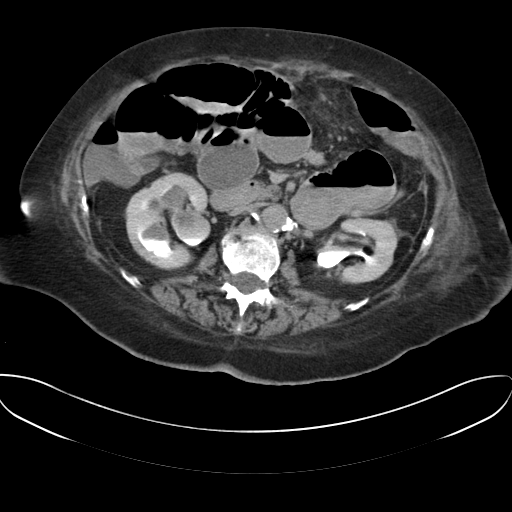
[im 65/94  soft-tissue]
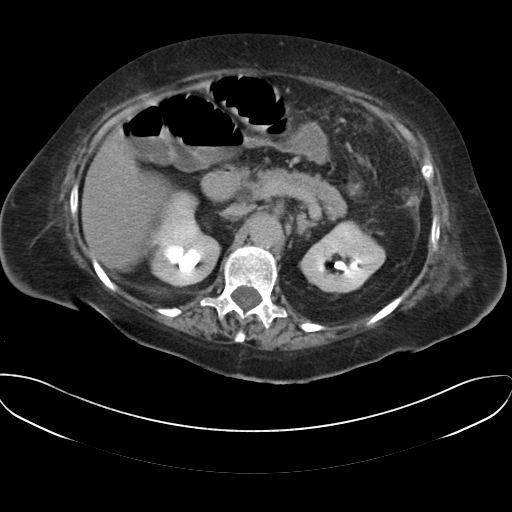
[im 65/94  lung]
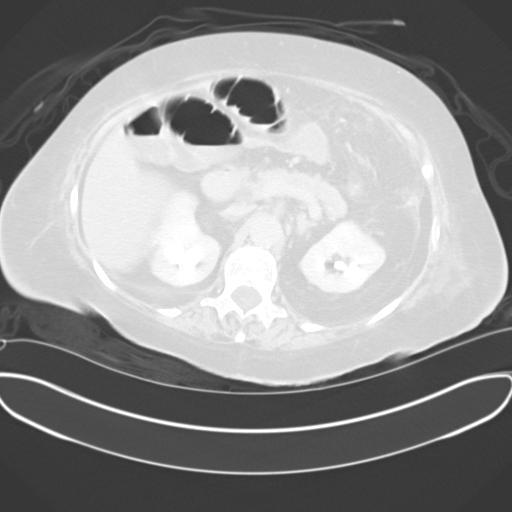
[im 72/94  soft-tissue]
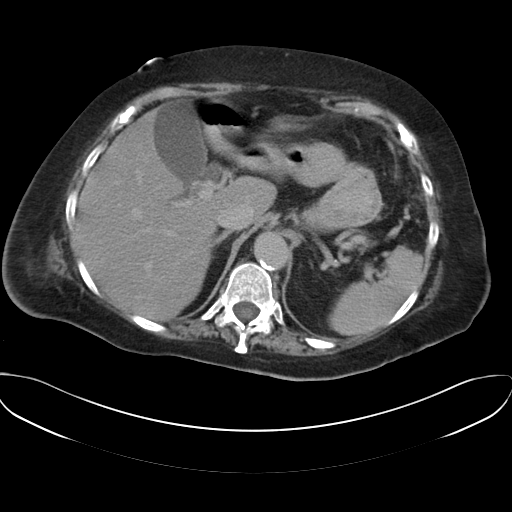
[im 72/94  lung]
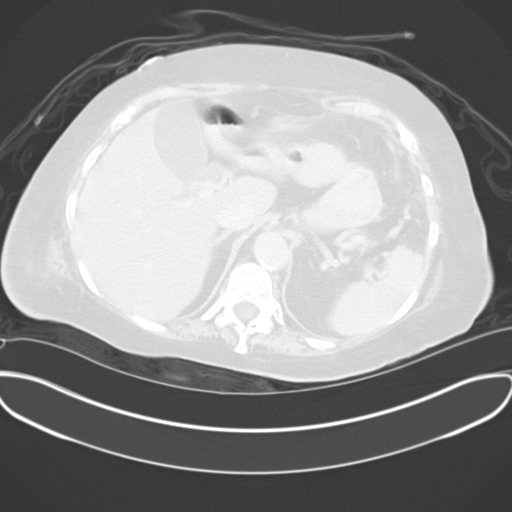
[im 72/94  bone]
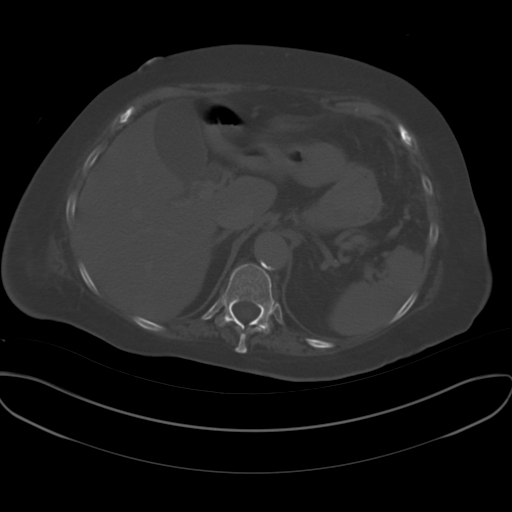
[im 79/94  soft-tissue]
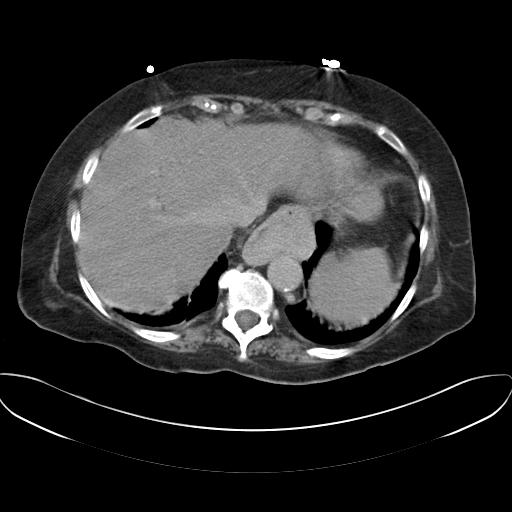
[im 79/94  lung]
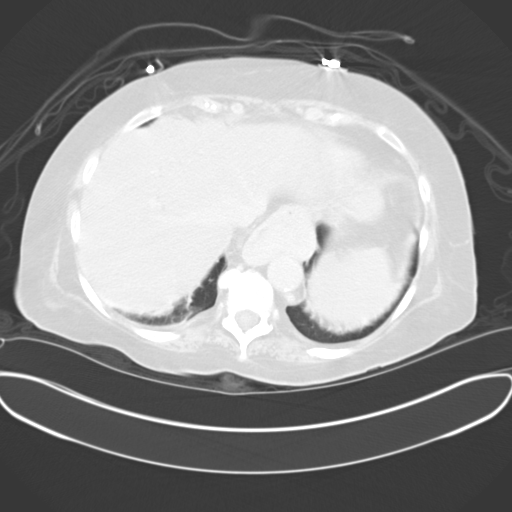
[im 86/94  soft-tissue]
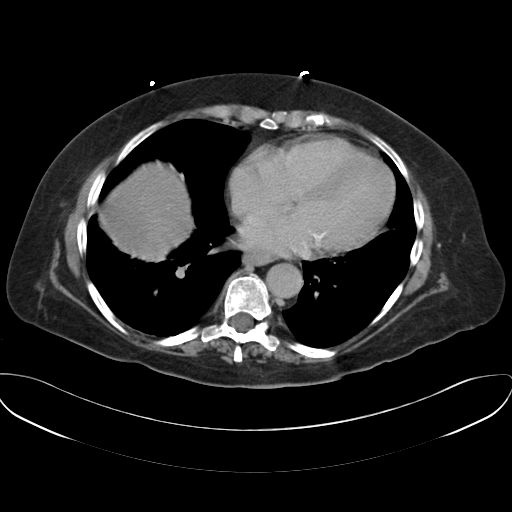
[im 86/94  lung]
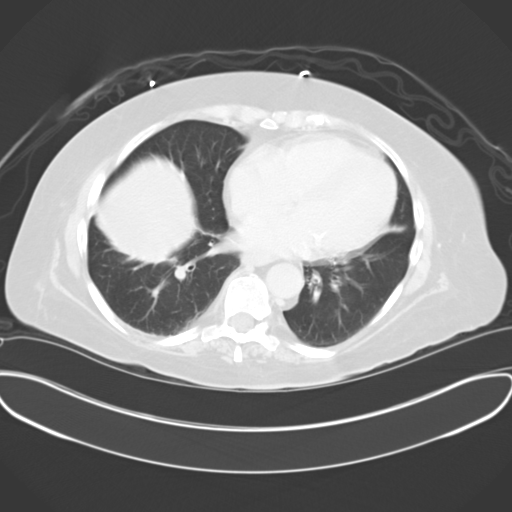

[11 of 32 positions shown; findings below may reference images not displayed]

FINDINGS: There is a moderate-sized hiatal hernia containing fluid. The
stomach is partially distended and contains fluid. The duodenum does
not appear abnormally distended. There are multiple loops of
distended small bowel within the upper and mid abdomen. There is a
small amount of gas within vertical incision within the anterior
abdominal wall. This is closely applied to a loop of small bowel
demonstrated on image 54. The possibility of development of the
fistulous connection to the skin is raised. There are few loops of
nondistended small bowel in the upper pelvis. A discrete transition
point is not demonstrated, but there is poor definition of bowel
loops just above the dome of a collapsed urinary bladder. The
ascending and transverse portions of the colon do not appear
abnormally distended. There is a small amount of gas and fluid
within these loops. There is no hernia adjacent to the colostomy.
There is a small amount of stool in the collapsed rectosigmoid.
There is no inguinal hernia.

There is mild intrahepatic ductal dilation. The gallbladder is
mildly distended without evidence of calcified stones nor definite
wall thickening. The spleen is not enlarged. There is no evidence of
acute pancreatitis. The common bile duct is mildly dilated to
approximately 8 mm within the pancreatic head. There is
hydronephrosis on the right which appears to be related to a
ureteropelvic junction obstruction. On the left the kidney is
atrophic. There are stones near the ureteral pelvic junction but
there is no hydronephrosis on the left. The caliber of the abdominal
aorta is normal. There is a Foley catheter in place within the
decompressed urinary bladder. The uterus is surgically absent. There
are no adnexal masses.

The lung bases are clear. There are degenerative disc changes of the
thoracic spine.
IMPRESSION: 1. Since the previous study the patient has undergone surgical
decompression of the small bowel with creation of a presumed
colostomy. Numerous loops of distended proximal and mid small bowel
with loops of normal calibered distal small bowel are present. There
is a small amount of gas within the incision site extending to the
peritoneal surface which could in the appropriate clinical setting
reflect early development of a fistula, but a discrete connection
with bowel is not demonstrated. The appearance of the small bowel
loops is consistent with high-grade obstruction of the mid to distal
small bowel. Is there output within the colostomy?
2. Inflammatory change in the suprapubic region is suspected which
was also abnormal on the previous CT scan . The possibility of a in
tear or vesical fistula was raised previously. It is not possible to
comment on this further today with the urinary bladder being
decompressed with a Foley catheter. Correlation with the appearance
of and laboratory evaluation of the urine would be useful.
3. The colon appears relatively collapsed.
4. There is persistent hydronephrosis on the right that appears to
be related to chronic ureteropelvic junction obstruction. No stone
or hydroureter is demonstrated. On the left there is mild renal
atrophy and there is a known stone in the renal pelvis not producing
obstruction currently.
5. Again demonstrated is mild intrahepatic ductal dilation. The
gallbladder is mildly distended without evidence of acute
cholecystitis or stones. There is no evidence of acute pancreatitis.
Surgical consultation is recommended.
6. These results were called by me by telephone at the time of
interpretation on 06/29/2013 at [DATE] to Dr. CHERYNE HEEBINK , who
verbally acknowledged these results.

## 2014-12-13 IMAGING — CR DG ABDOMEN 2V
1 series · 3 of 3 positions shown · non-contrast
Comparison: CT abdomen and pelvis 06/29/2013

CLINICAL DATA: Ileus

EXAM:
ABDOMEN - 2 VIEW

[Series 6: x abdomen supine · 0.14mm/px · 3 of 3 slices shown]
[im 1/3]
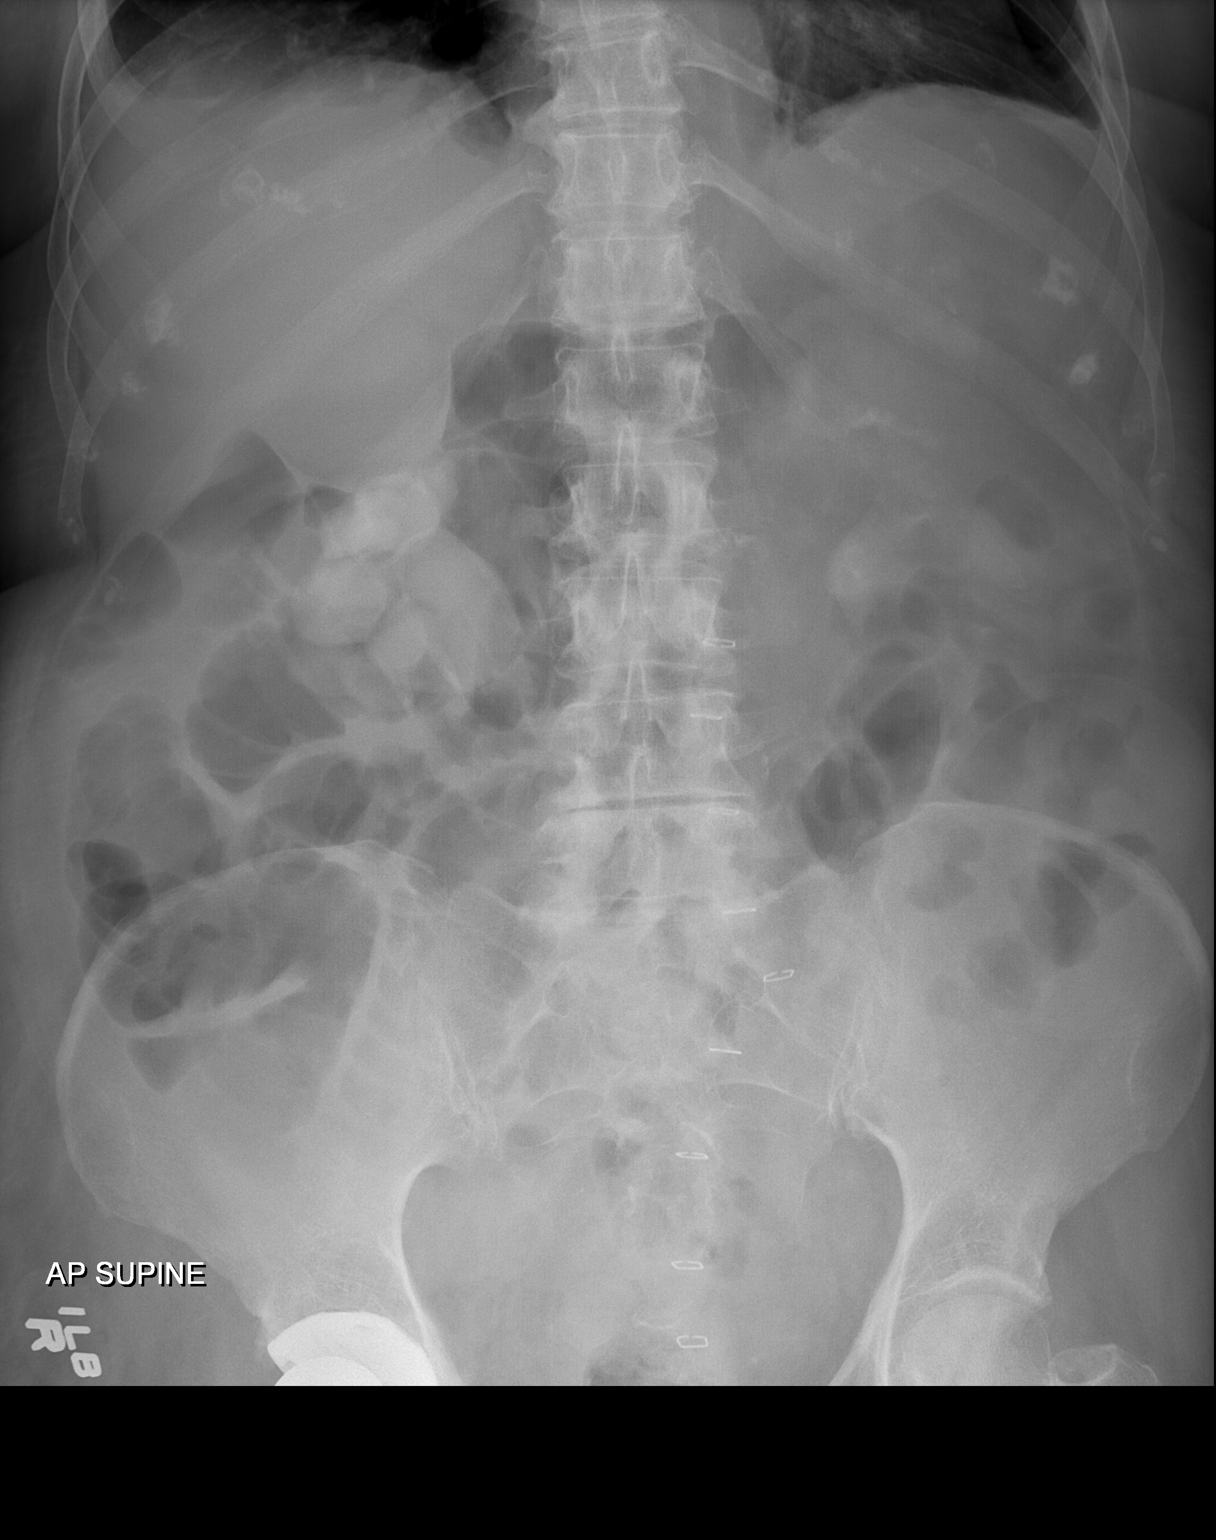
[im 2/3]
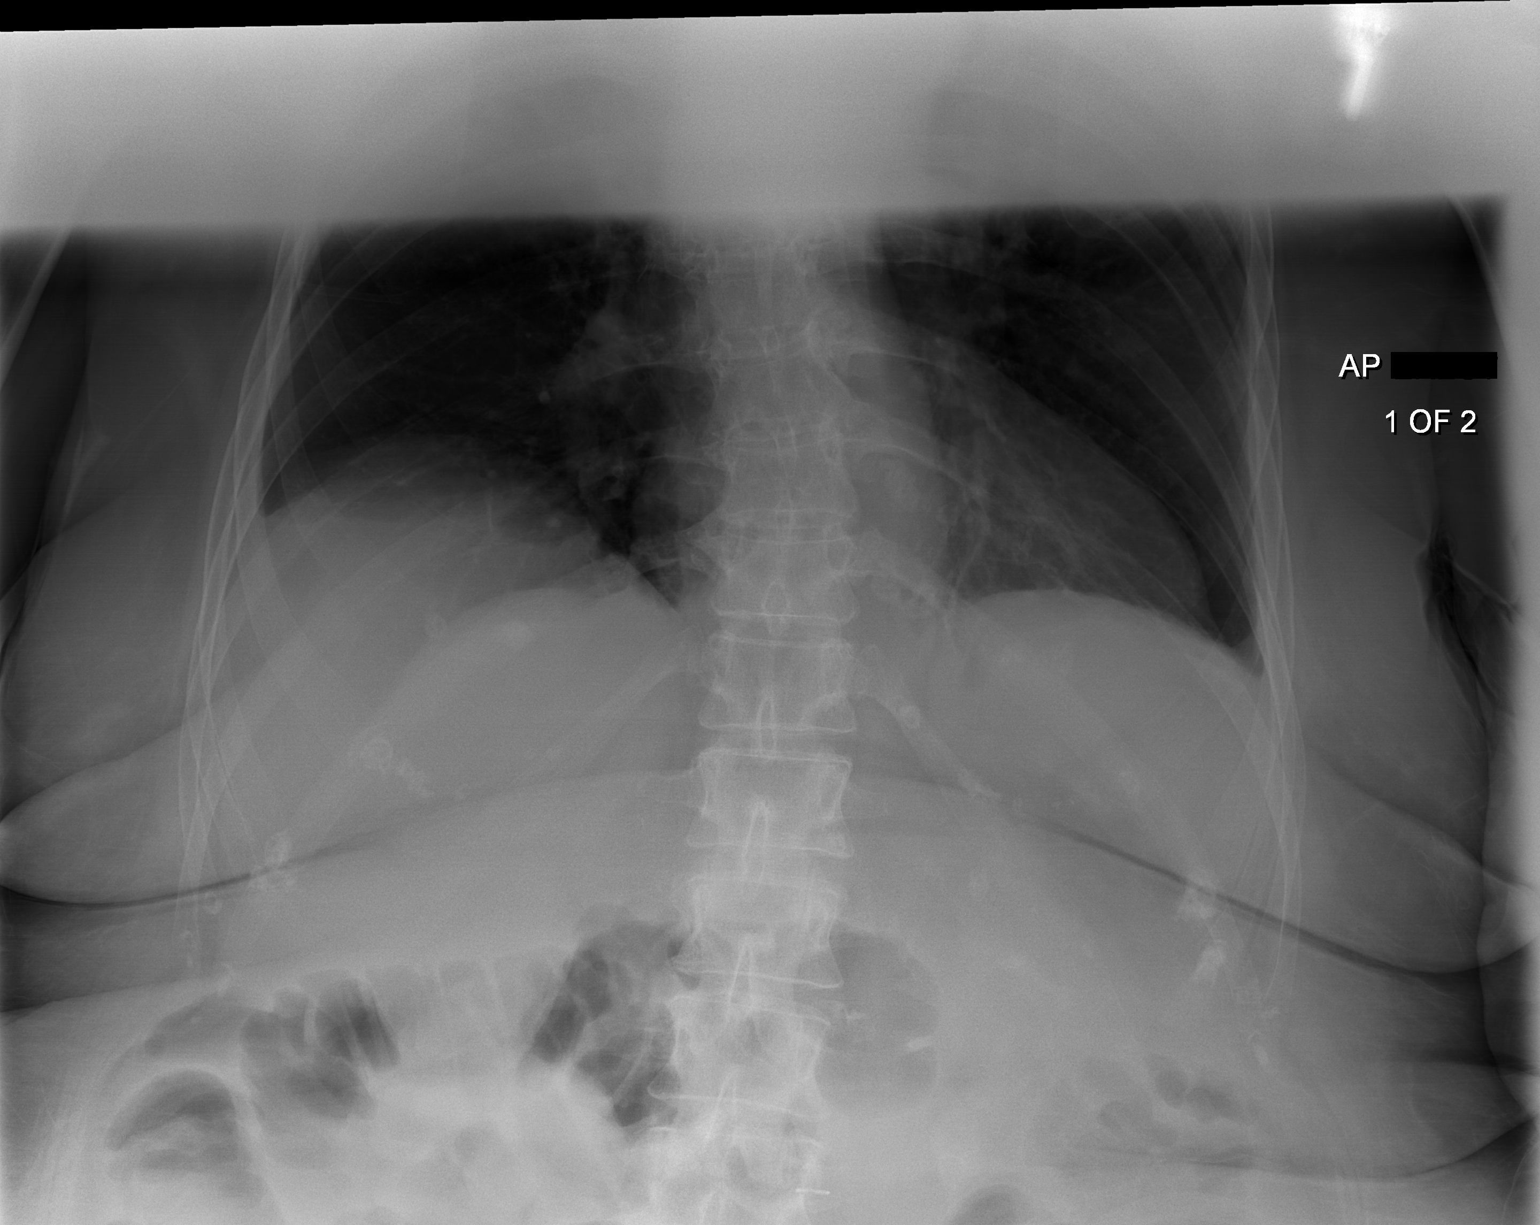
[im 3/3]
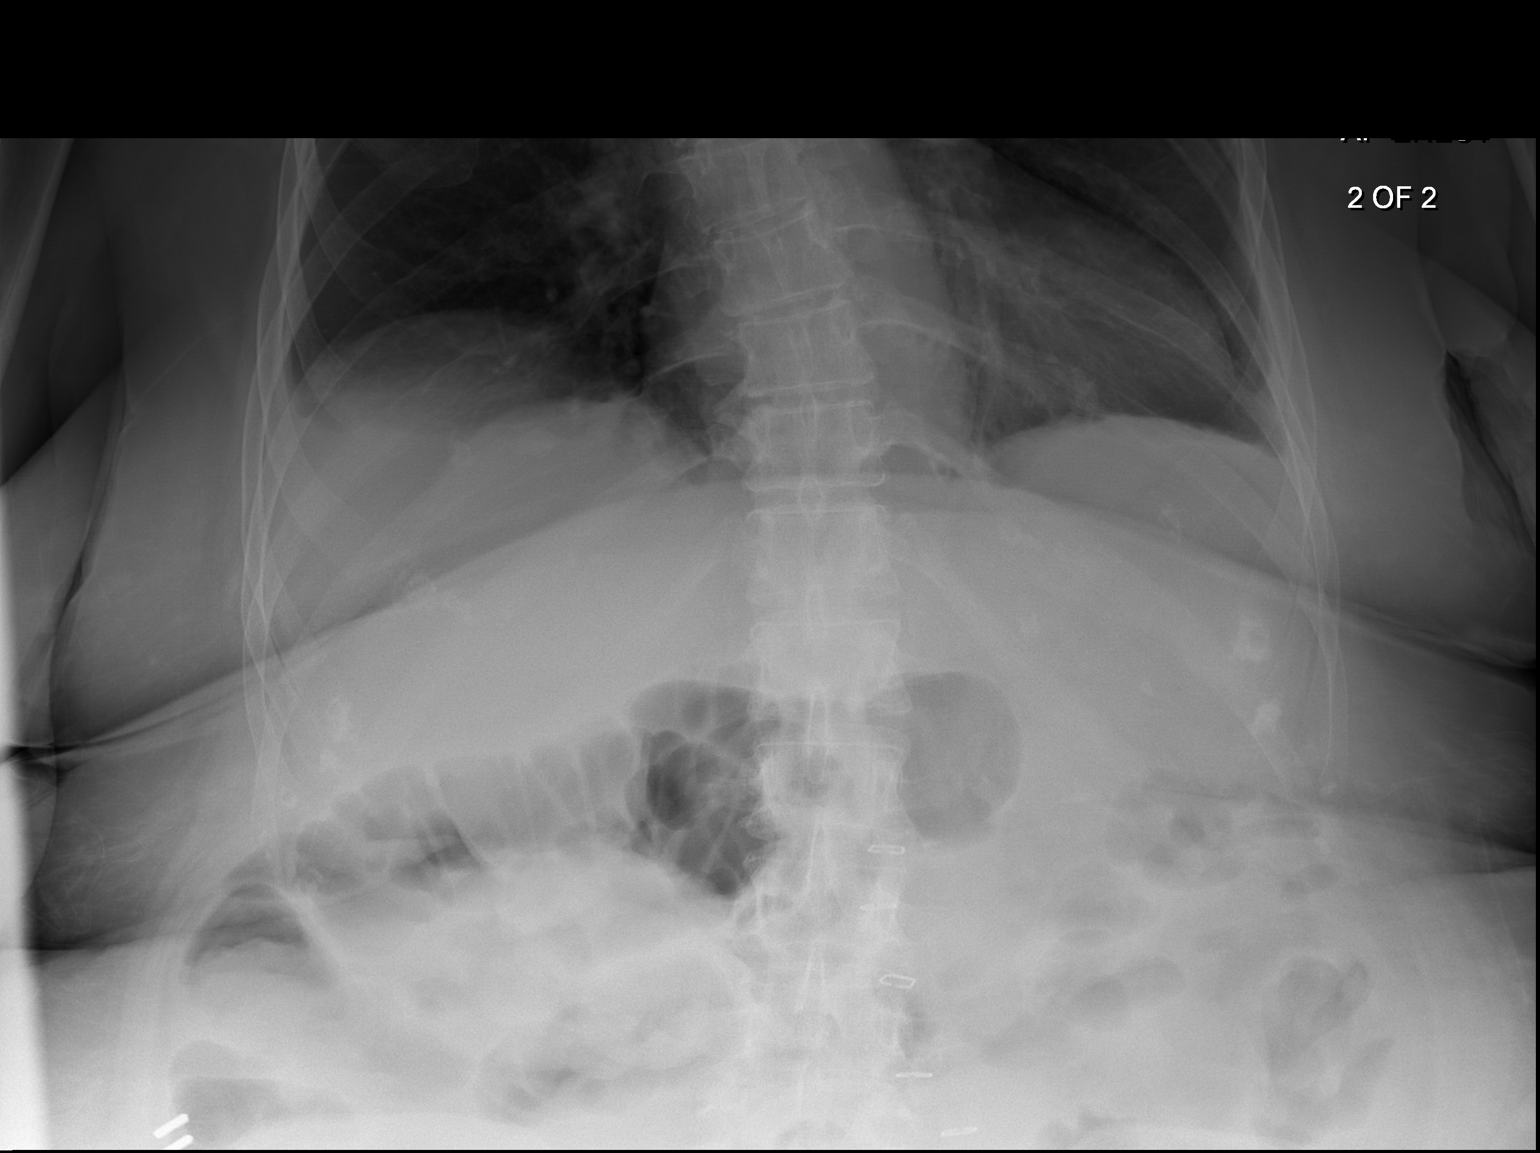

[3 of 3 positions shown; findings below may reference images not displayed]

FINDINGS: Skin clips from prior laparotomy.

Small amount excreted contrast from prior CT within a nondistended
left renal collecting system.

Retained contrast within a hydronephrotic right renal collecting
system as well.

Prominent small bowel loops in right mid abdomen with bowel wall
thickening.

No bowel dilatation or bowel wall thickening.

Bones demineralized with degenerative disc disease changes lower
lumbar spine.

Right hip prosthesis.
IMPRESSION: Prominent small bowel loops in the right mid abdomen which could
represent postoperative ileus or obstruction.

Right hydronephrosis.

## 2015-02-09 ENCOUNTER — Other Ambulatory Visit: Payer: Self-pay | Admitting: Family Medicine

## 2015-02-09 DIAGNOSIS — G8929 Other chronic pain: Secondary | ICD-10-CM

## 2015-02-09 DIAGNOSIS — M549 Dorsalgia, unspecified: Principal | ICD-10-CM

## 2015-02-22 DIAGNOSIS — H01019 Ulcerative blepharitis unspecified eye, unspecified eyelid: Secondary | ICD-10-CM | POA: Diagnosis not present

## 2015-03-10 DIAGNOSIS — H01019 Ulcerative blepharitis unspecified eye, unspecified eyelid: Secondary | ICD-10-CM | POA: Diagnosis not present

## 2015-03-31 DIAGNOSIS — H401221 Low-tension glaucoma, left eye, mild stage: Secondary | ICD-10-CM | POA: Diagnosis not present

## 2015-04-06 ENCOUNTER — Other Ambulatory Visit: Payer: Self-pay | Admitting: Family Medicine

## 2015-04-06 NOTE — Telephone Encounter (Signed)
Pt is requesting more Colostomy supplies sent to [facility]. Pt stated that she requested the supplies from [facility] on 03/28/15 and was told they contacted our office on 03/29/15 but that they haven't heard back from our office. Pt stated she was upset and that this shouldn't happen b/c she is about to run out of supplies. I don't see where [facility] tried to contact our office. Thanks TNP

## 2015-04-06 NOTE — Telephone Encounter (Signed)
Please call [facility] and clarify. Last I signed was in July. Thanks.

## 2015-04-06 NOTE — Telephone Encounter (Signed)
Forms have been refaxed today.    Thanks,   -Mickel Baas

## 2015-05-17 DIAGNOSIS — Z23 Encounter for immunization: Secondary | ICD-10-CM | POA: Diagnosis not present

## 2015-05-18 DIAGNOSIS — H01019 Ulcerative blepharitis unspecified eye, unspecified eyelid: Secondary | ICD-10-CM | POA: Diagnosis not present

## 2015-05-27 DIAGNOSIS — H01019 Ulcerative blepharitis unspecified eye, unspecified eyelid: Secondary | ICD-10-CM | POA: Diagnosis not present

## 2015-06-08 ENCOUNTER — Encounter: Payer: Self-pay | Admitting: Family Medicine

## 2015-06-08 ENCOUNTER — Ambulatory Visit (INDEPENDENT_AMBULATORY_CARE_PROVIDER_SITE_OTHER): Payer: Medicare Other | Admitting: Family Medicine

## 2015-06-08 VITALS — BP 110/66 | HR 73 | Temp 98.7°F | Resp 16 | Ht 64.0 in | Wt 183.0 lb

## 2015-06-08 DIAGNOSIS — Z433 Encounter for attention to colostomy: Secondary | ICD-10-CM | POA: Insufficient documentation

## 2015-06-08 DIAGNOSIS — D692 Other nonthrombocytopenic purpura: Secondary | ICD-10-CM

## 2015-06-08 DIAGNOSIS — I10 Essential (primary) hypertension: Secondary | ICD-10-CM | POA: Insufficient documentation

## 2015-06-08 DIAGNOSIS — M545 Low back pain, unspecified: Secondary | ICD-10-CM | POA: Insufficient documentation

## 2015-06-08 DIAGNOSIS — N189 Chronic kidney disease, unspecified: Secondary | ICD-10-CM | POA: Insufficient documentation

## 2015-06-08 DIAGNOSIS — Z23 Encounter for immunization: Secondary | ICD-10-CM

## 2015-06-08 DIAGNOSIS — N200 Calculus of kidney: Secondary | ICD-10-CM | POA: Insufficient documentation

## 2015-06-08 DIAGNOSIS — Z780 Asymptomatic menopausal state: Secondary | ICD-10-CM | POA: Diagnosis not present

## 2015-06-08 DIAGNOSIS — R599 Enlarged lymph nodes, unspecified: Secondary | ICD-10-CM | POA: Insufficient documentation

## 2015-06-08 DIAGNOSIS — G4700 Insomnia, unspecified: Secondary | ICD-10-CM | POA: Insufficient documentation

## 2015-06-08 DIAGNOSIS — F32A Depression, unspecified: Secondary | ICD-10-CM | POA: Insufficient documentation

## 2015-06-08 DIAGNOSIS — Z0000 Encounter for general adult medical examination without abnormal findings: Secondary | ICD-10-CM | POA: Diagnosis not present

## 2015-06-08 DIAGNOSIS — G723 Periodic paralysis: Secondary | ICD-10-CM | POA: Insufficient documentation

## 2015-06-08 DIAGNOSIS — F329 Major depressive disorder, single episode, unspecified: Secondary | ICD-10-CM | POA: Insufficient documentation

## 2015-06-08 DIAGNOSIS — R591 Generalized enlarged lymph nodes: Secondary | ICD-10-CM | POA: Insufficient documentation

## 2015-06-08 DIAGNOSIS — R739 Hyperglycemia, unspecified: Secondary | ICD-10-CM | POA: Insufficient documentation

## 2015-06-08 DIAGNOSIS — R238 Other skin changes: Secondary | ICD-10-CM | POA: Insufficient documentation

## 2015-06-08 NOTE — Progress Notes (Signed)
Patient ID: Tina [name], female   DOB: Dec 23, 1932, 81 y.o.   MRN: [id number]        Patient: Tina [name], Female    DOB: January 28, 1933, 81 y.o.   MRN: [id number] Visit Date: 06/08/2015  Today's Provider: Margarita Rana, MD   Chief Complaint  Patient presents with  . Medicare Wellness   Subjective:    Annual wellness visit Tina [name] is a 81 y.o. female. She feels well. She reports exercising house cleaning. She reports she is sleeping fairly well. 05/11/11 CPE 10/09/11 Mammo-BI-RADS 1 08/19/03 Colon-Diverticulosis  Lab Results  Component Value Date   WBC 9.5 12/09/2014   HGB 12.8 12/09/2014   HCT 40 12/09/2014   PLT 301 12/09/2014   GLUCOSE 102* 12/02/2013   TRIG 211* 08/10/2013   ALT 12 12/09/2014   AST 16 12/09/2014   NA 142 12/09/2014   K 5.1 12/09/2014   CL 103 12/02/2013   CREATININE 1.1 12/09/2014   BUN 30* 12/09/2014   CO2 30 12/02/2013   INR 1.0 12/01/2013   ----------------------------------------------------------- Review of Systems  Constitutional: Negative.   HENT: Positive for hearing loss.   Eyes: Positive for pain, discharge and visual disturbance.  Respiratory: Negative.   Cardiovascular: Negative.   Gastrointestinal: Negative.   Endocrine: Negative.   Genitourinary: Negative.   Musculoskeletal: Positive for back pain.  Skin: Negative.   Allergic/Immunologic: Negative.   Neurological: Negative.   Hematological: Negative.   Psychiatric/Behavioral: Negative.     Social History   Social History  . Marital Status: Single    Spouse Name: N/A  . Number of Children: N/A  . Years of Education: N/A   Occupational History  . Not on file.   Social History Main Topics  . Smoking status: Former Smoker    Quit date: 07/30/1979  . Smokeless tobacco: Never Used  . Alcohol Use: Yes  . Drug Use: No  . Sexual Activity: Not on file   Other Topics Concern  . Not on file   Social History Narrative    Patient Active  Problem List   Diagnosis Date Noted  . Abnormal kidney function study 06/08/2015  . Encounter for attention to colostomy ([place]) 06/08/2015  . Clinical depression 06/08/2015  . Calcium blood increased 06/08/2015  . Blood glucose elevated 06/08/2015  . BP (high blood pressure) 06/08/2015  . Cannot sleep 06/08/2015  . Calculus of kidney 06/08/2015  . LBP (low back pain) 06/08/2015  . Adenopathy 06/08/2015  . Venous [place] 06/08/2015  . Adynamia 06/08/2015  . Polymyalgia rheumatica ([place]) 05/27/2013  . Coxitis 03/11/2013  . Difficulty hearing 07/08/2009  . Accumulation of fluid in tissues 02/14/2009  . Menopausal and perimenopausal disorder 02/14/2009  . Excessive urination at night 02/14/2009  . 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 Colon, diverticulosis 08/19/2003  . Diaphragmatic hernia 08/19/2003  . Female genuine stress incontinence 08/19/2002  . Arthritis, degenerative 01/20/2001  . HLD (hyperlipidemia) 02/21/1998  . Adiposity 12/21/1994    Past Surgical History  Procedure Laterality Date  . Knee surgery Left 2004    total knee replacement   . Abdominal hysterectomy  1980's  . Dilation and curettage of uterus  1970's  . Hip surgery Right 06/01/2013  . Colon surgery  06/08/13    s/p ruptured diverticulitis, has fistula; had TPN for 3 mths    Her family history includes Cancer in her mother.    Previous Medications   HYDROCHLOROTHIAZIDE (HYDRODIURIL) 25 MG TABLET    Take 25  mg by mouth daily.    LISINOPRIL (PRINIVIL,ZESTRIL) 20 MG TABLET    Take 1 tablet by mouth daily.   LORATADINE (CLARITIN) 10 MG TABLET    Take 10 mg by mouth daily as needed.    METOPROLOL (LOPRESSOR) 50 MG TABLET    Take 1 tablet by mouth 2 (two) times daily.   PREDNISONE (DELTASONE) 5 MG TABLET    TAKE 1 TABLET DAILY   PROBIOTIC PRODUCT (NATRUL PROBIOTIC) CAPS    Take 1 capsule by mouth daily.   TOBRAMYCIN-DEXAMETHASONE [facility]) OPHTHALMIC SOLUTION        Patient Care Team: Margarita Rana, MD as PCP - General (Family Medicine)     Objective:   Vitals: BP 110/66 mmHg  Pulse 73  Temp(Src) 98.7 F (37.1 C) (Oral)  Resp 16  Ht 5\' 4"  (1.626 m)  Wt 183 lb (83.008 kg)  BMI 31.40 kg/m2  SpO2 96%  Physical Exam  Constitutional: She is oriented to person, place, and time. She appears well-developed and well-nourished.  HENT:  Head: Normocephalic and atraumatic.  Right Ear: Tympanic membrane, external ear and ear canal normal.  Left Ear: Tympanic membrane, external ear and ear canal normal.  Nose: Nose normal.  Mouth/Throat: Uvula is midline, oropharynx is clear and moist and mucous membranes are normal.  Eyes: Conjunctivae, EOM and lids are normal. Pupils are equal, round, and reactive to light.  Neck: Trachea normal and normal range of motion. Neck supple. Carotid bruit is not present. No thyroid mass and no thyromegaly present.  Cardiovascular: Normal rate, regular rhythm and normal heart sounds.   Pulmonary/Chest: Effort normal and breath sounds normal.  Abdominal: Soft. Normal appearance and bowel sounds are normal. There is no hepatosplenomegaly. There is no tenderness.  Musculoskeletal: Normal range of motion.  Lymphadenopathy:    She has no cervical adenopathy.    She has no axillary adenopathy.  Neurological: She is alert and oriented to person, place, and time. She has normal strength. No cranial nerve deficit.  Skin: Skin is warm, dry and intact.  Psychiatric: She has a normal mood and affect. Her speech is normal and behavior is normal. Judgment and thought content normal. Cognition and memory are normal.    Activities of Daily Living In your present state of health, do you have any difficulty performing the following activities: 06/08/2015  Hearing? Y  Vision? Y  Difficulty concentrating or making decisions? N  Walking or climbing stairs? Y  Dressing or bathing? N  Doing errands, shopping? N    Fall Risk Assessment Fall Risk  06/08/2015  Falls  in the past year? No     Depression Screen PHQ 2/9 Scores 06/08/2015  PHQ - 2 Score 0    Cognitive Testing - 6-CIT  Correct? Score   What year is it? yes 0 0 or 4  What month is it? no 0 0 or 3  Memorize:    Tina [name],  44,  [address],  [place],      What time is it? (within 1 hour) yes 0 0 or 3  Count backwards from 20 yes 0 0, 2, or 4  Name the months of the year yes 0 0, 2, or 4  Repeat name & address above yes 0 0, 2, 4, 6, 8, or 10       TOTAL SCORE  0/28   Interpretation:  Normal  Normal (0-7) Abnormal (8-28)       Assessment & Plan:  Annual Wellness Visit  Reviewed patient's Family Medical History Reviewed and updated list of patient's medical providers Assessment of cognitive impairment was done Assessed patient's functional ability Established a written schedule for health screening [place] Completed and Reviewed  Exercise Activities and Dietary recommendations Goals    . Exercise 150 minutes per week (moderate activity)       Immunization History  Administered Date(s) Administered  . Pneumococcal Conjugate-13 06/08/2015  . Td 02/09/2008   ------------------------------------------------------------------------------------------------------------    1. Medicare annual wellness visit, subsequent Stable. Patient advised to continue eating healthy and exercise daily.  2. Need for pneumococcal vaccination - Pneumococcal conjugate vaccine 13-valent IM  3. Senile purpura ([place])  4. Postmenopausal - DG Bone Density; Future     Patient seen and examined by Dr. Jerrell Belfast, and note scribed by Philbert Riser. Adja Ruff, CMA.

## 2015-06-16 DIAGNOSIS — E2839 Other primary ovarian failure: Secondary | ICD-10-CM | POA: Diagnosis not present

## 2015-07-29 ENCOUNTER — Encounter: Payer: Self-pay | Admitting: Family Medicine

## 2015-08-05 DIAGNOSIS — H01019 Ulcerative blepharitis unspecified eye, unspecified eyelid: Secondary | ICD-10-CM | POA: Diagnosis not present

## 2015-08-12 DIAGNOSIS — H2513 Age-related nuclear cataract, bilateral: Secondary | ICD-10-CM | POA: Diagnosis not present

## 2015-09-22 ENCOUNTER — Telehealth: Payer: Self-pay | Admitting: Family Medicine

## 2015-09-22 NOTE — Telephone Encounter (Signed)
Tried calling pt, line was busy. Will try again later. Renaldo Fiddler, CMA

## 2015-09-22 NOTE — Telephone Encounter (Signed)
We can fill one out.  Please bring one for me to fill out and then we can call patient. Thanks.

## 2015-09-22 NOTE — Telephone Encounter (Signed)
Pt needs a renewal for her handicap sticker for her car.  Does she need to come in or can she just pick one up.  Her call back is [phone]  Thank sTeri

## 2015-09-22 NOTE — Telephone Encounter (Signed)
Filled out form. Please call and check which indication she needs it for. Did not document need at [facility]. Thanks.

## 2015-09-22 NOTE — Telephone Encounter (Signed)
On your desk. Tina [name], CMA

## 2015-09-27 NOTE — Telephone Encounter (Signed)
Pt needs the Handicapped placard for:  -Cannot walk without use of , or assistance from, a cane. -Is severely limited in their ability to walk due to arthritic, neurological, or orthopedic condition.  Thanks,   -Mickel Baas

## 2015-09-30 NOTE — Telephone Encounter (Signed)
Pt called to see if DMV was completed. Per Mickel Baas it is ready and I advised pt. Thanks TNP

## 2015-10-09 ENCOUNTER — Emergency Department: Payer: Medicare Other

## 2015-10-09 ENCOUNTER — Encounter: Payer: Self-pay | Admitting: *Deleted

## 2015-10-09 ENCOUNTER — Emergency Department
Admission: EM | Admit: 2015-10-09 | Discharge: 2015-10-09 | Disposition: A | Discharge status: Home / Self Care | Payer: Medicare Other | Attending: Emergency Medicine | Admitting: Emergency Medicine

## 2015-10-09 DIAGNOSIS — Z87891 Personal history of nicotine dependence: Secondary | ICD-10-CM | POA: Diagnosis not present

## 2015-10-09 DIAGNOSIS — R531 Weakness: Secondary | ICD-10-CM | POA: Diagnosis not present

## 2015-10-09 DIAGNOSIS — J069 Acute upper respiratory infection, unspecified: Secondary | ICD-10-CM | POA: Diagnosis not present

## 2015-10-09 DIAGNOSIS — R509 Fever, unspecified: Secondary | ICD-10-CM | POA: Diagnosis not present

## 2015-10-09 DIAGNOSIS — R05 Cough: Secondary | ICD-10-CM | POA: Diagnosis not present

## 2015-10-09 DIAGNOSIS — M6281 Muscle weakness (generalized): Secondary | ICD-10-CM | POA: Diagnosis not present

## 2015-10-09 LAB — COMPREHENSIVE METABOLIC PANEL
ALBUMIN: 3.9 g/dL (ref 3.5–5.0)
ALT: 15 U/L (ref 14–54)
AST: 25 U/L (ref 15–41)
Alkaline Phosphatase: 65 U/L (ref 38–126)
Anion gap: 7 (ref 5–15)
BUN: 30 mg/dL — AB (ref 6–20)
CHLORIDE: 99 mmol/L — AB (ref 101–111)
CO2: 32 mmol/L (ref 22–32)
Calcium: 8.8 mg/dL — ABNORMAL LOW (ref 8.9–10.3)
Creatinine, Ser: 1.08 mg/dL — ABNORMAL HIGH (ref 0.44–1.00)
GFR calc Af Amer: 55 mL/min — ABNORMAL LOW (ref >60)
GFR calc non Af Amer: 47 mL/min — ABNORMAL LOW (ref >60)
GLUCOSE: 136 mg/dL — AB (ref 65–99)
POTASSIUM: 3.8 mmol/L (ref 3.5–5.1)
Sodium: 138 mmol/L (ref 135–145)
Total Bilirubin: 0.6 mg/dL (ref 0.3–1.2)
Total Protein: 7.6 g/dL (ref 6.5–8.1)

## 2015-10-09 LAB — CBC WITH DIFFERENTIAL/PLATELET
BASOS ABS: 0 10*3/uL (ref 0–0.1)
BASOS PCT: 1 %
EOS PCT: 1 %
Eosinophils Absolute: 0 10*3/uL (ref 0–0.7)
HCT: 40.2 % (ref 35.0–47.0)
Hemoglobin: 13.3 g/dL (ref 12.0–16.0)
Lymphocytes Relative: 6 %
Lymphs Abs: 0.4 10*3/uL — ABNORMAL LOW (ref 1.0–3.6)
MCH: 32 pg (ref 26.0–34.0)
MCHC: 33.2 g/dL (ref 32.0–36.0)
MCV: 96.3 fL (ref 80.0–100.0)
MONO ABS: 0.6 10*3/uL (ref 0.2–0.9)
Monocytes Relative: 8 %
NEUTROS ABS: 6.2 10*3/uL (ref 1.4–6.5)
Neutrophils Relative %: 84 %
PLATELETS: 211 10*3/uL (ref 150–440)
RBC: 4.17 MIL/uL (ref 3.80–5.20)
RDW: 13.5 % (ref 11.5–14.5)
WBC: 7.3 10*3/uL (ref 3.6–11.0)

## 2015-10-09 LAB — RAPID INFLUENZA A&B ANTIGENS: Influenza A (ARMC): NEGATIVE

## 2015-10-09 LAB — RAPID INFLUENZA A&B ANTIGENS (ARMC ONLY): INFLUENZA B (ARMC): NEGATIVE

## 2015-10-09 LAB — TROPONIN I: Troponin I: <0.03 ng/mL (ref <0.031)

## 2015-10-09 LAB — LACTIC ACID, PLASMA: Lactic Acid, Venous: 1.3 mmol/L (ref 0.5–2.0)

## 2015-10-09 MED ORDER — PREDNISONE 10 MG (21) PO TBPK: 10.0000 mg | ORAL_TABLET | Freq: Every day | ORAL | Status: DC

## 2015-10-09 MED ORDER — SODIUM CHLORIDE 0.9 % IV SOLN
Freq: Once | INTRAVENOUS | Status: AC
  Administered 2015-10-09: 21:00:00 via INTRAVENOUS

## 2015-10-09 MED ORDER — LEVOFLOXACIN 750 MG PO TABS
750.0000 mg | ORAL_TABLET | Freq: Once | ORAL | Status: AC
  Administered 2015-10-09: 750 mg via ORAL
  Filled 2015-10-09: qty 1

## 2015-10-09 MED ORDER — LEVOFLOXACIN 750 MG PO TABS: 750.0000 mg | ORAL_TABLET | Freq: Every day | ORAL | Status: DC

## 2015-10-09 MED ORDER — ALBUTEROL SULFATE HFA 108 (90 BASE) MCG/ACT IN AERS: 2.0000 | INHALATION_SPRAY | Freq: Four times a day (QID) | RESPIRATORY_TRACT | Status: DC | PRN

## 2015-10-09 MED ORDER — HYDROCOD POLST-CPM POLST ER 10-8 MG/5ML PO SUER: 5.0000 mL | Freq: Two times a day (BID) | ORAL | Status: DC

## 2015-10-09 MED ORDER — IPRATROPIUM-ALBUTEROL 0.5-2.5 (3) MG/3ML IN SOLN
3.0000 mL | Freq: Once | RESPIRATORY_TRACT | Status: AC
  Administered 2015-10-09: 3 mL via RESPIRATORY_TRACT
  Filled 2015-10-09: qty 3

## 2015-10-09 NOTE — Discharge Instructions (Signed)
Upper Respiratory Infection, Adult Most upper respiratory infections (URIs) are a viral infection of the air passages leading to the lungs. A URI affects the nose, throat, and upper air passages. The most common type of URI is nasopharyngitis and is typically referred to as "the common cold." URIs run their course and usually go away on their own. Most of the time, a URI does not require medical attention, but sometimes a bacterial infection in the upper airways can follow a viral infection. This is called a secondary infection. Sinus and middle ear infections are common types of secondary upper respiratory infections. Bacterial pneumonia can also complicate a URI. A URI can worsen asthma and chronic obstructive pulmonary disease (COPD). Sometimes, these complications can require emergency medical care and may be life threatening.  CAUSES Almost all URIs are caused by viruses. A virus is a type of germ and can spread from one person to another.  RISKS FACTORS You may be at risk for a URI if:   You smoke.   You have chronic heart or lung disease.  You have a weakened defense (immune) system.   You are very young or very old.   You have nasal allergies or asthma.  You work in crowded or poorly ventilated areas.  You work in health care facilities or schools. SIGNS AND SYMPTOMS  Symptoms typically develop 2-3 days after you come in contact with a cold virus. Most viral URIs last 7-10 days. However, viral URIs from the influenza virus (flu virus) can last 14-18 days and are typically more severe. Symptoms may include:   Runny or stuffy (congested) nose.   Sneezing.   Cough.   Sore throat.   Headache.   Fatigue.   Fever.   Loss of appetite.   Pain in your forehead, behind your eyes, and over your cheekbones (sinus pain).  Muscle aches.  DIAGNOSIS  Your health care provider may diagnose a URI by:  Physical exam.  Tests to check that your symptoms are not due to  another condition such as:  Strep throat.  Sinusitis.  Pneumonia.  Asthma. TREATMENT  A URI goes away on its own with time. It cannot be cured with medicines, but medicines may be prescribed or recommended to relieve symptoms. Medicines may help:  Reduce your fever.  Reduce your cough.  Relieve nasal congestion. HOME CARE INSTRUCTIONS   Take medicines only as directed by your health care provider.   Gargle warm saltwater or take cough drops to comfort your throat as directed by your health care provider.  Use a warm mist humidifier or inhale steam from a shower to increase air moisture. This may make it easier to breathe.  Drink enough fluid to keep your urine clear or pale yellow.   Eat soups and other clear broths and maintain good nutrition.   Rest as needed.   Return to work when your temperature has returned to normal or as your health care provider advises. You may need to stay home longer to avoid infecting others. You can also use a face mask and careful hand washing to prevent spread of the virus.  Increase the usage of your inhaler if you have asthma.   Do not use any tobacco products, including cigarettes, chewing tobacco, or electronic cigarettes. If you need help quitting, ask your health care provider. PREVENTION  The best way to protect yourself from getting a cold is to practice good hygiene.   Avoid oral or hand contact with people with cold   symptoms.   Wash your hands often if contact occurs.  There is no clear evidence that vitamin C, vitamin E, echinacea, or exercise reduces the chance of developing a cold. However, it is always recommended to get plenty of rest, exercise, and practice good nutrition.  SEEK MEDICAL CARE IF:   You are getting worse rather than better.   Your symptoms are not controlled by medicine.   You have chills.  You have worsening shortness of breath.  You have brown or red mucus.  You have yellow or brown nasal  discharge.  You have pain in your face, especially when you bend forward.  You have a fever.  You have swollen neck glands.  You have pain while swallowing.  You have white areas in the back of your throat. SEEK IMMEDIATE MEDICAL CARE IF:   You have severe or persistent:  Headache.  Ear pain.  Sinus pain.  Chest pain.  You have chronic lung disease and any of the following:  Wheezing.  Prolonged cough.  Coughing up blood.  A change in your usual mucus.  You have a stiff neck.  You have changes in your:  Vision.  Hearing.  Thinking.  Mood. MAKE SURE YOU:   Understand these instructions.  Will watch your condition.  Will get help right away if you are not doing well or get worse.   This information is not intended to replace advice given to you by your health care provider. Make sure you discuss any questions you have with your health care provider.   Document Released: 01/09/2001 Document Revised: 11/30/2014 Document Reviewed: 10/21/2013 Elsevier Interactive Patient Education 2016 Elsevier Inc.  

## 2015-10-09 NOTE — ED Provider Notes (Signed)
[facility] Emergency Department Provider Note     Time seen: ----------------------------------------- 7:46 PM on 10/09/2015 -----------------------------------------    I have reviewed the triage vital signs and the nursing notes.   HISTORY  Chief Complaint Weakness; Cough; and Nasal Congestion    HPI Tina [name] is a 82 y.o. female brought to the ER from home for weakness, cough, low fever and nasal congestion. Symptoms have been present for 5 days, nothing makes it better or worse. She's had persistent cough and has not been able to expectorate anything. She denies a history of this before, denies shaking chills.   No past medical history on file.  Patient Active Problem List   Diagnosis Date Noted  . Abnormal kidney function study 06/08/2015  . Encounter for attention to colostomy ([place]) 06/08/2015  . Clinical depression 06/08/2015  . Calcium blood increased 06/08/2015  . Blood glucose elevated 06/08/2015  . BP (high blood pressure) 06/08/2015  . Cannot sleep 06/08/2015  . Calculus of kidney 06/08/2015  . LBP (low back pain) 06/08/2015  . Adenopathy 06/08/2015  . Venous lake 06/08/2015  . Adynamia 06/08/2015  . Polymyalgia rheumatica ([place]) 05/27/2013  . Coxitis 03/11/2013  . Difficulty hearing 07/08/2009  . Accumulation of fluid in tissues 02/14/2009  . Menopausal and perimenopausal disorder 02/14/2009  . Excessive urination at night 02/14/2009  . Anarthritic rheumatoid disease ([place]) 02/14/2009  . Colon, diverticulosis 08/19/2003  . Diaphragmatic hernia 08/19/2003  . Female genuine stress incontinence 08/19/2002  . Arthritis, degenerative 01/20/2001  . HLD (hyperlipidemia) 02/21/1998  . Adiposity 12/21/1994    Past Surgical History  Procedure Laterality Date  . Knee surgery Left 2004    total knee replacement   . Abdominal hysterectomy  1980's  . Dilation and curettage of uterus  1970's  . Hip surgery Right 06/01/2013  .  Colon surgery  06/08/13    s/p ruptured diverticulitis, has fistula; had TPN for 3 mths    Allergies Erythromycin  Social History Social History  Substance Use Topics  . Smoking status: Former Smoker    Quit date: 07/30/1979  . Smokeless tobacco: Never Used  . Alcohol Use: Yes    Review of Systems Constitutional: Positive for fever Eyes: Negative for visual changes. ENT: Positive for nasal congestion Cardiovascular: Negative for chest pain. Respiratory: Positive for cough Gastrointestinal: Negative for abdominal pain, vomiting and diarrhea. Genitourinary: Negative for dysuria. Musculoskeletal: Negative for back pain. Skin: Negative for rash. Neurological: Negative for headaches, positive for generalized weakness  10-point ROS otherwise negative.  ____________________________________________   PHYSICAL EXAM:  VITAL SIGNS: ED Triage Vitals  Enc Vitals Group     BP --      Pulse --      Resp --      Temp --      Temp src --      SpO2 --      Weight --      Height --      Head Cir --      Peak Flow --      Pain Score --      Pain Loc --      Pain Edu? --      Excl. in [place]? --     Constitutional: Alert and oriented. Weak appearing, no acute distress Eyes: Conjunctivae are normal. PERRL. Normal extraocular movements. ENT   Head: Normocephalic and atraumatic.   Nose: No congestion/rhinnorhea.   Mouth/Throat: Mucous membranes are moist.   Neck: No stridor.  Cardiovascular: Normal rate, regular rhythm. Normal and symmetric distal pulses are present in all extremities. No murmurs, rubs, or gallops. Respiratory: Normal respiratory effort without tachypnea nor retractions. Rhonchi more significantly on the right Gastrointestinal: Soft and nontender. No distention. No abdominal bruits.  Musculoskeletal: Nontender with normal range of motion in all extremities. No joint effusions.  No lower extremity tenderness nor edema. Neurologic:  Normal speech and  language. No gross focal neurologic deficits are appreciated.  Skin:  Skin is warm, dry and intact. No rash noted. Psychiatric: Mood and affect are normal.  ____________________________________________  EKG: Interpreted by me. Normal sinus rhythm with a rate of 95 bpm, normal urine, normal QRS, normal interval. Normal axis.  ____________________________________________  ED COURSE:  Pertinent labs & imaging results that were available during my care of the patient were reviewed by me and considered in my medical decision making (see chart for details). Patient with cough and possible fever. We'll assess for influenza, sepsis, pneumonia ____________________________________________    LABS (pertinent positives/negatives)  Labs Reviewed  CBC WITH DIFFERENTIAL/PLATELET - Abnormal; Notable for the following:    Lymphs Abs 0.4 (*)    All other components within normal limits  COMPREHENSIVE METABOLIC PANEL - Abnormal; Notable for the following:    Chloride 99 (*)    Glucose, Bld 136 (*)    BUN 30 (*)    Creatinine, Ser 1.08 (*)    Calcium 8.8 (*)    GFR calc non Af Amer 47 (*)    GFR calc Af Amer 55 (*)    All other components within normal limits  RAPID INFLUENZA A&B ANTIGENS ([facility] ONLY)  CULTURE, BLOOD (ROUTINE X 2)  CULTURE, BLOOD (ROUTINE X 2)  TROPONIN I  LACTIC ACID, PLASMA  URINALYSIS COMPLETEWITH MICROSCOPIC ([facility] ONLY)  LACTIC ACID, PLASMA    RADIOLOGY  Chest x-ray  IMPRESSION: Stable. No acute cardiopulmonary findings. ____________________________________________  FINAL ASSESSMENT AND PLAN  Cough, fever  Plan: Patient with labs and imaging as dictated above. Patient appeared a little dehydrated otherwise her labs are unremarkable. She was given a liter of saline, she likely has a URI or bronchitis. She will be placed on cough medicine and inhalers. She is stable for outpatient follow-up.   Earleen Newport, MD   Earleen Newport, MD 10/09/15  2224

## 2015-10-09 NOTE — ED Notes (Signed)
MD Williams at bedside.  

## 2015-10-09 NOTE — ED Notes (Signed)
Patient transported to X-ray at this time 

## 2015-10-09 NOTE — ED Notes (Signed)
Pt to ED from home via EMS with generalized weakness and flu like s/s x 3 days. Pt states cough, congestion and on and off chills since. Upon arrival, pt AAOx4, afebrile. NAD noted.

## 2015-10-10 ENCOUNTER — Telehealth: Payer: Self-pay

## 2015-10-10 NOTE — ED Provider Notes (Addendum)
Notified by a pharmacist that the patient had a positive blood culture that was identified by biofire assay as staph aureus. I reviewed the chart and found that the patient was seen yesterday for vague symptoms of cough congestion and fatigue and was diagnosed with a clinical impression of upper respiratory illness. Her lab workup was essentially unremarkable.  I feel the patient was discharged on Levaquin. The charge nurse will call the patient back now. I think if the patient is feeling much better she can continue her Levaquin and follow up closely with primary care. If she feels like she is not improved or worse at all and she should return to the emergency department immediately.  Carrie Mew, MD 10/10/15 1844    ----------------------------------------- 9:25 PM on 10/10/2015 -----------------------------------------  After multiple attempts, charge nurse was able to get in touch with the patient. She notes that the patient was feeling much better that same day that she was discharged and has continued to feel much improved. Patient will follow up closely with PCP.  Carrie Mew, MD 10/10/15 2126

## 2015-10-11 LAB — BLOOD CULTURE ID PANEL (REFLEXED)
ACINETOBACTER BAUMANNII: NOT DETECTED
CANDIDA ALBICANS: NOT DETECTED
CANDIDA GLABRATA: NOT DETECTED
CANDIDA KRUSEI: NOT DETECTED
Candida parapsilosis: NOT DETECTED
Candida tropicalis: NOT DETECTED
Carbapenem resistance: NOT DETECTED
ENTEROBACTERIACEAE SPECIES: NOT DETECTED
ESCHERICHIA COLI: NOT DETECTED
Enterobacter cloacae complex: NOT DETECTED
Enterococcus species: NOT DETECTED
HAEMOPHILUS INFLUENZAE: NOT DETECTED
Klebsiella oxytoca: NOT DETECTED
Klebsiella pneumoniae: NOT DETECTED
Listeria monocytogenes: NOT DETECTED
METHICILLIN RESISTANCE: DETECTED — AB
NEISSERIA MENINGITIDIS: NOT DETECTED
PSEUDOMONAS AERUGINOSA: NOT DETECTED
Proteus species: NOT DETECTED
SERRATIA MARCESCENS: NOT DETECTED
STREPTOCOCCUS AGALACTIAE: NOT DETECTED
STREPTOCOCCUS PNEUMONIAE: NOT DETECTED
STREPTOCOCCUS PYOGENES: NOT DETECTED
STREPTOCOCCUS SPECIES: NOT DETECTED
Staphylococcus aureus (BCID): NOT DETECTED
Staphylococcus species: DETECTED — AB
Vancomycin resistance: NOT DETECTED

## 2015-10-11 NOTE — ED Provider Notes (Signed)
Called by pharmacy who reports that lab incorrectly reported biofire as staph aureus. Pharmacist notes it is is a staph species but not conclusively aureus and it is methicillin-resistant.  Spoke with patient by phone who reports she continues to feel better and better. She has not had a fever. She reports she will come back to the emergency department if she starts to feel worse. She has also set up an appointment with her PCP  Tina Drafts, MD 10/11/15 [phone]

## 2015-10-11 NOTE — Progress Notes (Signed)
Pharmacy Note - Biofire ID  Received call from microbiology this morning in regards to biofire report for 3/12. Per Melanie in micro, results were INCORRECTLY reported yesterday at staph aureus.   CORRECTED results reported to me this morning as biofire identification of staph species, MecA positive.   Spoke with Dr. Corky Downs this morning, updated with above information.  Rexene Edison, PharmD Clinical Pharmacist  10/11/2015 8:47 AM

## 2015-10-12 LAB — CULTURE, BLOOD (ROUTINE X 2)

## 2015-10-13 ENCOUNTER — Encounter: Payer: Self-pay | Admitting: Family Medicine

## 2015-10-13 ENCOUNTER — Other Ambulatory Visit: Payer: Self-pay

## 2015-10-13 ENCOUNTER — Ambulatory Visit (INDEPENDENT_AMBULATORY_CARE_PROVIDER_SITE_OTHER): Payer: Medicare Other | Admitting: Family Medicine

## 2015-10-13 VITALS — BP 126/78 | HR 72 | Temp 97.5°F | Resp 16 | Wt 185.0 lb

## 2015-10-13 DIAGNOSIS — J40 Bronchitis, not specified as acute or chronic: Secondary | ICD-10-CM | POA: Diagnosis not present

## 2015-10-13 LAB — CULTURE, BLOOD (ROUTINE X 2)

## 2015-10-13 NOTE — Progress Notes (Signed)
Subjective:    Patient ID: Tina [name], female    DOB: 1933-06-30, 82 y.o.   MRN: [id number]  HPI   Follow up ER visit  Patient was seen in ER for weakness, cough, nasal congestion on 10/09/2015. She was treated for bronchitis. Treatment for this included saline drip for dehydration, Proventil inhaler, Tussionex, Levaquin, Prednisone taper. She reports excellent compliance with treatment. Has on more day of Levaquin.  Was given 5 days in addition to dose at hospital.  She reports this condition is Improved. Pt states she is about 60% improved. Pt is c/o constipation secondary to medication changes, as well as leg cramps. ------------------------------------------------------------------------------------   Review of Systems  Constitutional: Positive for fatigue. Negative for fever, chills, diaphoresis, activity change and appetite change.  Respiratory: Positive for cough (improved. Dry). Negative for shortness of breath and wheezing.   Cardiovascular: Positive for chest pain (improved; due to cough) and leg swelling (pt's baseline). Negative for palpitations.  Gastrointestinal: Positive for constipation.  Musculoskeletal: Positive for myalgias ("cramps in my legs").   BP 126/78 mmHg  Pulse 72  Temp(Src) 97.5 F (36.4 C) (Oral)  Resp 16  Wt 185 lb (83.915 kg)  SpO2 96%   Patient Active Problem List   Diagnosis Date Noted  . Abnormal kidney function study 06/08/2015  . Encounter for attention to colostomy ([place]) 06/08/2015  . Clinical depression 06/08/2015  . Calcium blood increased 06/08/2015  . Blood glucose elevated 06/08/2015  . BP (high blood pressure) 06/08/2015  . Cannot sleep 06/08/2015  . Calculus of kidney 06/08/2015  . LBP (low back pain) 06/08/2015  . Adenopathy 06/08/2015  . Venous lake 06/08/2015  . Adynamia 06/08/2015  . Polymyalgia rheumatica ([place]) 05/27/2013  . Coxitis 03/11/2013  . Difficulty hearing 07/08/2009  . Accumulation of fluid in  tissues 02/14/2009  . Menopausal and perimenopausal disorder 02/14/2009  . Excessive urination at night 02/14/2009  . 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 Colon, diverticulosis 08/19/2003  . Diaphragmatic hernia 08/19/2003  . Female genuine stress incontinence 08/19/2002  . Arthritis, degenerative 01/20/2001  . HLD (hyperlipidemia) 02/21/1998  . Adiposity 12/21/1994   No past medical history on file. Current Outpatient Prescriptions on File Prior to Visit  Medication Sig  . albuterol (PROVENTIL HFA;VENTOLIN HFA) 108 (90 Base) MCG/ACT inhaler Inhale 2 puffs into the lungs every 6 (six) hours as needed for wheezing or shortness of breath.  . chlorpheniramine-HYDROcodone (TUSSIONEX PENNKINETIC ER) 10-8 MG/5ML SUER Take 5 mLs by mouth 2 (two) times daily.  Marland Kitchen levofloxacin (LEVAQUIN) 750 MG tablet Take 1 tablet (750 mg total) by mouth daily.  Marland Kitchen lisinopril (PRINIVIL,ZESTRIL) 20 MG tablet Take 1 tablet by mouth daily.  Marland Kitchen loratadine (CLARITIN) 10 MG tablet Take 10 mg by mouth daily as needed.   . metoprolol (LOPRESSOR) 50 MG tablet Take 1 tablet by mouth 2 (two) times daily.  . predniSONE (STERAPRED UNI-PAK 21 TAB) 10 MG (21) TBPK tablet Take 1 tablet (10 mg total) by mouth daily.  . Probiotic Product (NATRUL PROBIOTIC) CAPS Take 1 capsule by mouth daily.  Marland Kitchen tobramycin-dexamethasone (TOBRADEX) ophthalmic solution   . predniSONE (DELTASONE) 5 MG tablet TAKE 1 TABLET DAILY (Patient not taking: Reported on 10/13/2015)   No current facility-administered medications on file prior to visit.   Allergies  Allergen Reactions  . Erythromycin    Past Surgical History  Procedure Laterality Date  . Knee surgery Left 2004    total knee replacement   . Abdominal hysterectomy  1980's  .  Dilation and curettage of uterus  1970's  . Hip surgery Right 06/01/2013  . Colon surgery  06/08/13    s/p ruptured diverticulitis, has fistula; had TPN for 3 mths   Social History   Social History  .  Marital Status: Single    Spouse Name: N/A  . Number of Children: N/A  . Years of Education: N/A   Occupational History  . Not on file.   Social History Main Topics  . Smoking status: Former Smoker    Quit date: 07/30/1979  . Smokeless tobacco: Never Used  . Alcohol Use: No  . Drug Use: No  . Sexual Activity: Not on file   Other Topics Concern  . Not on file   Social History Narrative   Family History  Problem Relation Age of Onset  . Cancer Mother     breast      Objective:   Physical Exam  Constitutional: She is oriented to person, place, and time. She appears well-developed and well-nourished.  Cardiovascular: Normal rate and regular rhythm.   Pulmonary/Chest: Effort normal and breath sounds normal.  Neurological: She is alert and oriented to person, place, and time.  Psychiatric: She has a normal mood and affect. Her behavior is normal. Judgment and thought content normal.   BP 126/78 mmHg  Pulse 72  Temp(Src) 97.5 F (36.4 C) (Oral)  Resp 16  Wt 185 lb (83.915 kg)  SpO2 96%      Assessment & Plan:  1. Bronchitis Improving. Will check labs. Do not need to take last dose of Levaquin as has completed 5 days of high dose. Patient instructed to call back if condition worsens or does not continue to improve.    - Comprehensive metabolic panel - CBC with Differential/Platelet   Patient was seen and examined by Jerrell Belfast, MD, and note scribed by Renaldo Fiddler, CMA. I have reviewed the document for accuracy and completeness and I agree with above. Jerrell Belfast, MD   Margarita Rana, MD

## 2015-11-22 ENCOUNTER — Other Ambulatory Visit: Payer: Self-pay | Admitting: Family Medicine

## 2015-11-22 DIAGNOSIS — I10 Essential (primary) hypertension: Secondary | ICD-10-CM

## 2015-12-09 ENCOUNTER — Other Ambulatory Visit: Payer: Self-pay | Admitting: Family Medicine

## 2015-12-09 DIAGNOSIS — I10 Essential (primary) hypertension: Secondary | ICD-10-CM

## 2015-12-29 ENCOUNTER — Telehealth: Payer: Self-pay | Admitting: Family Medicine

## 2015-12-29 NOTE — Telephone Encounter (Signed)
Wrote rx. She can bring it to a medical supply store to get the correct size. Thanks.

## 2015-12-29 NOTE — Telephone Encounter (Signed)
Pt called saying the last time she was in she had talked to Dr. Venia Minks about getting a rX for support hose for her ankles a swelling.     She said her ankles are swelling again and wants to know if you will write a RX for them.  She is not sure how to go about ordering them or getting them locally at the pharmacy  Please advise.  Pt's callback is [phone]  Thanks Con Memos

## 2015-12-29 NOTE — Telephone Encounter (Signed)
Please review. Thanks!  

## 2016-01-16 DIAGNOSIS — B301 Conjunctivitis due to adenovirus: Secondary | ICD-10-CM | POA: Diagnosis not present

## 2016-01-19 DIAGNOSIS — H10503 Unspecified blepharoconjunctivitis, bilateral: Secondary | ICD-10-CM | POA: Diagnosis not present

## 2016-01-20 DIAGNOSIS — H10503 Unspecified blepharoconjunctivitis, bilateral: Secondary | ICD-10-CM | POA: Diagnosis not present

## 2016-02-05 ENCOUNTER — Other Ambulatory Visit: Payer: Self-pay | Admitting: Family Medicine

## 2016-02-09 DIAGNOSIS — H10503 Unspecified blepharoconjunctivitis, bilateral: Secondary | ICD-10-CM | POA: Diagnosis not present

## 2016-03-02 ENCOUNTER — Telehealth: Payer: Self-pay | Admitting: Physician Assistant

## 2016-03-02 NOTE — Telephone Encounter (Signed)
Please Review. I don't remember seeing any paper work for her.

## 2016-03-02 NOTE — Telephone Encounter (Signed)
Pt called saying [facility] faxed an order yesterday for her colostomy supplies.  She is wanting to know if we received the order and if we have filled it yet, or signed and faxed the order back .    She was Dr. Sharyon Medicus patient.  He call back is [phone]  Thanks Con Memos

## 2016-03-02 NOTE — Telephone Encounter (Signed)
I have not seen any either? May have went to another provider.Marland KitchenMarland KitchenThey can refax and attn to you or me so we get it.

## 2016-03-05 NOTE — Telephone Encounter (Signed)
Pt called back for an update. I advised that we haven't received a fax. Pt plans to have them refax it attention to [place]. Thanks TNP

## 2016-03-06 NOTE — Telephone Encounter (Signed)
Pt called asking again about the faxed order from [facility]  I told her we would call her when we received the fax.  Thanks Con Memos

## 2016-03-06 NOTE — Telephone Encounter (Signed)
Pt stated [facility] is going to re-fax the form to [place] today. Thanks TNP

## 2016-03-06 NOTE — Telephone Encounter (Signed)
Spoke with Tina [name] ([facility] rep.) that states they are still waiting for Insurance approval before faxing the written order to [facility]. Spoke with patient regarding the conversation I had with [facility] and she states that she is going to call them to see how long this is going to take.

## 2016-03-12 ENCOUNTER — Encounter: Payer: Self-pay | Admitting: Physician Assistant

## 2016-03-12 ENCOUNTER — Ambulatory Visit (INDEPENDENT_AMBULATORY_CARE_PROVIDER_SITE_OTHER): Payer: Medicare Other | Admitting: Physician Assistant

## 2016-03-12 VITALS — BP 140/80 | HR 72 | Temp 97.6°F | Resp 16 | Wt 188.2 lb

## 2016-03-12 DIAGNOSIS — Z433 Encounter for attention to colostomy: Secondary | ICD-10-CM | POA: Diagnosis not present

## 2016-03-12 NOTE — Progress Notes (Signed)
       Patient: Tina [name] Female    DOB: 08-Feb-1933   82 y.o.   MRN: [id number] Visit Date: 03/12/2016  Today's Provider: Mar Daring, PA-C   Chief Complaint  Patient presents with  . Discuss Medication   Subjective:    HPI Patient reports that she feels great today and that she wants to meet the provider. There had been some issues with getting her colostomy supplies following Dr. Venia Minks leaving, but this has finally been worked out and she received her supplies on Saturday. She states this did cause her some stress and she wanted to meet me today to hopefully help her get over the frustration and anger she felt she was holding on to due to this situation.     Allergies  Allergen Reactions  . Erythromycin    Current Meds  Medication Sig  . lisinopril (PRINIVIL,ZESTRIL) 20 MG tablet TAKE 1 TABLET DAILY  . metoprolol (LOPRESSOR) 50 MG tablet TAKE 1 TABLET TWICE A DAY  . predniSONE (DELTASONE) 5 MG tablet TAKE 1 TABLET DAILY    Review of Systems  Constitutional: Negative.   Respiratory: Negative.   Cardiovascular: Negative.   Gastrointestinal: Negative.   Psychiatric/Behavioral: Negative.     Social History  Substance Use Topics  . Smoking status: Former Smoker    Quit date: 07/30/1979  . Smokeless tobacco: Never Used  . Alcohol use No   Objective:   BP 140/80 (BP Location: Left Arm, Patient Position: Sitting, Cuff Size: Normal)   Pulse 72   Temp 97.6 F (36.4 C) (Oral)   Resp 16   Wt 188 lb 3.2 oz (85.4 kg)   BMI 32.30 kg/m   Physical Exam  Constitutional: She appears well-developed and well-nourished. No distress.  Cardiovascular: Normal rate, regular rhythm and normal heart sounds.  Exam reveals no gallop and no friction rub.   No murmur heard. Pulmonary/Chest: Effort normal and breath sounds normal. No respiratory distress. She has no wheezes. She has no rales.  Skin: She is not diaphoretic.  Psychiatric: She has a normal mood and  affect. Her behavior is normal. Judgment and thought content normal.  Vitals reviewed.     Assessment & Plan:     1. Encounter for attention to colostomy [facility]) Has supplies now and is doing well. Feels that since meeting me and being able to discuss the issue and her feelings during this time has helped her to relax and let go of the anger and stress associated. She is to call if she needs anything in the future. Will see her in the fall for her annual wellness.       Mar Daring, PA-C  [facility]

## 2016-03-12 NOTE — Telephone Encounter (Signed)
Paper work received and faxed back.   Thanks,  -Joseline

## 2016-03-15 ENCOUNTER — Other Ambulatory Visit: Payer: Self-pay

## 2016-03-15 DIAGNOSIS — I10 Essential (primary) hypertension: Secondary | ICD-10-CM

## 2016-03-15 MED ORDER — METOPROLOL TARTRATE 50 MG PO TABS: 50.0000 mg | ORAL_TABLET | Freq: Two times a day (BID) | ORAL | 1 refills | Status: DC

## 2016-03-15 MED ORDER — LISINOPRIL 20 MG PO TABS: 20.0000 mg | ORAL_TABLET | Freq: Every day | ORAL | 1 refills | Status: DC

## 2016-03-15 NOTE — Telephone Encounter (Signed)
Refill request received from [facility] requesting Metoprolol 50 mg and Lisinopril 20 mg.   Thanks, [employer]

## 2016-06-12 DIAGNOSIS — Z23 Encounter for immunization: Secondary | ICD-10-CM | POA: Diagnosis not present

## 2016-06-15 DIAGNOSIS — H16202 Unspecified keratoconjunctivitis, left eye: Secondary | ICD-10-CM | POA: Diagnosis not present

## 2016-06-19 DIAGNOSIS — H2513 Age-related nuclear cataract, bilateral: Secondary | ICD-10-CM | POA: Diagnosis not present

## 2016-07-10 DIAGNOSIS — H01019 Ulcerative blepharitis unspecified eye, unspecified eyelid: Secondary | ICD-10-CM | POA: Diagnosis not present

## 2016-07-12 ENCOUNTER — Telehealth: Payer: Self-pay | Admitting: Physician Assistant

## 2016-07-12 NOTE — Telephone Encounter (Signed)
MEDICARE PT Called Pt to schedule AWV with [facility] for 1pm on 1/3 (change CPE visit type to OV)- knb

## 2016-07-17 ENCOUNTER — Encounter: Payer: Self-pay | Admitting: Physician Assistant

## 2016-07-17 ENCOUNTER — Ambulatory Visit (INDEPENDENT_AMBULATORY_CARE_PROVIDER_SITE_OTHER): Payer: Medicare Other | Admitting: Physician Assistant

## 2016-07-17 VITALS — BP 136/84 | HR 76 | Temp 97.5°F | Resp 16 | Wt 192.0 lb

## 2016-07-17 DIAGNOSIS — B372 Candidiasis of skin and nail: Secondary | ICD-10-CM

## 2016-07-17 MED ORDER — NYSTATIN 100000 UNIT/GM EX POWD: Freq: Three times a day (TID) | CUTANEOUS | 0 refills | Status: DC

## 2016-07-17 NOTE — Progress Notes (Signed)
Patient: Tina [name] Female    DOB: October 18, 1932   82 y.o.   MRN: [id number] Visit Date: 07/17/2016  Today's Provider: Trinna Post, PA-C   Chief Complaint  Patient presents with  . Rash    Under left breast; started about five days ago.    Subjective:    Rash  This is a new problem. The current episode started in the past 7 days. The affected locations include the chest (Under left breast). The rash is characterized by draining, redness and itchiness. She was exposed to nothing. Pertinent negatives include no eye pain, fatigue or fever. Past treatments include anti-itch cream. The treatment provided mild relief.       Allergies  Allergen Reactions  . Erythromycin      Current Outpatient Prescriptions:  .  lisinopril (PRINIVIL,ZESTRIL) 20 MG tablet, Take 1 tablet (20 mg total) by mouth daily., Disp: 90 tablet, Rfl: 1 .  metoprolol (LOPRESSOR) 50 MG tablet, Take 1 tablet (50 mg total) by mouth 2 (two) times daily., Disp: 180 tablet, Rfl: 1 .  predniSONE (DELTASONE) 5 MG tablet, TAKE 1 TABLET DAILY, Disp: 90 tablet, Rfl: 2 .  Probiotic Product (NATRUL PROBIOTIC) CAPS, Take 1 capsule by mouth daily., Disp: , Rfl:  .  tobramycin-dexamethasone (TOBRADEX) ophthalmic solution, , Disp: , Rfl:  .  albuterol (PROVENTIL HFA;VENTOLIN HFA) 108 (90 Base) MCG/ACT inhaler, Inhale 2 puffs into the lungs every 6 (six) hours as needed for wheezing or shortness of breath. (Patient not taking: Reported on 07/17/2016), Disp: 1 Inhaler, Rfl: 2 .  loratadine (CLARITIN) 10 MG tablet, Take 10 mg by mouth daily as needed. , Disp: , Rfl:  .  nystatin (MYCOSTATIN/NYSTOP) powder, Apply topically 3 (three) times daily., Disp: 15 g, Rfl: 0  Review of Systems  Constitutional: Negative.  Negative for fatigue and fever.  Eyes: Negative for pain.  Gastrointestinal: Negative.   Skin: Positive for rash. Negative for color change, pallor and wound.  Psychiatric/Behavioral: Positive for sleep  disturbance. The patient is nervous/anxious.        Has a new neighbor moving in, which is causing some stress.      Social History  Substance Use Topics  . Smoking status: Former Smoker    Quit date: 07/30/1979  . Smokeless tobacco: Never Used  . Alcohol use No   Objective:   BP 136/84 (BP Location: Left Arm, Patient Position: Sitting, Cuff Size: Large)   Pulse 76   Temp 97.5 F (36.4 C) (Oral)   Resp 16   Wt 192 lb (87.1 kg)   BMI 32.96 kg/m   Physical Exam  Constitutional: She is oriented to person, place, and time. She appears well-developed and well-nourished.  Cardiovascular: Normal rate.   Pulmonary/Chest: Effort normal.  Abdominal:    Neurological: She is alert and oriented to person, place, and time.  Skin: Skin is warm and dry. Rash noted. There is erythema.  Psychiatric: She has a normal mood and affect. Her behavior is normal.        Assessment & Plan:      Problem List Items Addressed This Visit    None    Visit Diagnoses    Skin yeast infection    -  Primary   Relevant Medications   nystatin (MYCOSTATIN/NYSTOP) powder     Patient is 82 y/o presenting with signs and symptoms consistent with yeast infection of her skin. Will give nystatin powder. Patient to call back if not improving.  Should keep area clean and dry.  The entirety of the information documented in the History of Present Illness, Review of Systems and Physical Exam were personally obtained by me. Portions of this information were initially documented by Ashley Royalty, CMA and reviewed by me for thoroughness and accuracy.   There are no Patient Instructions on file for this visit.  Return if symptoms worsen or fail to improve.       Trinna Post, PA-C  [facility]

## 2016-08-01 ENCOUNTER — Encounter: Payer: Self-pay | Admitting: Physician Assistant

## 2016-08-01 ENCOUNTER — Ambulatory Visit (INDEPENDENT_AMBULATORY_CARE_PROVIDER_SITE_OTHER): Payer: Medicare Other | Admitting: Physician Assistant

## 2016-08-01 VITALS — BP 120/70 | HR 76 | Temp 98.5°F | Resp 16 | Ht 64.0 in | Wt 194.0 lb

## 2016-08-01 DIAGNOSIS — E7800 Pure hypercholesterolemia, unspecified: Secondary | ICD-10-CM

## 2016-08-01 DIAGNOSIS — R739 Hyperglycemia, unspecified: Secondary | ICD-10-CM | POA: Diagnosis not present

## 2016-08-01 DIAGNOSIS — R944 Abnormal results of kidney function studies: Secondary | ICD-10-CM | POA: Diagnosis not present

## 2016-08-01 DIAGNOSIS — I10 Essential (primary) hypertension: Secondary | ICD-10-CM | POA: Diagnosis not present

## 2016-08-01 DIAGNOSIS — Z23 Encounter for immunization: Secondary | ICD-10-CM

## 2016-08-01 DIAGNOSIS — Z0000 Encounter for general adult medical examination without abnormal findings: Secondary | ICD-10-CM

## 2016-08-01 NOTE — Progress Notes (Signed)
Patient: Tina [name], Female    DOB: June 07, 1933, 83 y.o.   MRN: [id number] Visit Date: 08/01/2016  Today's Provider: Mar Daring, PA-C   Chief Complaint  Patient presents with  . Medicare Wellness   Subjective:    Annual wellness visit Tina [name] is a 83 y.o. female. She feels well. She reports exercising active with daily activites. She reports she is sleeping fairly well.  06/18/15 AWE 06/16/15 BMD-normal 08/19/03 Colonoscopy-diverticulosis 10/09/11 Mammogram-BI-RADS 1 -----------------------------------------------------------  Review of Systems  Constitutional: Negative.   HENT: Negative.   Eyes: Positive for discharge.  Respiratory: Negative.   Cardiovascular: Negative.   Gastrointestinal: Negative.   Endocrine: Negative.   Genitourinary: Positive for enuresis and frequency.  Musculoskeletal: Positive for arthralgias.  Skin: Positive for rash.  Allergic/Immunologic: Negative.   Neurological: Positive for light-headedness.  Hematological: Bruises/bleeds easily.  Psychiatric/Behavioral: Positive for sleep disturbance.  These are chronic issues  Social History   Social History  . Marital status: Single    Spouse name: N/A  . Number of children: 1  . Years of education: N/A   Occupational History  . Not on file.   Social History Main Topics  . Smoking status: Former Smoker    Quit date: 07/30/1979  . Smokeless tobacco: Never Used  . Alcohol use No  . Drug use: No  . Sexual activity: Not on file   Other Topics Concern  . Not on file   Social History Narrative  . No narrative on file    History reviewed. No pertinent past medical history.   Patient Active Problem List   Diagnosis Date Noted  . Abnormal kidney function study 06/08/2015  . Encounter for attention to colostomy ([place]) 06/08/2015  . Clinical depression 06/08/2015  . Calcium blood increased 06/08/2015  . Blood glucose elevated 06/08/2015  . BP (high  blood pressure) 06/08/2015  . Cannot sleep 06/08/2015  . Calculus of kidney 06/08/2015  . LBP (low back pain) 06/08/2015  . Adenopathy 06/08/2015  . Venous lake 06/08/2015  . Adynamia 06/08/2015  . Polymyalgia rheumatica ([place]) 05/27/2013  . Coxitis 03/11/2013  . Difficulty hearing 07/08/2009  . Accumulation of fluid in tissues 02/14/2009  . Menopausal and perimenopausal disorder 02/14/2009  . Excessive urination at night 02/14/2009  . 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 Colon, diverticulosis 08/19/2003  . Diaphragmatic hernia 08/19/2003  . Female genuine stress incontinence 08/19/2002  . Arthritis, degenerative 01/20/2001  . HLD (hyperlipidemia) 02/21/1998  . Adiposity 12/21/1994    Past Surgical History:  Procedure Laterality Date  . ABDOMINAL HYSTERECTOMY  1980's  . COLON SURGERY  06/08/13   s/p ruptured diverticulitis, has fistula; had TPN for 3 mths  . DILATION AND CURETTAGE OF UTERUS  1970's  . HIP SURGERY Right 06/01/2013  . KNEE SURGERY Left 2004   total knee replacement     Her family history includes Cancer in her mother.      Current Outpatient Prescriptions:  .  lisinopril (PRINIVIL,ZESTRIL) 20 MG tablet, Take 1 tablet (20 mg total) by mouth daily., Disp: 90 tablet, Rfl: 1 .  metoprolol (LOPRESSOR) 50 MG tablet, Take 1 tablet (50 mg total) by mouth 2 (two) times daily., Disp: 180 tablet, Rfl: 1 .  predniSONE (DELTASONE) 5 MG tablet, TAKE 1 TABLET DAILY, Disp: 90 tablet, Rfl: 2 .  Probiotic Product (NATRUL PROBIOTIC) CAPS, Take 1 capsule by mouth daily., Disp: , Rfl:  .  loratadine (CLARITIN) 10 MG tablet, Take 10 mg  by mouth daily as needed. , Disp: , Rfl:   Patient Care Team: Mar Daring, PA-C as PCP - General (Family Medicine)     Objective:   Vitals: BP 120/70 (BP Location: Right Arm, Patient Position: Sitting, Cuff Size: Large)   Pulse 76   Temp 98.5 F (36.9 C) (Oral)   Resp 16   Ht 5\' 4"  (1.626 m)   Wt 194 lb (88 kg)   BMI  33.30 kg/m   Physical Exam  Constitutional: She is oriented to person, place, and time. She appears well-developed and well-nourished. No distress.  HENT:  Head: Normocephalic and atraumatic.  Right Ear: Tympanic membrane, external ear and ear canal normal. Decreased hearing is noted.  Left Ear: Tympanic membrane, external ear and ear canal normal. Decreased hearing is noted.  Nose: Nose normal.  Mouth/Throat: Uvula is midline, oropharynx is clear and moist and mucous membranes are normal. No oropharyngeal exudate.  Eyes: Conjunctivae and EOM are normal. Pupils are equal, round, and reactive to light. Right eye exhibits no discharge. Left eye exhibits no discharge. No scleral icterus.  Neck: Normal range of motion. Neck supple. No JVD present. Carotid bruit is not present. No tracheal deviation present. No thyromegaly present.  Cardiovascular: Normal rate, regular rhythm, normal heart sounds and intact distal pulses.  Exam reveals no gallop and no friction rub.   No murmur heard. Pulmonary/Chest: Effort normal and breath sounds normal. No respiratory distress. She has no wheezes. She has no rales. She exhibits no tenderness.  Abdominal: Soft. Bowel sounds are normal. She exhibits no distension and no mass. There is no tenderness. There is no rebound and no guarding.    Musculoskeletal: Normal range of motion. She exhibits no edema or tenderness.  Lymphadenopathy:    She has no cervical adenopathy.  Neurological: She is alert and oriented to person, place, and time.  Skin: Skin is warm and dry. No rash noted. She is not diaphoretic.  Psychiatric: She has a normal mood and affect. Her behavior is normal. Judgment and thought content normal.  Vitals reviewed.   Activities of Daily Living In your present state of health, do you have any difficulty performing the following activities: 08/01/2016  Hearing? Y  Vision? N  Walking or climbing stairs? Y  Dressing or bathing? N  Doing errands,  shopping? N  Some recent data might be hidden    Fall Risk Assessment Fall Risk  08/01/2016 06/08/2015  Falls in the past year? No No     Depression Screen PHQ 2/9 Scores 08/01/2016 06/08/2015  PHQ - 2 Score 0 0    Cognitive Testing - 6-CIT  Correct? Score   What year is it? no 0 0 or 4  What month is it? yes 0 0 or 3  Memorize:    Pia Mau,  44,  [place],      What time is it? (within 1 hour) yes 0 0 or 3  Count backwards from 20 yes 0 0, 2, or 4  Name the months of the year yes 0 0, 2, or 4  Repeat name & address above yes 0 0, 2, 4, 6, 8, or 10       TOTAL SCORE  0/28   Interpretation:  Normal  Normal (0-7) Abnormal (8-28)     Assessment & Plan:     Annual Wellness Visit  Reviewed patient's Family Medical History Reviewed and updated list of patient's medical providers Assessment of cognitive impairment was  done Assessed patient's functional ability Established a written schedule for health screening [place] Completed and Reviewed  Exercise Activities and Dietary recommendations Goals    . Exercise 150 minutes per week (moderate activity)       Immunization History  Administered Date(s) Administered  . Influenza Split 05/13/2012  . Pneumococcal Conjugate-13 06/08/2015  . Td 02/09/2008    Health Maintenance  Topic Date Due  . ZOSTAVAX  10/24/1994  . PNA vac Low Risk Adult (2 of 2 - PPSV23) 06/07/2016  . INFLUENZA VACCINE  05/14/2018 (Originally 02/28/2016)  . TETANUS/TDAP  02/08/2018  . DEXA SCAN  Completed     Discussed health benefits of physical activity, and encouraged her to engage in regular exercise appropriate for her age and condition.    1. Medicare annual wellness visit, subsequent Normal physical exam today.   2. Essential hypertension Stable. Will check labs as below and f/u pending results. - CBC w/Diff/Platelet - Comprehensive Metabolic Panel (CMET)  3. Abnormal kidney function study Will check  labs as below and f/u pending results. - Comprehensive Metabolic Panel (CMET)  4. Blood glucose elevated Will check labs as below and f/u pending results. - Comprehensive Metabolic Panel (CMET) - HgB A1c  5. Pure hypercholesterolemia Will check labs as below and f/u pending results. - Lipid Profile  6. Need for pneumococcal vaccination Pneumococcal 23 vaccine given to patient without complications. Patient sat for 15 minutes after administration and was tolerated well without adverse effects. - Pneumococcal polysaccharide vaccine 23-valent greater than or equal to 2yo subcutaneous/IM  ------------------------------------------------------------------------------------------------------------    Mar Daring, PA-C  [facility]

## 2016-08-01 NOTE — Patient Instructions (Signed)

## 2016-08-09 DIAGNOSIS — R944 Abnormal results of kidney function studies: Secondary | ICD-10-CM | POA: Diagnosis not present

## 2016-08-09 DIAGNOSIS — I10 Essential (primary) hypertension: Secondary | ICD-10-CM | POA: Diagnosis not present

## 2016-08-09 DIAGNOSIS — R739 Hyperglycemia, unspecified: Secondary | ICD-10-CM | POA: Diagnosis not present

## 2016-08-09 DIAGNOSIS — E7800 Pure hypercholesterolemia, unspecified: Secondary | ICD-10-CM | POA: Diagnosis not present

## 2016-08-10 LAB — COMPREHENSIVE METABOLIC PANEL
ALK PHOS: 64 IU/L (ref 39–117)
ALT: 11 IU/L (ref 0–32)
AST: 16 IU/L (ref 0–40)
Albumin/Globulin Ratio: 1.4 (ref 1.2–2.2)
Albumin: 3.8 g/dL (ref 3.5–4.7)
BILIRUBIN TOTAL: 0.3 mg/dL (ref 0.0–1.2)
BUN/Creatinine Ratio: 28 (ref 12–28)
BUN: 29 mg/dL — AB (ref 8–27)
CHLORIDE: 99 mmol/L (ref 96–106)
CO2: 27 mmol/L (ref 18–29)
CREATININE: 1.02 mg/dL — AB (ref 0.57–1.00)
Calcium: 9.4 mg/dL (ref 8.7–10.3)
GFR calc Af Amer: 60 mL/min/{1.73_m2} (ref >59)
GFR calc non Af Amer: 52 mL/min/{1.73_m2} — ABNORMAL LOW (ref >59)
GLUCOSE: 95 mg/dL (ref 65–99)
Globulin, Total: 2.7 g/dL (ref 1.5–4.5)
Potassium: 4 mmol/L (ref 3.5–5.2)
Sodium: 143 mmol/L (ref 134–144)
Total Protein: 6.5 g/dL (ref 6.0–8.5)

## 2016-08-10 LAB — HEMOGLOBIN A1C
Est. average glucose Bld gHb Est-mCnc: 111 mg/dL
HEMOGLOBIN A1C: 5.5 % (ref 4.8–5.6)

## 2016-08-10 LAB — CBC WITH DIFFERENTIAL/PLATELET
BASOS ABS: 0 10*3/uL (ref 0.0–0.2)
Basos: 1 %
EOS (ABSOLUTE): 0.4 10*3/uL (ref 0.0–0.4)
Eos: 5 %
HEMOGLOBIN: 12.7 g/dL (ref 11.1–15.9)
Hematocrit: 39.1 % (ref 34.0–46.6)
Immature Grans (Abs): 0 10*3/uL (ref 0.0–0.1)
Immature Granulocytes: 0 %
LYMPHS ABS: 2.2 10*3/uL (ref 0.7–3.1)
Lymphs: 32 %
MCH: 30.3 pg (ref 26.6–33.0)
MCHC: 32.5 g/dL (ref 31.5–35.7)
MCV: 93 fL (ref 79–97)
MONOCYTES: 9 %
Monocytes Absolute: 0.6 10*3/uL (ref 0.1–0.9)
Neutrophils Absolute: 3.7 10*3/uL (ref 1.4–7.0)
Neutrophils: 53 %
Platelets: 273 10*3/uL (ref 150–379)
RBC: 4.19 x10E6/uL (ref 3.77–5.28)
RDW: 14.2 % (ref 12.3–15.4)
WBC: 7 10*3/uL (ref 3.4–10.8)

## 2016-08-10 LAB — LIPID PANEL
Chol/HDL Ratio: 2.8 ratio units (ref 0.0–4.4)
Cholesterol, Total: 190 mg/dL (ref 100–199)
HDL: 69 mg/dL (ref >39)
LDL Calculated: 97 mg/dL (ref 0–99)
TRIGLYCERIDES: 118 mg/dL (ref 0–149)
VLDL Cholesterol Cal: 24 mg/dL (ref 5–40)

## 2016-09-04 ENCOUNTER — Ambulatory Visit (INDEPENDENT_AMBULATORY_CARE_PROVIDER_SITE_OTHER): Payer: Medicare Other | Admitting: Physician Assistant

## 2016-09-04 ENCOUNTER — Encounter: Payer: Self-pay | Admitting: Physician Assistant

## 2016-09-04 VITALS — BP 118/74 | HR 68 | Temp 98.4°F | Resp 16 | Wt 188.0 lb

## 2016-09-04 DIAGNOSIS — N3090 Cystitis, unspecified without hematuria: Secondary | ICD-10-CM

## 2016-09-04 LAB — POCT URINALYSIS DIPSTICK
Bilirubin, UA: NEGATIVE
Glucose, UA: NEGATIVE
Ketones, UA: NEGATIVE
Protein, UA: 100
Spec Grav, UA: 1.015
Urobilinogen, UA: 0.2
pH, UA: 5

## 2016-09-04 MED ORDER — SULFAMETHOXAZOLE-TRIMETHOPRIM 800-160 MG PO TABS: 1.0000 | ORAL_TABLET | Freq: Two times a day (BID) | ORAL | 0 refills | Status: DC

## 2016-09-04 NOTE — Progress Notes (Signed)
[place]  Chief Complaint  Patient presents with  . Urinary Tract Infection    Started about five days ago.     Subjective:    Patient ID: Tina [name], female    DOB: 08/18/1932, 83 y.o.   MRN: [id number]   Urinary Tract Infection: Patient complains of dysuria and frequency She has had symptoms for 5 days. Patient denies back pain, fever and stomach ache. Patient does have a history of recurrent UTI.  Pt reports it has been a long time since her last UTI.  Patient does not have a history of pyelonephritis or other renal issues. Patient denies vaginal discharge and denies new sexual partners. The patient denies recent travel outside of the [place].  Review of Systems  Constitutional: Positive for malaise/fatigue. Negative for chills, diaphoresis, fever and weight loss.  Gastrointestinal: Negative.   Genitourinary: Positive for frequency and urgency. Negative for dysuria, flank pain and hematuria.  Neurological: Negative for dizziness, weakness and headaches.       Objective:   BP 118/74 (BP Location: Left Arm, Patient Position: Sitting, Cuff Size: Large)   Pulse 68   Temp 98.4 F (36.9 C) (Oral)   Resp 16   Wt 188 lb (85.3 kg)   BMI 32.27 kg/m   Patient Active Problem List   Diagnosis Date Noted  . Abnormal kidney function study 06/08/2015  . Encounter for attention to colostomy ([place]) 06/08/2015  . Clinical depression 06/08/2015  . Calcium blood increased 06/08/2015  . Blood glucose elevated 06/08/2015  . BP (high blood pressure) 06/08/2015  . Cannot sleep 06/08/2015  . Calculus of kidney 06/08/2015  . LBP (low back pain) 06/08/2015  . Adenopathy 06/08/2015  . Venous lake 06/08/2015  . Adynamia 06/08/2015  . Polymyalgia rheumatica ([place]) 05/27/2013  . Coxitis 03/11/2013  . Difficulty hearing 07/08/2009  . Accumulation of fluid in tissues 02/14/2009  . Menopausal and perimenopausal disorder 02/14/2009  .  Excessive urination at night 02/14/2009  . 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 Colon, diverticulosis 08/19/2003  . Diaphragmatic hernia 08/19/2003  . Female genuine stress incontinence 08/19/2002  . Arthritis, degenerative 01/20/2001  . HLD (hyperlipidemia) 02/21/1998  . Adiposity 12/21/1994    Outpatient Encounter Prescriptions as of 09/04/2016  Medication Sig Note  . lisinopril (PRINIVIL,ZESTRIL) 20 MG tablet Take 1 tablet (20 mg total) by mouth daily.   . metoprolol (LOPRESSOR) 50 MG tablet Take 1 tablet (50 mg total) by mouth 2 (two) times daily.   . predniSONE (DELTASONE) 5 MG tablet TAKE 1 TABLET DAILY   . sulfamethoxazole-trimethoprim (BACTRIM DS) 800-160 MG tablet Take 1 tablet by mouth 2 (two) times daily.   . [DISCONTINUED] loratadine (CLARITIN) 10 MG tablet Take 10 mg by mouth daily as needed.  06/08/2015: Received from: [facility]  . [DISCONTINUED] Probiotic Product (NATRUL PROBIOTIC) CAPS Take 1 capsule by mouth daily. 06/08/2015: Received from: [facility]: Take by mouth.   No facility-administered encounter medications on file as of 09/04/2016.     Allergies  Allergen Reactions  . Erythromycin        Physical Exam  Constitutional: She is oriented to person, place, and time. She appears well-developed and well-nourished. No distress.  Cardiovascular: Normal rate and regular rhythm.   Pulmonary/Chest: Effort normal and breath sounds normal.  Abdominal: Soft. Bowel sounds are normal. She exhibits no distension. There is tenderness in the suprapubic area. There is no rebound, no guarding and no CVA  tenderness.  Neurological: She is alert and oriented to person, place, and time.  Skin: Skin is warm and dry. She is not diaphoretic.  Psychiatric: She has a normal mood and affect. Her behavior is normal.       Assessment & Plan:   1. Cystitis  Eval and treat as below. Patient to call back with any drug  reactions or issues. Return precautions counseled.  - POCT urinalysis dipstick - Urine Culture - sulfamethoxazole-trimethoprim (BACTRIM DS) 800-160 MG tablet; Take 1 tablet by mouth 2 (two) times daily.  Dispense: 6 tablet; Refill: 0  Return if symptoms worsen or fail to improve.   Patient Instructions  Urinary Tract Infection, Adult A urinary tract infection (UTI) is an infection of any part of the urinary tract, which includes the kidneys, ureters, bladder, and urethra. These organs make, store, and get rid of urine in the body. UTI can be a bladder infection (cystitis) or kidney infection (pyelonephritis). What are the causes? This infection may be caused by fungi, viruses, or bacteria. Bacteria are the most common cause of UTIs. This condition can also be caused by repeated incomplete emptying of the bladder during urination. What increases the risk? This condition is more likely to develop if:  You ignore your need to urinate or hold urine for long periods of time.  You do not empty your bladder completely during urination.  You wipe back to front after urinating or having a bowel movement, if you are female.  You are uncircumcised, if you are female.  You are constipated.  You have a urinary catheter that stays in place (indwelling).  You have a weak defense (immune) system.  You have a medical condition that affects your bowels, kidneys, or bladder.  You have diabetes.  You take antibiotic medicines frequently or for long periods of time, and the antibiotics no longer work well against certain types of infections (antibiotic resistance).  You take medicines that irritate your urinary tract.  You are exposed to chemicals that irritate your urinary tract.  You are female. What are the signs or symptoms? Symptoms of this condition include:  Fever.  Frequent urination or passing small amounts of urine frequently.  Needing to urinate urgently.  Pain or burning with  urination.  Urine that smells bad or unusual.  Cloudy urine.  Pain in the lower abdomen or back.  Trouble urinating.  Blood in the urine.  Vomiting or being less hungry than normal.  Diarrhea or abdominal pain.  Vaginal discharge, if you are female. How is this diagnosed? This condition is diagnosed with a medical history and physical exam. You will also need to provide a urine sample to test your urine. Other tests may be done, including:  Blood tests.  Sexually transmitted disease (STD) testing. If you have had more than one UTI, a cystoscopy or imaging studies may be done to determine the cause of the infections. How is this treated? Treatment for this condition often includes a combination of two or more of the following:  Antibiotic medicine.  Other medicines to treat less common causes of UTI.  Over-the-counter medicines to treat pain.  Drinking enough water to stay hydrated. Follow these instructions at home:  Take over-the-counter and prescription medicines only as told by your health care provider.  If you were prescribed an antibiotic, take it as told by your health care provider. Do not stop taking the antibiotic even if you start to feel better.  Avoid alcohol, caffeine, tea, and carbonated  beverages. They can irritate your bladder.  Drink enough fluid to keep your urine clear or pale yellow.  Keep all follow-up visits as told by your health care provider. This is important.  Make sure to:  Empty your bladder often and completely. Do not hold urine for long periods of time.  Empty your bladder before and after sex.  Wipe from front to back after a bowel movement if you are female. Use each tissue one time when you wipe. Contact a health care provider if:  You have back pain.  You have a fever.  You feel nauseous or vomit.  Your symptoms do not get better after 3 days.  Your symptoms go away and then return. Get help right away if:  You  have severe back pain or lower abdominal pain.  You are vomiting and cannot keep down any medicines or water. This information is not intended to replace advice given to you by your health care provider. Make sure you discuss any questions you have with your health care provider. Document Released: 04/25/2005 Document Revised: 12/28/2015 Document Reviewed: 06/06/2015 Elsevier Interactive Patient Education  2017 [employer].    The entirety of the information documented in the History of Present Illness, Review of Systems and Physical Exam were personally obtained by me. Portions of this information were initially documented by Ashley Royalty, CMA and reviewed by me for thoroughness and accuracy.

## 2016-09-04 NOTE — Patient Instructions (Signed)

## 2016-09-06 ENCOUNTER — Telehealth: Payer: Self-pay | Admitting: Physician Assistant

## 2016-09-06 ENCOUNTER — Telehealth: Payer: Self-pay

## 2016-09-06 LAB — PLEASE NOTE

## 2016-09-06 LAB — URINE CULTURE

## 2016-09-06 NOTE — Telephone Encounter (Signed)
-----   Message from Trinna Post, [place] sent at 09/06/2016  4:42 PM EST ----- UTI sensitive to Bactrim.

## 2016-09-06 NOTE — Telephone Encounter (Signed)
Results are no (no sensitivity report?). Can you review this? Thanks. Renaldo Fiddler, CMA

## 2016-09-06 NOTE — Telephone Encounter (Signed)
Pt stated she had a urine culture done on 09/04/16 ordered by Adriana and she wanted to see if the results were back. Please advise. Thanks TNP

## 2016-09-06 NOTE — Telephone Encounter (Signed)
Pt advised. Luane Rochon Drozdowski, CMA  

## 2016-09-06 NOTE — Telephone Encounter (Signed)
Advised pt of lab results. Pt verbally acknowledges understanding. Libni Fusaro Drozdowski, CMA   

## 2016-09-06 NOTE — Telephone Encounter (Signed)
Correct, showing Klebsiella but no sensitivities. Probably waiting for it to grow in lab still. Will contact when this is back.

## 2016-09-07 ENCOUNTER — Telehealth: Payer: Self-pay | Admitting: Physician Assistant

## 2016-09-07 DIAGNOSIS — N3090 Cystitis, unspecified without hematuria: Secondary | ICD-10-CM

## 2016-09-07 MED ORDER — SULFAMETHOXAZOLE-TRIMETHOPRIM 800-160 MG PO TABS: 1.0000 | ORAL_TABLET | Freq: Two times a day (BID) | ORAL | 0 refills | Status: AC

## 2016-09-07 NOTE — Telephone Encounter (Signed)
Pt was treated for klebsiella infection on 09/04/2016. Was treated with Bactrim, which is appropriate (see cx). Pt finished the abx today. She is not c/o dysuria, hematuria. States her sx are improving, and she is increasing water intake. Pt mainly concerned about the cloudy urine. Please advise as Fabio Bering is not in the office today. Renaldo Fiddler, CMA

## 2016-09-07 NOTE — Telephone Encounter (Signed)
Pt advised. Emily Drozdowski, CMA  

## 2016-09-07 NOTE — Telephone Encounter (Signed)
Pt was in for UTI and saying her urine is still cloud.  Please advise.  [phone]  Thank sTeri

## 2016-09-07 NOTE — Telephone Encounter (Signed)
Will extend for 4 more days to total 7 day treatment since she has a h/o recurrent infections.

## 2016-09-10 ENCOUNTER — Telehealth: Payer: Self-pay | Admitting: Physician Assistant

## 2016-09-10 NOTE — Telephone Encounter (Signed)
Pt called saying she was in last week for a UTI and has been taking a medication that Adriana prescribed.  She said over the weekend she started itching, no rash or swelling and wondered if it was the medication so she started taking the 1/2 dose.    She wants to know if she should continue this medication or not  Please advise.  Her call back is [phone]  Thanks, Con Memos

## 2016-09-10 NOTE — Telephone Encounter (Signed)
Patient has been advised. KW 

## 2016-09-10 NOTE — Telephone Encounter (Signed)
She can stop taking the medication, thank you.

## 2016-09-10 NOTE — Telephone Encounter (Signed)
See phone message on 09/07/16. Anderson [place] had extended 4 more days of antibiotic totaling 7 day treatment. I dont think itching is related to antibiotic, since they did not appear till days later after medication was initially prescribed. Please review chart and advise. KW

## 2016-09-19 ENCOUNTER — Encounter: Payer: Self-pay | Admitting: Physician Assistant

## 2016-09-19 ENCOUNTER — Ambulatory Visit (INDEPENDENT_AMBULATORY_CARE_PROVIDER_SITE_OTHER): Payer: Medicare Other | Admitting: Physician Assistant

## 2016-09-19 VITALS — BP 126/80 | HR 72 | Temp 97.7°F | Resp 16 | Wt 189.0 lb

## 2016-09-19 DIAGNOSIS — N3090 Cystitis, unspecified without hematuria: Secondary | ICD-10-CM | POA: Diagnosis not present

## 2016-09-19 LAB — POCT URINALYSIS DIPSTICK
Bilirubin, UA: NEGATIVE
Glucose, UA: NEGATIVE
Ketones, UA: NEGATIVE
Nitrite, UA: NEGATIVE
Spec Grav, UA: 1.02
Urobilinogen, UA: 0.2
pH, UA: 6

## 2016-09-19 MED ORDER — DOXYCYCLINE HYCLATE 100 MG PO TABS: 100.0000 mg | ORAL_TABLET | Freq: Two times a day (BID) | ORAL | 0 refills | Status: AC

## 2016-09-19 NOTE — Patient Instructions (Signed)

## 2016-09-19 NOTE — Progress Notes (Signed)
[facility]  Chief Complaint  Patient presents with  . Urinary Tract Infection    Started about three days ago.     Subjective:    Patient ID: Tina [name], female    DOB: 05/07/1933, 83 y.o.   MRN: [id number]   Urinary Tract Infection: Patient complains of frequency She has had symptoms for 3 days. Patient also complains of back pain. Patient denies stomach ache and vaginal discharge. Patient does not have a history of recurrent UTI.  Patient does have a history of pyelonephritis or other renal issues. Patient denies vaginal discharge and denies new sexual partners. The patient denies recent travel outside of the [place].  Patient does have urinary leakage and uses pad which she changes 2-3 times per day. Has colostomy bag.   Review of Systems  Constitutional: Negative.   Gastrointestinal: Positive for heartburn. Negative for abdominal pain, blood in stool, constipation, diarrhea, melena, nausea and vomiting.  Genitourinary: Positive for frequency. Negative for dysuria, flank pain, hematuria and urgency.  Musculoskeletal: Positive for back pain (Left sided back pain). Negative for falls, joint pain, myalgias and neck pain.  Neurological: Negative for dizziness and headaches.       Objective:   BP 126/80 (BP Location: Right Arm, Patient Position: Sitting, Cuff Size: Normal)   Pulse 72   Temp 97.7 F (36.5 C) (Oral)   Resp 16   Wt 189 lb (85.7 kg)   BMI 32.44 kg/m   Patient Active Problem List   Diagnosis Date Noted  . Abnormal kidney function study 06/08/2015  . Encounter for attention to colostomy ([place]) 06/08/2015  . Clinical depression 06/08/2015  . Calcium blood increased 06/08/2015  . Blood glucose elevated 06/08/2015  . BP (high blood pressure) 06/08/2015  . Cannot sleep 06/08/2015  . Calculus of kidney 06/08/2015  . LBP (low back pain) 06/08/2015  . Adenopathy 06/08/2015  . Venous lake 06/08/2015  . Adynamia  06/08/2015  . Polymyalgia rheumatica ([place]) 05/27/2013  . Coxitis 03/11/2013  . Difficulty hearing 07/08/2009  . Accumulation of fluid in tissues 02/14/2009  . Menopausal and perimenopausal disorder 02/14/2009  . Excessive urination at night 02/14/2009  . Anarthritic rheumatoid disease ([place]) 02/14/2009  . Colon, diverticulosis 08/19/2003  . Diaphragmatic hernia 08/19/2003  . Female genuine stress incontinence 08/19/2002  . Arthritis, degenerative 01/20/2001  . HLD (hyperlipidemia) 02/21/1998  . Adiposity 12/21/1994    Outpatient Encounter Prescriptions as of 09/19/2016  Medication Sig  . lisinopril (PRINIVIL,ZESTRIL) 20 MG tablet Take 1 tablet (20 mg total) by mouth daily.  . metoprolol (LOPRESSOR) 50 MG tablet Take 1 tablet (50 mg total) by mouth 2 (two) times daily.  . predniSONE (DELTASONE) 5 MG tablet TAKE 1 TABLET DAILY  . doxycycline (VIBRA-TABS) 100 MG tablet Take 1 tablet (100 mg total) by mouth 2 (two) times daily.   No facility-administered encounter medications on file as of 09/19/2016.     Allergies  Allergen Reactions  . Erythromycin        Physical Exam  Constitutional: She is oriented to person, place, and time. She appears well-developed and well-nourished. No distress.  Cardiovascular: Normal rate and regular rhythm.   Pulmonary/Chest: Effort normal and breath sounds normal.  Abdominal: Soft. Bowel sounds are normal. She exhibits no distension. There is tenderness in the suprapubic area. There is no rebound, no guarding and no CVA tenderness.  Neurological: She is alert and oriented to person, place, and time.  Skin: Skin is warm and dry.  She is not diaphoretic.  Psychiatric: She has a normal mood and affect. Her behavior is normal.       Assessment & Plan:   1. Cystitis  Second UTI in 2 weeks, last cx grew Klebsiella susceptible to Bactrim, which patient took at least three days. She reports she was cutting Bactrim in half for the last two days after it  had been extended four more days because she was constipated and itching. Advised patient to change pad frequently, use good hand hygiene. One more UTI and will refer to urology for recurrent UTIs.  - doxycycline (VIBRA-TABS) 100 MG tablet; Take 1 tablet (100 mg total) by mouth 2 (two) times daily.  Dispense: 14 tablet; Refill: 0 - Urine Culture - POCT urinalysis dipstick  Return if symptoms worsen or fail to improve.  The entirety of the information documented in the History of Present Illness, Review of Systems and Physical Exam were personally obtained by me. Portions of this information were initially documented by Ashley Royalty, CMA and reviewed by me for thoroughness and accuracy.       Patient Instructions  Urinary Tract Infection, Adult A urinary tract infection (UTI) is an infection of any part of the urinary tract, which includes the kidneys, ureters, bladder, and urethra. These organs make, store, and get rid of urine in the body. UTI can be a bladder infection (cystitis) or kidney infection (pyelonephritis). What are the causes? This infection may be caused by fungi, viruses, or bacteria. Bacteria are the most common cause of UTIs. This condition can also be caused by repeated incomplete emptying of the bladder during urination. What increases the risk? This condition is more likely to develop if:  You ignore your need to urinate or hold urine for long periods of time.  You do not empty your bladder completely during urination.  You wipe back to front after urinating or having a bowel movement, if you are female.  You are uncircumcised, if you are female.  You are constipated.  You have a urinary catheter that stays in place (indwelling).  You have a weak defense (immune) system.  You have a medical condition that affects your bowels, kidneys, or bladder.  You have diabetes.  You take antibiotic medicines frequently or for long periods of time, and the antibiotics no  longer work well against certain types of infections (antibiotic resistance).  You take medicines that irritate your urinary tract.  You are exposed to chemicals that irritate your urinary tract.  You are female. What are the signs or symptoms? Symptoms of this condition include:  Fever.  Frequent urination or passing small amounts of urine frequently.  Needing to urinate urgently.  Pain or burning with urination.  Urine that smells bad or unusual.  Cloudy urine.  Pain in the lower abdomen or back.  Trouble urinating.  Blood in the urine.  Vomiting or being less hungry than normal.  Diarrhea or abdominal pain.  Vaginal discharge, if you are female. How is this diagnosed? This condition is diagnosed with a medical history and physical exam. You will also need to provide a urine sample to test your urine. Other tests may be done, including:  Blood tests.  Sexually transmitted disease (STD) testing. If you have had more than one UTI, a cystoscopy or imaging studies may be done to determine the cause of the infections. How is this treated? Treatment for this condition often includes a combination of two or more of the following:  Antibiotic medicine.  Other medicines to treat less common causes of UTI.  Over-the-counter medicines to treat pain.  Drinking enough water to stay hydrated. Follow these instructions at home:  Take over-the-counter and prescription medicines only as told by your health care provider.  If you were prescribed an antibiotic, take it as told by your health care provider. Do not stop taking the antibiotic even if you start to feel better.  Avoid alcohol, caffeine, tea, and carbonated beverages. They can irritate your bladder.  Drink enough fluid to keep your urine clear or pale yellow.  Keep all follow-up visits as told by your health care provider. This is important.  Make sure to:  Empty your bladder often and completely. Do not hold  urine for long periods of time.  Empty your bladder before and after sex.  Wipe from front to back after a bowel movement if you are female. Use each tissue one time when you wipe. Contact a health care provider if:  You have back pain.  You have a fever.  You feel nauseous or vomit.  Your symptoms do not get better after 3 days.  Your symptoms go away and then return. Get help right away if:  You have severe back pain or lower abdominal pain.  You are vomiting and cannot keep down any medicines or water. This information is not intended to replace advice given to you by your health care provider. Make sure you discuss any questions you have with your health care provider. Document Released: 04/25/2005 Document Revised: 12/28/2015 Document Reviewed: 06/06/2015 Elsevier Interactive Patient Education  2017 [employer].

## 2016-09-24 ENCOUNTER — Telehealth: Payer: Self-pay

## 2016-09-24 LAB — PLEASE NOTE

## 2016-09-24 LAB — URINE CULTURE

## 2016-09-24 NOTE — Telephone Encounter (Signed)
Advised pt of lab results. Pt verbally acknowledges understanding. Miklo Aken Drozdowski, CMA   

## 2016-09-24 NOTE — Telephone Encounter (Signed)
-----   Message from Trinna Post, [place] sent at 09/24/2016  1:20 PM EST ----- Urine cx grew Klebsiella. Sensitive to doxycycline. Hopefully patient is feeling better, has 7 days of bx to complete.

## 2016-10-06 ENCOUNTER — Other Ambulatory Visit: Payer: Self-pay | Admitting: Physician Assistant

## 2016-10-06 DIAGNOSIS — I10 Essential (primary) hypertension: Secondary | ICD-10-CM

## 2016-10-23 ENCOUNTER — Other Ambulatory Visit: Payer: Self-pay | Admitting: Physician Assistant

## 2016-10-23 DIAGNOSIS — I10 Essential (primary) hypertension: Secondary | ICD-10-CM

## 2016-10-23 NOTE — Telephone Encounter (Signed)
Last ov 09/19/16 Last filled 03/15/16 Please review. Thank you. sd

## 2016-12-13 ENCOUNTER — Other Ambulatory Visit: Payer: Self-pay | Admitting: Physician Assistant

## 2016-12-13 NOTE — Telephone Encounter (Signed)
Last ov 09/19/16 Last filled  Please review. Thank you. sd

## 2017-01-08 DIAGNOSIS — H2513 Age-related nuclear cataract, bilateral: Secondary | ICD-10-CM | POA: Diagnosis not present

## 2017-03-14 DIAGNOSIS — H01019 Ulcerative blepharitis unspecified eye, unspecified eyelid: Secondary | ICD-10-CM | POA: Diagnosis not present

## 2017-03-18 DIAGNOSIS — H01019 Ulcerative blepharitis unspecified eye, unspecified eyelid: Secondary | ICD-10-CM | POA: Diagnosis not present

## 2017-04-06 ENCOUNTER — Other Ambulatory Visit: Payer: Self-pay | Admitting: Physician Assistant

## 2017-04-06 DIAGNOSIS — I10 Essential (primary) hypertension: Secondary | ICD-10-CM

## 2017-04-21 ENCOUNTER — Other Ambulatory Visit: Payer: Self-pay | Admitting: Physician Assistant

## 2017-04-21 DIAGNOSIS — I10 Essential (primary) hypertension: Secondary | ICD-10-CM

## 2017-05-03 DIAGNOSIS — Z23 Encounter for immunization: Secondary | ICD-10-CM | POA: Diagnosis not present

## 2017-05-16 ENCOUNTER — Encounter: Payer: Self-pay | Admitting: Physician Assistant

## 2017-05-16 ENCOUNTER — Ambulatory Visit (INDEPENDENT_AMBULATORY_CARE_PROVIDER_SITE_OTHER): Payer: Medicare Other | Admitting: Physician Assistant

## 2017-05-16 VITALS — BP 122/68 | HR 80 | Temp 97.5°F | Resp 16 | Wt 194.0 lb

## 2017-05-16 DIAGNOSIS — R21 Rash and other nonspecific skin eruption: Secondary | ICD-10-CM

## 2017-05-16 MED ORDER — TRIAMCINOLONE ACETONIDE 0.1 % EX CREA: 1.0000 "application " | TOPICAL_CREAM | Freq: Two times a day (BID) | CUTANEOUS | 0 refills | Status: DC

## 2017-05-16 NOTE — Patient Instructions (Signed)
We are giving you a steroid cream called Triamcinelone cream. If this makes your rash worse, then please try an over the counter cream like Lamisil or Lotrimin.     Contact Dermatitis Dermatitis is redness, soreness, and swelling (inflammation) of the skin. Contact dermatitis is a reaction to certain substances that touch the skin. You either touched something that irritated your skin, or you have allergies to something you touched. Follow these instructions at home: [place] your skin as needed.  Apply cool compresses to the affected areas.  Try taking a bath with: ? Epsom salts. Follow the instructions on the package. You can get these at a pharmacy or grocery store. ? Baking soda. Pour a small amount into the bath as told by your doctor. ? Colloidal oatmeal. Follow the instructions on the package. You can get this at a pharmacy or grocery store.  Try applying baking soda paste to your skin. Stir water into baking soda until it looks like paste.  Do not scratch your skin.  Bathe less often.  Bathe in lukewarm water. Avoid using hot water. Medicines  Take or apply over-the-counter and prescription medicines only as told by your doctor.  If you were prescribed an antibiotic medicine, take or apply your antibiotic as told by your doctor. Do not stop taking the antibiotic even if your condition starts to get better. General instructions  Keep all follow-up visits as told by your doctor. This is important.  Avoid the substance that caused your reaction. If you do not know what caused it, keep a journal to try to track what caused it. Write down: ? What you eat. ? What cosmetic products you use. ? What you drink. ? What you wear in the affected area. This includes jewelry.  If you were given a bandage (dressing), take care of it as told by your doctor. This includes when to change and remove it. Contact a doctor if:  You do not get better with treatment.  Your  condition gets worse.  You have signs of infection such as: ? Swelling. ? Tenderness. ? Redness. ? Soreness. ? Warmth.  You have a fever.  You have new symptoms. Get help right away if:  You have a very bad headache.  You have neck pain.  Your neck is stiff.  You throw up (vomit).  You feel very sleepy.  You see red streaks coming from the affected area.  Your bone or joint underneath the affected area becomes painful after the skin has healed.  The affected area turns darker.  You have trouble breathing. This information is not intended to replace advice given to you by your health care provider. Make sure you discuss any questions you have with your health care provider. Document Released: 05/13/2009 Document Revised: 12/22/2015 Document Reviewed: 12/01/2014 Elsevier Interactive Patient Education  2018 [employer].

## 2017-05-16 NOTE — Progress Notes (Signed)
Patient: Tina [name] Female    DOB: 06-07-1933   83 y.o.   MRN: [id number] Visit Date: 05/17/2017  Today's Provider: Trinna Post, PA-C   Chief Complaint  Patient presents with  . Skin Discoloration   Subjective:    HPI  Pt is here for a skin lesion on her chin. She reports that she noticed a red spot on her face about a week ago. She reports that she sometimes gets thick stray hairs on her chin and she get them out with tweezers. She is uncertain that this is what caused this because she can not remember which side she pulled on out of recently. She reports that she has used several OTC creams and a nystatin cream that she had been previously prescribed. None of these have helped. Pt reports that the area does not itch or painful or burning. The area is about the size of a dime and is red. She is not sure if it started suddenly or gradually got worse. No pets, does not garden.     Allergies  Allergen Reactions  . Erythromycin      Current Outpatient Prescriptions:  .  lisinopril (PRINIVIL,ZESTRIL) 20 MG tablet, TAKE 1 TABLET DAILY, Disp: 90 tablet, Rfl: 1 .  metoprolol tartrate (LOPRESSOR) 50 MG tablet, TAKE 1 TABLET TWICE A DAY, Disp: 180 tablet, Rfl: 1 .  predniSONE (DELTASONE) 5 MG tablet, TAKE 1 TABLET DAILY, Disp: 90 tablet, Rfl: 2 .  triamcinolone cream (KENALOG) 0.1 %, Apply 1 application topically 2 (two) times daily., Disp: 30 g, Rfl: 0  Review of Systems  Constitutional: Negative.   HENT: Negative.   Eyes: Negative.   Respiratory: Negative.   Cardiovascular: Negative.   Gastrointestinal: Negative.   Endocrine: Negative.   Genitourinary: Negative.   Musculoskeletal: Negative.   Skin: Positive for color change.  Allergic/Immunologic: Negative.   Neurological: Negative.   Hematological: Negative.   Psychiatric/Behavioral: Negative.     Social History  Substance Use Topics  . Smoking status: Former Smoker    Quit date: 07/30/1979  .  Smokeless tobacco: Never Used  . Alcohol use No   Objective:   BP 122/68 (BP Location: Left Arm, Patient Position: Sitting, Cuff Size: Large)   Pulse 80   Temp (!) 97.5 F (36.4 C) (Oral)   Resp 16   Wt 194 lb (88 kg)   BMI 33.30 kg/m  Vitals:   05/16/17 1453  BP: 122/68  Pulse: 80  Resp: 16  Temp: (!) 97.5 F (36.4 C)  TempSrc: Oral  Weight: 194 lb (88 kg)     Physical Exam  Constitutional: She is oriented to person, place, and time. She appears well-developed and well-nourished.  Neurological: She is alert and oriented to person, place, and time.  Skin: Skin is warm and dry. There is erythema.  1 cm well circumscribe circular erythematous lesions on left side of chin. Some evidence of flaking. Some slight central clearing, but no raised borders.  Psychiatric: She has a normal mood and affect. Her behavior is normal.        Assessment & Plan:     1. Rash  Will try cream as below for eczematous like lesion. May be tinea, if worsening with steroid cream, should try OTC Lamisil or lotrimin x 2 weeks.  - triamcinolone cream (KENALOG) 0.1 %; Apply 1 application topically 2 (two) times daily.  Dispense: 30 g; Refill: 0  Return if symptoms worsen or fail to  improve.  The entirety of the information documented in the History of Present Illness, Review of Systems and Physical Exam were personally obtained by me. Portions of this information were initially documented by [place], [place] and reviewed by me for thoroughness and accuracy.            Trinna Post, PA-C  [facility]

## 2017-08-14 ENCOUNTER — Encounter: Payer: Medicare Other | Admitting: Family Medicine

## 2017-09-10 ENCOUNTER — Other Ambulatory Visit: Payer: Self-pay | Admitting: Physician Assistant

## 2017-10-03 ENCOUNTER — Other Ambulatory Visit: Payer: Self-pay | Admitting: Physician Assistant

## 2017-10-03 DIAGNOSIS — I10 Essential (primary) hypertension: Secondary | ICD-10-CM

## 2017-10-07 ENCOUNTER — Ambulatory Visit (INDEPENDENT_AMBULATORY_CARE_PROVIDER_SITE_OTHER): Payer: Medicare Other

## 2017-10-07 VITALS — BP 128/70 | HR 72 | Temp 97.4°F | Ht 64.0 in

## 2017-10-07 DIAGNOSIS — Z0000 Encounter for general adult medical examination without abnormal findings: Secondary | ICD-10-CM

## 2017-10-07 NOTE — Progress Notes (Signed)
Subjective:   Tina [name] is a 84 y.o. female who presents for Medicare Annual (Subsequent) preventive examination.  Review of Systems:  N/A Cardiac Risk Factors include: advanced age (>71men, >61 women);dyslipidemia;hypertension;obesity (BMI >30kg/m2)     Objective:     Vitals: BP 128/70 (BP Location: Left Arm)   Pulse 72   Temp (!) 97.4 F (36.3 C) (Oral)   Ht 5\' 4"  (1.626 m)   BMI 33.30 kg/m   Body mass index is 33.3 kg/m.  Advanced Directives 10/07/2017 10/13/2015 10/09/2015 06/08/2015  Does Patient Have a Medical Advance Directive? Yes No No Yes  Type of Advance Directive [place];Living will  Copy of [place] in Chart? No - copy requested - - -  Would patient like information on creating a medical advance directive? - - No - patient declined information -    Tobacco Social History   Tobacco Use  Smoking Status Former Smoker  . Last attempt to quit: 07/30/1979  . Years since quitting: 38.2  Smokeless Tobacco Never Used     Counseling given: Not Answered   Clinical Intake:  Pre-visit preparation completed: Yes  Pain : No/denies pain Pain Score: 0-No pain     Nutritional Status: BMI > 30  Obese Nutritional Risks: None Diabetes: No  How often do you need to have someone help you when you read instructions, pamphlets, or other written materials from your doctor or pharmacy?: 1 - Never  Interpreter Needed?: No  Information entered by :: [facility], LPN  History reviewed. No pertinent past medical history. Past Surgical History:  Procedure Laterality Date  . ABDOMINAL HYSTERECTOMY  1980's  . COLON SURGERY  06/08/13   s/p ruptured diverticulitis, has fistula; had TPN for 3 mths  . DILATION AND CURETTAGE OF UTERUS  1970's  . HIP SURGERY Right 06/01/2013  . KNEE SURGERY Left 2004   total knee replacement    Family History  Problem Relation Age of Onset  . Cancer Mother        breast   Social History   Socioeconomic History  . Marital status: Single    Spouse name: None  . Number of children: 1  . Years of education: None  . Highest education level: Master's degree (e.g., MA, MS, MEng, MEd, MSW, MBA)  Social Needs  . Financial resource strain: Not hard at all  . Food insecurity - worry: Never true  . Food insecurity - inability: Never true  . Transportation needs - medical: No  . Transportation needs - non-medical: No  Occupational History  . Occupation: retired  Tobacco Use  . Smoking status: Former Smoker    Last attempt to quit: 07/30/1979    Years since quitting: 38.2  . Smokeless tobacco: Never Used  Substance and Sexual Activity  . Alcohol use: No    Comment: 0-1 scotch drink(s) a month  . Drug use: No  . Sexual activity: None  Other Topics Concern  . None  Social History Narrative  . None    Outpatient Encounter Medications as of 10/07/2017  Medication Sig  . amoxicillin (AMOXIL) 500 MG tablet Take 500 mg by mouth.  . Ibuprofen-diphenhydrAMINE HCl (ADVIL PM) 200-25 MG CAPS Take 2 tablets by mouth at bedtime.  Marland Kitchen lisinopril (PRINIVIL,ZESTRIL) 20 MG tablet TAKE 1 TABLET DAILY  . metoprolol tartrate (LOPRESSOR) 50 MG tablet TAKE 1 TABLET TWICE A DAY  . predniSONE (DELTASONE) 5 MG tablet TAKE 1 TABLET  DAILY  . triamcinolone cream (KENALOG) 0.1 % Apply 1 application topically 2 (two) times daily.   No facility-administered encounter medications on file as of 10/07/2017.     Activities of Daily Living In your present state of health, do you have any difficulty performing the following activities: 10/07/2017  Hearing? Y  Comment Wears bilateral hearing aids.  Vision? N  Difficulty concentrating or making decisions? N  Walking or climbing stairs? N  Dressing or bathing? N  Doing errands, shopping? N  Preparing Food and eating ? N  Using the Toilet? N  In the past six months, have you accidently leaked urine? Y  Comment Wears  protection.  Do you have problems with loss of bowel control? N  Comment Has a colostomy bag.  Managing your Medications? N  Managing your Finances? N  Housekeeping or managing your Housekeeping? N  Some recent data might be hidden    Patient Care Team: Virginia Crews, MD as PCP - General (Family Medicine) Dingeldein, Remo Lipps, MD as Consulting Physician (Ophthalmology)    Assessment:   This is a routine wellness examination for [place].  Exercise Activities and Dietary recommendations Current Exercise Habits: The patient does not participate in regular exercise at present, Exercise limited by: None identified  Goals    . DIET - REDUCE CALORIE INTAKE     Recommend to continue current duet regimen of not eating fried foods or desserts, and focusing on smaller portions.     . Exercise 150 minutes per week (moderate activity)       Fall Risk Fall Risk  10/07/2017 08/01/2016 06/08/2015  Falls in the past year? No No No   Is the patient's home free of loose throw rugs in walkways, pet beds, electrical cords, etc?   yes      Grab bars in the bathroom? yes      Handrails on the stairs?   yes      Adequate lighting?   yes  Timed Get Up and Go performed: N/A  Depression Screen PHQ 2/9 Scores 10/07/2017 08/01/2016 06/08/2015  PHQ - 2 Score 0 0 0     Cognitive Function: Pt declined screening today.         Immunization History  Administered Date(s) Administered  . Influenza Split 05/13/2012  . Pneumococcal Conjugate-13 06/08/2015  . Pneumococcal Polysaccharide-23 08/01/2016  . Td 02/09/2008    Qualifies for Shingles Vaccine? Pt declines today.   Screening Tests Health Maintenance  Topic Date Due  . INFLUENZA VACCINE  05/14/2018 (Originally 02/27/2017)  . TETANUS/TDAP  02/08/2018  . DEXA SCAN  Completed  . PNA vac Low Risk Adult  Completed    Cancer Screenings: Lung: Low Dose CT Chest recommended if Age 12-80 years, 30 pack-year currently smoking OR have quit w/in  15years. Patient does not qualify. Breast:  Up to date on Mammogram? Yes   Up to date of Bone Density/Dexa? Yes Colorectal: N/A  Additional Screenings:  Hepatitis C Screening: N/A     Plan:  I have personally reviewed and addressed the Medicare Annual Wellness questionnaire and have noted the following in the patient's chart:  A. Medical and social history B. Use of alcohol, tobacco or illicit drugs  C. Current medications and supplements D. Functional ability and status E.  Nutritional status F.  Physical activity G. Advance directives H. List of other physicians I.  Hospitalizations, surgeries, and ER visits in previous 12 months J.  [place] such as hearing and vision if  needed, cognitive and depression L. Referrals and appointments - none  In addition, I have reviewed and discussed with patient certain preventive protocols, quality metrics, and best practice recommendations. A written personalized care plan for preventive services as well as general preventive health recommendations were provided to patient.  See attached scanned questionnaire for additional information.   Signed,  Fabio Neighbors, LPN Nurse Health Advisor   Nurse Recommendations: None.

## 2017-10-07 NOTE — Patient Instructions (Signed)
Tina [name] , Thank you for taking time to come for your Medicare Wellness Visit. I appreciate your ongoing commitment to your health goals. Please review the following plan we discussed and let me know if I can assist you in the future.   Screening recommendations/referrals: Colonoscopy: N/A Mammogram: Up to date Bone Density: Up to date Recommended yearly ophthalmology/optometry visit for glaucoma screening and checkup Recommended yearly dental visit for hygiene and checkup  Vaccinations: Influenza vaccine: Up to date Pneumococcal vaccine: Up to date Tdap vaccine: Up to date Shingles vaccine: Pt declines today.     Advanced directives: Please bring a copy of your POA (Power of Attorney) and/or Living Will to your next appointment.   Conditions/risks identified: Obesity- recommend to continue current duet regimen of not eating fried foods or desserts, and focusing on smaller portions.   Next appointment: 10/09/17 @ 2:00 PM with Dr Brita Romp.   Preventive Care 1 Years and Older, Female Preventive care refers to lifestyle choices and visits with your health care provider that can promote health and wellness. What does preventive care include?  A yearly physical exam. This is also called an annual well check.  Dental exams once or twice a year.  Routine eye exams. Ask your health care provider how often you should have your eyes checked.  Personal lifestyle choices, including:  Daily care of your teeth and gums.  Regular physical activity.  Eating a healthy diet.  Avoiding tobacco and drug use.  Limiting alcohol use.  Practicing safe sex.  Taking low-dose aspirin every day.  Taking vitamin and mineral supplements as recommended by your health care provider. What happens during an annual well check? The services and screenings done by your health care provider during your annual well check will depend on your age, overall health, lifestyle risk factors, and family  history of disease. Counseling  Your health care provider may ask you questions about your:  Alcohol use.  Tobacco use.  Drug use.  Emotional well-being.  Home and relationship well-being.  Sexual activity.  Eating habits.  History of falls.  Memory and ability to understand (cognition).  Work and work Statistician.  Reproductive health. Screening  You may have the following tests or measurements:  Height, weight, and BMI.  Blood pressure.  Lipid and cholesterol levels. These may be checked every 5 years, or more frequently if you are over 60 years old.  Skin check.  Lung cancer screening. You may have this screening every year starting at age 35 if you have a 30-pack-year history of smoking and currently smoke or have quit within the past 15 years.  Fecal occult blood test (FOBT) of the stool. You may have this test every year starting at age 29.  Flexible sigmoidoscopy or colonoscopy. You may have a sigmoidoscopy every 5 years or a colonoscopy every 10 years starting at age 65.  Hepatitis C blood test.  Hepatitis B blood test.  Sexually transmitted disease (STD) testing.  Diabetes screening. This is done by checking your blood sugar (glucose) after you have not eaten for a while (fasting). You may have this done every 1-3 years.  Bone density scan. This is done to screen for osteoporosis. You may have this done starting at age 86.  Mammogram. This may be done every 1-2 years. Talk to your health care provider about how often you should have regular mammograms. Talk with your health care provider about your test results, treatment options, and if necessary, the need for more tests.  Vaccines  Your health care provider may recommend certain vaccines, such as:  Influenza vaccine. This is recommended every year.  Tetanus, diphtheria, and acellular pertussis (Tdap, Td) vaccine. You may need a Td booster every 10 years.  Zoster vaccine. You may need this after  age 23.  Pneumococcal 13-valent conjugate (PCV13) vaccine. One dose is recommended after age 39.  Pneumococcal polysaccharide (PPSV23) vaccine. One dose is recommended after age 33. Talk to your health care provider about which screenings and vaccines you need and how often you need them. This information is not intended to replace advice given to you by your health care provider. Make sure you discuss any questions you have with your health care provider. Document Released: 08/12/2015 Document Revised: 04/04/2016 Document Reviewed: 05/17/2015 Elsevier Interactive Patient Education  2017 [place] Prevention in the Home Falls can cause injuries. They can happen to people of all ages. There are many things you can do to make your home safe and to help prevent falls. What can I do on the outside of my home?  Regularly fix the edges of walkways and driveways and fix any cracks.  Remove anything that might make you trip as you walk through a door, such as a raised step or threshold.  Trim any bushes or trees on the path to your home.  Use bright outdoor lighting.  Clear any walking paths of anything that might make someone trip, such as rocks or tools.  Regularly check to see if handrails are loose or broken. Make sure that both sides of any steps have handrails.  Any raised decks and porches should have guardrails on the edges.  Have any leaves, snow, or ice cleared regularly.  Use sand or salt on walking paths during winter.  Clean up any spills in your garage right away. This includes oil or grease spills. What can I do in the bathroom?  Use night lights.  Install grab bars by the toilet and in the tub and shower. Do not use towel bars as grab bars.  Use non-skid mats or decals in the tub or shower.  If you need to sit down in the shower, use a plastic, non-slip stool.  Keep the floor dry. Clean up any water that spills on the floor as soon as it  happens.  Remove soap buildup in the tub or shower regularly.  Attach bath mats securely with double-sided non-slip rug tape.  Do not have throw rugs and other things on the floor that can make you trip. What can I do in the bedroom?  Use night lights.  Make sure that you have a light by your bed that is easy to reach.  Do not use any sheets or blankets that are too big for your bed. They should not hang down onto the floor.  Have a firm chair that has side arms. You can use this for support while you get dressed.  Do not have throw rugs and other things on the floor that can make you trip. What can I do in the kitchen?  Clean up any spills right away.  Avoid walking on wet floors.  Keep items that you use a lot in easy-to-reach places.  If you need to reach something above you, use a strong step stool that has a grab bar.  Keep electrical cords out of the way.  Do not use floor polish or wax that makes floors slippery. If you must use wax, use non-skid floor wax.  Do not have throw rugs and other things on the floor that can make you trip. What can I do with my stairs?  Do not leave any items on the stairs.  Make sure that there are handrails on both sides of the stairs and use them. Fix handrails that are broken or loose. Make sure that handrails are as long as the stairways.  Check any carpeting to make sure that it is firmly attached to the stairs. Fix any carpet that is loose or worn.  Avoid having throw rugs at the top or bottom of the stairs. If you do have throw rugs, attach them to the floor with carpet tape.  Make sure that you have a light switch at the top of the stairs and the bottom of the stairs. If you do not have them, ask someone to add them for you. What else can I do to help prevent falls?  Wear shoes that:  Do not have high heels.  Have rubber bottoms.  Are comfortable and fit you well.  Are closed at the toe. Do not wear sandals.  If you  use a stepladder:  Make sure that it is fully opened. Do not climb a closed stepladder.  Make sure that both sides of the stepladder are locked into place.  Ask someone to hold it for you, if possible.  Clearly mark and make sure that you can see:  Any grab bars or handrails.  First and last steps.  Where the edge of each step is.  Use tools that help you move around (mobility aids) if they are needed. These include:  Canes.  Walkers.  Scooters.  Crutches.  Turn on the lights when you go into a dark area. Replace any light bulbs as soon as they burn out.  Set up your furniture so you have a clear path. Avoid moving your furniture around.  If any of your floors are uneven, fix them.  If there are any pets around you, be aware of where they are.  Review your medicines with your doctor. Some medicines can make you feel dizzy. This can increase your chance of falling. Ask your doctor what other things that you can do to help prevent falls. This information is not intended to replace advice given to you by your health care provider. Make sure you discuss any questions you have with your health care provider. Document Released: 05/12/2009 Document Revised: 12/22/2015 Document Reviewed: 08/20/2014 Elsevier Interactive Patient Education  2017 [employer].

## 2017-10-09 ENCOUNTER — Encounter: Payer: Medicare Other | Admitting: Family Medicine

## 2017-10-19 ENCOUNTER — Other Ambulatory Visit: Payer: Self-pay | Admitting: Physician Assistant

## 2017-10-19 DIAGNOSIS — I10 Essential (primary) hypertension: Secondary | ICD-10-CM

## 2017-10-28 ENCOUNTER — Ambulatory Visit (INDEPENDENT_AMBULATORY_CARE_PROVIDER_SITE_OTHER): Payer: Medicare Other | Admitting: Family Medicine

## 2017-10-28 ENCOUNTER — Encounter: Payer: Self-pay | Admitting: Family Medicine

## 2017-10-28 VITALS — BP 122/78 | HR 68 | Temp 97.4°F | Resp 16 | Ht 64.0 in | Wt 189.0 lb

## 2017-10-28 DIAGNOSIS — I10 Essential (primary) hypertension: Secondary | ICD-10-CM

## 2017-10-28 DIAGNOSIS — G4700 Insomnia, unspecified: Secondary | ICD-10-CM | POA: Diagnosis not present

## 2017-10-28 DIAGNOSIS — Z0000 Encounter for general adult medical examination without abnormal findings: Secondary | ICD-10-CM

## 2017-10-28 DIAGNOSIS — N182 Chronic kidney disease, stage 2 (mild): Secondary | ICD-10-CM

## 2017-10-28 NOTE — Patient Instructions (Signed)
Food Basics for Chronic Kidney Disease When your kidneys are not working well, they cannot remove waste and excess substances from your blood as effectively as they did before. This can lead to a buildup and imbalance of these substances, which can worsen kidney damage and affect how your body functions. Certain foods lead to a buildup of these substances in the body. By changing your diet as recommended by your diet and nutrition specialist (dietitian) or health care provider, you could help prevent further kidney damage and delay or prevent the need for dialysis. What are tips for following this plan? General instructions  Work with your health care provider and dietitian to develop a meal plan that is right for you. Foods you can eat, limit, or avoid will be different for each person depending on the stage of kidney disease and any other existing health conditions.  Talk with your health care provider about whether you should take a vitamin and mineral supplement.  Use standard measuring cups and spoons to measure servings of foods. Use a kitchen scale to measure portions of protein foods.  If directed by your health care provider, avoid drinking too much fluid. Measure and count all liquids, including water, ice, soups, flavored gelatin, and frozen desserts such as popsicles or ice cream. Reading food labels  Check the amount of sodium in foods. Choose foods that have less than 300 milligrams (mg) per serving.  Check the ingredient list for phosphorus or potassium-based additives or preservatives.  Check the amount of saturated and trans fat. Limit or avoid these fats as told by your dietitian. Shopping  Avoid buying foods that are: ? Processed, frozen, or prepackaged. ? Calcium-enriched or fortified.  Do not buy foods that have salt or sodium listed among the first five ingredients.  Do not buy canned vegetables. Cooking  Replace animal proteins, such as meat, fish, eggs, or dairy,  with plant proteins from beans, nuts, and soy. ? Use soy milk instead of cow's milk. ? Add beans or tofu to soups, casseroles, or pasta dishes instead of meat.  Soak vegetables, such as potatoes, before cooking to reduce potassium. To do this: ? Peel and cut into small pieces. ? Soak in warm water for at least 2 hours. For every 1 cup of vegetables, use 10 cups of water. ? Drain and rinse with warm water. ? Boil for at least 5 minutes. Meal planning  Limit the amount of protein from plant and animal sources you eat each day.  Do not add salt to food when cooking or before eating.  Eat meals and snacks at around the same time each day. If you have diabetes:  If you have diabetes (diabetes mellitus) and chronic kidney disease, it is important to keep your blood glucose in the target range recommended by your health care provider. Follow your diabetes management plan. This may include: ? Checking your blood glucose regularly. ? Taking oral medicines, insulin, or both. ? Exercising for at least 30 minutes on 5 or more days each week, or as told by your health care provider. ? Tracking how many servings of carbohydrates you eat at each meal.  You may be given specific guidelines on how much of certain foods and nutrients you may eat, depending on your stage of kidney disease and whether you have high blood pressure (hypertension). Follow your meal plan as told by your dietitian. What nutrients should be limited? The items listed are not a complete list. Talk with your dietitian about  what dietary choices are best for you. Potassium Potassium affects how steadily your heart beats. If too much potassium builds up in your blood, it can cause an irregular heartbeat or even a heart attack. You may need to eat less potassium, depending on your blood potassium levels and the stage of kidney disease. Talk to your dietitian about how much potassium you may have each day. You may need to limit or  avoid foods that are high in potassium, such as:  Milk and soy milk.  Fruits, such as bananas, papaya, apricots, nectarines, melon, prunes, raisins, kiwi, and oranges.  Vegetables, such as potatoes, sweet potatoes, yams, tomatoes, leafy greens, beets, okra, avocado, pumpkin, and winter squash.  White and lima beans.  Phosphorus Phosphorus is a mineral found in your bones. A balance between calcium and phosphorous is needed to build and maintain healthy bones. Too much phosphorus pulls calcium from your bones. This can make your bones weak and more likely to break. Too much phosphorus can also make your skin itch. You may need to eat less phosphorus depending on your blood phosphorus levels and the stage of kidney disease. Talk to your dietitian about how much potassium you may have each day. You may need to take medicine to lower your blood phosphorus levels if diet changes do not help. You may need to limit or avoid foods that are high in phosphorus, such as:  Milk and dairy products.  Dried beans and peas.  Tofu, soy milk, and other soy-based meat replacements.  Colas.  Nuts and peanut butter.  Meat, poultry, and fish.  Bran cereals and oatmeals.  Protein Protein helps you to make and keep muscle. It also helps in the repair of your body's cells and tissues. One of the natural breakdown products of protein is a waste product called urea. When your kidneys are not working properly, they cannot remove wastes, such as urea, like they did before you developed chronic kidney disease. Reducing how much protein you eat can help prevent a buildup of urea in your blood. Depending on your stage of kidney disease, you may need to limit foods that are high in protein. Sources of animal protein include:  Meat (all types).  Fish and seafood.  Poultry.  Eggs.  Dairy.  Other protein foods include:  Beans and legumes.  Nuts and nut butter.  Soy and tofu.  Sodium Sodium, which is  found in salt, helps maintain a healthy balance of fluids in your body. Too much sodium can increase your blood pressure and have a negative effect on the function of your heart and lungs. Too much sodium can also cause your body to retain too much fluid, making your kidneys work harder. Most people should have less than 2,300 milligrams (mg) of sodium each day. If you have hypertension, you may need to limit your sodium to 1,500 mg each day. Talk to your dietitian about how much sodium you may have each day. You may need to limit or avoid foods that are high in sodium, such as:  Salt seasonings.  Soy sauce.  Cured and processed meats.  Salted crackers and snack foods.  Fast food.  Canned soups and most canned foods.  Pickled foods.  Vegetable juice.  Boxed mixes or ready-to-eat boxed meals and side dishes.  Bottled dressings, sauces, and marinades.  Summary  Chronic kidney disease can lead to a buildup and imbalance of waste and excess substances in the body. Certain foods lead to a buildup of  these substances. By adjusting your intake of these foods, you could help prevent more kidney damage and delay or prevent the need for dialysis.  Food adjustments are different for each person with chronic kidney disease. Work with a dietitian to set up nutrient goals and a meal plan that is right for you.  If you have diabetes and chronic kidney disease, it is important to keep your blood glucose in the target range recommended by your health care provider. This information is not intended to replace advice given to you by your health care provider. Make sure you discuss any questions you have with your health care provider. Document Released: 10/06/2002 Document Revised: 07/11/2016 Document Reviewed: 07/11/2016 Elsevier Interactive Patient Education  2018 [place] 65 Years and Older, Female Preventive care refers to lifestyle choices and visits with your health care  provider that can promote health and wellness. What does preventive care include?  A yearly physical exam. This is also called an annual well check.  Dental exams once or twice a year.  Routine eye exams. Ask your health care provider how often you should have your eyes checked.  Personal lifestyle choices, including: ? Daily care of your teeth and gums. ? Regular physical activity. ? Eating a healthy diet. ? Avoiding tobacco and drug use. ? Limiting alcohol use. ? Practicing safe sex. ? Taking low-dose aspirin every day. ? Taking vitamin and mineral supplements as recommended by your health care provider. What happens during an annual well check? The services and screenings done by your health care provider during your annual well check will depend on your age, overall health, lifestyle risk factors, and family history of disease. Counseling Your health care provider may ask you questions about your:  Alcohol use.  Tobacco use.  Drug use.  Emotional well-being.  Home and relationship well-being.  Sexual activity.  Eating habits.  History of falls.  Memory and ability to understand (cognition).  Work and work Statistician.  Reproductive health.  Screening You may have the following tests or measurements:  Height, weight, and BMI.  Blood pressure.  Lipid and cholesterol levels. These may be checked every 5 years, or more frequently if you are over 65 years old.  Skin check.  Lung cancer screening. You may have this screening every year starting at age 64 if you have a 30-pack-year history of smoking and currently smoke or have quit within the past 15 years.  Fecal occult blood test (FOBT) of the stool. You may have this test every year starting at age 68.  Flexible sigmoidoscopy or colonoscopy. You may have a sigmoidoscopy every 5 years or a colonoscopy every 10 years starting at age 61.  Hepatitis C blood test.  Hepatitis B blood test.  Sexually  transmitted disease (STD) testing.  Diabetes screening. This is done by checking your blood sugar (glucose) after you have not eaten for a while (fasting). You may have this done every 1-3 years.  Bone density scan. This is done to screen for osteoporosis. You may have this done starting at age 57.  Mammogram. This may be done every 1-2 years. Talk to your health care provider about how often you should have regular mammograms.  Talk with your health care provider about your test results, treatment options, and if necessary, the need for more tests. Vaccines Your health care provider may recommend certain vaccines, such as:  Influenza vaccine. This is recommended every year.  Tetanus, diphtheria, and acellular pertussis (Tdap, Td) vaccine.  You may need a Td booster every 10 years.  Varicella vaccine. You may need this if you have not been vaccinated.  Zoster vaccine. You may need this after age 5.  Measles, mumps, and rubella (MMR) vaccine. You may need at least one dose of MMR if you were born in 1957 or later. You may also need a second dose.  Pneumococcal 13-valent conjugate (PCV13) vaccine. One dose is recommended after age 1.  Pneumococcal polysaccharide (PPSV23) vaccine. One dose is recommended after age 22.  Meningococcal vaccine. You may need this if you have certain conditions.  Hepatitis A vaccine. You may need this if you have certain conditions or if you travel or work in places where you may be exposed to hepatitis A.  Hepatitis B vaccine. You may need this if you have certain conditions or if you travel or work in places where you may be exposed to hepatitis B.  Haemophilus influenzae type b (Hib) vaccine. You may need this if you have certain conditions.  Talk to your health care provider about which screenings and vaccines you need and how often you need them. This information is not intended to replace advice given to you by your health care provider. Make sure  you discuss any questions you have with your health care provider. Document Released: 08/12/2015 Document Revised: 04/04/2016 Document Reviewed: 05/17/2015 Elsevier Interactive Patient Education  [employer].

## 2017-10-28 NOTE — Assessment & Plan Note (Signed)
Mildly elevated creatinine on last check 1 year ago Continue to monitor creatinine annually continue lisinopril Discussed renal diet

## 2017-10-28 NOTE — Progress Notes (Signed)
Patient: Tina [name], Female    DOB: March 15, 1933, 84 y.o.   MRN: [id number] Visit Date: 10/28/2017  Today's Provider: Lavon Paganini, MD   I, Martha Clan, CMA, am acting as scribe for Lavon Paganini, MD.  Chief Complaint  Patient presents with  . Annual Exam   Subjective:     Complete Physical Tina [name] is a 84 y.o. female. She feels well. She reports exercising none, but stays active. She reports she is sleeping poorly. She rents out upstairs as an apartment, and the tenet works 3rd shift. She states she hears him leaving for work at 1:00 am. She does take naps, and feels well rested.  Last BMD- 06/16/2015- normal -----------------------------------------------------------   Review of Systems  Constitutional: Negative.   HENT: Positive for hearing loss. Negative for congestion, dental problem, drooling, ear discharge, ear pain, facial swelling, mouth sores, nosebleeds, postnasal drip, rhinorrhea, sinus pressure, sinus pain, sneezing, sore throat, tinnitus, trouble swallowing and voice change.   Eyes: Negative.   Respiratory: Negative.   Cardiovascular: Negative.   Gastrointestinal: Negative.   Endocrine: Positive for polyuria. Negative for cold intolerance, heat intolerance, polydipsia and polyphagia.  Genitourinary: Negative.   Musculoskeletal: Positive for arthralgias. Negative for back pain, gait problem, joint swelling, myalgias, neck pain and neck stiffness.  Skin: Negative.   Allergic/Immunologic: Negative.   Neurological: Positive for tremors and weakness. Negative for dizziness, seizures, syncope, facial asymmetry, speech difficulty, light-headedness, numbness and headaches.  Hematological: Negative for adenopathy. Bruises/bleeds easily.  Psychiatric/Behavioral: Positive for sleep disturbance. Negative for agitation, behavioral problems, confusion, decreased concentration, dysphoric mood, hallucinations, self-injury and suicidal ideas.  The patient is not nervous/anxious and is not hyperactive.     Social History   Socioeconomic History  . Marital status: Single    Spouse name: Not on file  . Number of children: 1  . Years of education: Not on file  . Highest education level: Master's degree (e.g., MA, MS, MEng, MEd, MSW, MBA)  Occupational History  . Occupation: retired  Scientific laboratory technician  . Financial resource strain: Not hard at all  . Food insecurity:    Worry: Never true    Inability: Never true  . Transportation needs:    Medical: No    Non-medical: No  Tobacco Use  . Smoking status: Former Smoker    Last attempt to quit: 07/30/1979    Years since quitting: 38.2  . Smokeless tobacco: Never Used  Substance and Sexual Activity  . Alcohol use: No    Comment: 0-1 scotch drink(s) a month  . Drug use: No  . Sexual activity: Not on file  Lifestyle  . Physical activity:    Days per week: Not on file    Minutes per session: Not on file  . Stress: Rather much  Relationships  . Social connections:    Talks on phone: Not on file    Gets together: Not on file    Attends religious service: Not on file    Active member of club or organization: Not on file    Attends meetings of clubs or organizations: Not on file    Relationship status: Not on file  . Intimate partner violence:    Fear of current or ex partner: Not on file    Emotionally abused: Not on file    Physically abused: Not on file    Forced sexual activity: Not on file  Other Topics Concern  . Not on file  Social History  Narrative  . Not on file    No past medical history on file.   Patient Active Problem List   Diagnosis Date Noted  . Abnormal kidney function study 06/08/2015  . Encounter for attention to colostomy ([place]) 06/08/2015  . Clinical depression 06/08/2015  . Calcium blood increased 06/08/2015  . Blood glucose elevated 06/08/2015  . BP (high blood pressure) 06/08/2015  . Cannot sleep 06/08/2015  . Calculus of kidney 06/08/2015    . LBP (low back pain) 06/08/2015  . Adenopathy 06/08/2015  . Venous lake 06/08/2015  . Adynamia 06/08/2015  . Polymyalgia rheumatica ([place]) 05/27/2013  . Coxitis 03/11/2013  . Difficulty hearing 07/08/2009  . Accumulation of fluid in tissues 02/14/2009  . Menopausal and perimenopausal disorder 02/14/2009  . Excessive urination at night 02/14/2009  . 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 Colon, diverticulosis 08/19/2003  . Diaphragmatic hernia 08/19/2003  . Female genuine stress incontinence 08/19/2002  . Arthritis, degenerative 01/20/2001  . HLD (hyperlipidemia) 02/21/1998  . Adiposity 12/21/1994    Past Surgical History:  Procedure Laterality Date  . ABDOMINAL HYSTERECTOMY  1980's  . COLON SURGERY  06/08/13   s/p ruptured diverticulitis, has fistula; had TPN for 3 mths  . DILATION AND CURETTAGE OF UTERUS  1970's  . HIP SURGERY Right 06/01/2013  . KNEE SURGERY Left 2004   total knee replacement     Her family history includes Cancer in her mother.      Current Outpatient Medications:  .  Ibuprofen-diphenhydrAMINE HCl (ADVIL PM) 200-25 MG CAPS, Take by mouth., Disp: , Rfl:  .  lisinopril (PRINIVIL,ZESTRIL) 20 MG tablet, TAKE 1 TABLET DAILY, Disp: 90 tablet, Rfl: 1 .  metoprolol tartrate (LOPRESSOR) 50 MG tablet, TAKE 1 TABLET TWICE A DAY, Disp: 180 tablet, Rfl: 1  Patient Care Team: Virginia Crews, MD as PCP - General (Family Medicine) Dingeldein, Remo Lipps, MD as Consulting Physician (Ophthalmology)     Objective:   Vitals: BP 122/78 (BP Location: Left Arm, Patient Position: Sitting, Cuff Size: Large)   Pulse 68   Temp (!) 97.4 F (36.3 C) (Oral)   Resp 16   Ht 5\' 4"  (1.626 m)   Wt 189 lb (85.7 kg)   SpO2 96%   BMI 32.44 kg/m   Physical Exam  Constitutional: She is oriented to person, place, and time. She appears well-developed and well-nourished. No distress.  HENT:  Head: Normocephalic and atraumatic.  Right Ear: External ear normal.   Left Ear: External ear normal.  Nose: Nose normal.  Mouth/Throat: Oropharynx is clear and moist.  Eyes: Pupils are equal, round, and reactive to light. Conjunctivae and EOM are normal. No scleral icterus.  Neck: Neck supple. No thyromegaly present.  Cardiovascular: Normal rate, regular rhythm, normal heart sounds and intact distal pulses.  No murmur heard. Pulmonary/Chest: Effort normal and breath sounds normal. No respiratory distress. She has no wheezes. She has no rales.  Abdominal: Soft. Bowel sounds are normal. She exhibits no distension. There is no tenderness.  +colostomy, draining well  Musculoskeletal: She exhibits no edema or deformity.  Lymphadenopathy:    She has no cervical adenopathy.  Neurological: She is alert and oriented to person, place, and time.  Skin: Skin is warm and dry. No rash noted.  Psychiatric: She has a normal mood and affect. Her behavior is normal.  Vitals reviewed.   Activities of Daily Living In your present state of health, do you have any difficulty performing the following activities: 10/07/2017  Hearing? Darreld Mclean  Comment Wears bilateral hearing aids.  Vision? N  Difficulty concentrating or making decisions? N  Walking or climbing stairs? N  Dressing or bathing? N  Doing errands, shopping? N  Preparing Food and eating ? N  Using the Toilet? N  In the past six months, have you accidently leaked urine? Y  Comment Wears protection.  Do you have problems with loss of bowel control? N  Comment Has a colostomy bag.  Managing your Medications? N  Managing your Finances? N  Housekeeping or managing your Housekeeping? N  Some recent data might be hidden    Fall Risk Assessment Fall Risk  10/07/2017 08/01/2016 06/08/2015  Falls in the past year? No No No     Depression Screen PHQ 2/9 Scores 10/07/2017 08/01/2016 06/08/2015  PHQ - 2 Score 0 0 0    Pt declined cognitive screening at [facility].  Assessment & Plan:    Annual Physical Reviewed patient's  Family Medical History Reviewed and updated list of patient's medical providers Assessment of cognitive impairment was done Assessed patient's functional ability Established a written schedule for health screening [place] Completed and Reviewed  Exercise Activities and Dietary recommendations Goals    . DIET - REDUCE CALORIE INTAKE     Recommend to continue current duet regimen of not eating fried foods or desserts, and focusing on smaller portions.     . Exercise 150 minutes per week (moderate activity)       Immunization History  Administered Date(s) Administered  . Influenza Split 05/13/2012  . Pneumococcal Conjugate-13 06/08/2015  . Pneumococcal Polysaccharide-23 08/01/2016  . Td 02/09/2008    Health Maintenance  Topic Date Due  . TETANUS/TDAP  02/08/2018  . INFLUENZA VACCINE  02/27/2018  . DEXA SCAN  Completed  . PNA vac Low Risk Adult  Completed     Discussed health benefits of physical activity, and encouraged her to engage in regular exercise appropriate for her age and condition.    ------------------------------------------------------------------------------------------------------------ Problem List Items Addressed This Visit      Cardiovascular and Mediastinum   BP (high blood pressure)    Well-controlled Continue current doses of metoprolol and lisinopril Check BMP today      Relevant Orders   Basic Metabolic Panel (BMET)     Genitourinary   CKD (chronic kidney disease)    Mildly elevated creatinine on last check 1 year ago Continue to monitor creatinine annually continue lisinopril Discussed renal diet      Relevant Orders   Basic Metabolic Panel (BMET)     Other   Cannot sleep    Advised against regular Benadryl use in elderly patients Could consider trying melatonin If needed in the future, could consider small dose of trazodone       Other Visit Diagnoses    Encounter for annual physical exam    -  Primary        Return in about 1 year (around 10/29/2018) for physical.   The entirety of the information documented in the History of Present Illness, Review of Systems and Physical Exam were personally obtained by me. Portions of this information were initially documented by Raquel Sarna Ratchford, CMA and reviewed by me for thoroughness and accuracy.    Virginia Crews, MD, MPH [facility] 10/28/2017 3:58 PM

## 2017-10-28 NOTE — Assessment & Plan Note (Signed)
Advised against regular Benadryl use in elderly patients Could consider trying melatonin If needed in the future, could consider small dose of trazodone

## 2017-10-28 NOTE — Assessment & Plan Note (Signed)
Well-controlled Continue current doses of metoprolol and lisinopril Check BMP today

## 2017-10-29 ENCOUNTER — Telehealth: Payer: Self-pay

## 2017-10-29 LAB — BASIC METABOLIC PANEL
BUN/Creatinine Ratio: 33 — ABNORMAL HIGH (ref 12–28)
BUN: 35 mg/dL — AB (ref 8–27)
CO2: 24 mmol/L (ref 20–29)
CREATININE: 1.07 mg/dL — AB (ref 0.57–1.00)
Calcium: 10 mg/dL (ref 8.7–10.3)
Chloride: 100 mmol/L (ref 96–106)
GFR calc Af Amer: 55 mL/min/{1.73_m2} — ABNORMAL LOW (ref >59)
GFR, EST NON AFRICAN AMERICAN: 48 mL/min/{1.73_m2} — AB (ref >59)
GLUCOSE: 107 mg/dL — AB (ref 65–99)
Potassium: 5.5 mmol/L — ABNORMAL HIGH (ref 3.5–5.2)
SODIUM: 142 mmol/L (ref 134–144)

## 2017-10-29 NOTE — Telephone Encounter (Signed)
Patient advised as directed below. Per patient She is not taking any Potassium supplements or multivitamins with potassium. Reports the only thing she is taking is what we have on file. States "I don't understand why the potassium is slightly elevated".told her we want to recheck her labs in 2 weeks. Patient want you to know she is not taking any potassium first.  Thanks,  -Joseline

## 2017-10-29 NOTE — Telephone Encounter (Signed)
-----   Message from Virginia Crews, MD sent at 10/29/2017  9:08 AM EDT ----- Stable kidney function.  Potassium is slightly high.  Make sure not taking any supplements or multivitamins with potassium.  Decrease intake in diety.  We should recheck in 2 weeks (labs only).  If not decreased at that time, may need to change a medication.

## 2017-10-30 NOTE — Telephone Encounter (Signed)
Noted. Plan to recheck. May need to d/c lisinopril if remains elevated. Likely related to CKD.  Virginia Crews, MD, MPH [facility] 10/30/2017 8:07 AM

## 2017-11-11 ENCOUNTER — Telehealth: Payer: Self-pay | Admitting: Family Medicine

## 2017-11-11 DIAGNOSIS — E875 Hyperkalemia: Secondary | ICD-10-CM

## 2017-11-11 NOTE — Telephone Encounter (Signed)
Patient states that she needs a lab order to have labs rechecked as she was directed after her last labs.  She would like to come Wednesday morning if possible.

## 2017-11-12 NOTE — Telephone Encounter (Signed)
Labs ordered and pt advised. 

## 2017-11-12 NOTE — Telephone Encounter (Signed)
OK to order BMP and MAgnesium for hyperkalemia and have lab slip left at front desk for patient to come at her convenience.  Virginia Crews, MD, MPH [facility] 11/12/2017 11:06 AM

## 2017-11-13 DIAGNOSIS — E875 Hyperkalemia: Secondary | ICD-10-CM | POA: Diagnosis not present

## 2017-11-14 ENCOUNTER — Telehealth: Payer: Self-pay

## 2017-11-14 LAB — BASIC METABOLIC PANEL
BUN/Creatinine Ratio: 32 — ABNORMAL HIGH (ref 12–28)
BUN: 34 mg/dL — ABNORMAL HIGH (ref 8–27)
CALCIUM: 9.5 mg/dL (ref 8.7–10.3)
CO2: 23 mmol/L (ref 20–29)
CREATININE: 1.06 mg/dL — AB (ref 0.57–1.00)
Chloride: 101 mmol/L (ref 96–106)
GFR, EST AFRICAN AMERICAN: 56 mL/min/{1.73_m2} — AB (ref >59)
GFR, EST NON AFRICAN AMERICAN: 49 mL/min/{1.73_m2} — AB (ref >59)
Glucose: 124 mg/dL — ABNORMAL HIGH (ref 65–99)
Potassium: 4.5 mmol/L (ref 3.5–5.2)
Sodium: 141 mmol/L (ref 134–144)

## 2017-11-14 LAB — MAGNESIUM: Magnesium: 1.7 mg/dL (ref 1.6–2.3)

## 2017-11-14 NOTE — Telephone Encounter (Signed)
Left message advising pt. OK per DPR. 

## 2017-11-14 NOTE — Telephone Encounter (Signed)
-----   Message from Virginia Crews, MD sent at 11/14/2017  8:13 AM EDT ----- All electrolytes now normal, including potassium.  Kidney function is stable.  Virginia Crews, MD, MPH [facility] 11/14/2017 8:13 AM

## 2017-12-26 ENCOUNTER — Encounter: Payer: Self-pay | Admitting: Family Medicine

## 2017-12-26 ENCOUNTER — Ambulatory Visit (INDEPENDENT_AMBULATORY_CARE_PROVIDER_SITE_OTHER): Payer: Medicare Other | Admitting: Family Medicine

## 2017-12-26 VITALS — BP 112/62 | HR 66 | Temp 97.4°F | Resp 16 | Wt 184.0 lb

## 2017-12-26 DIAGNOSIS — A09 Infectious gastroenteritis and colitis, unspecified: Secondary | ICD-10-CM | POA: Diagnosis not present

## 2017-12-26 DIAGNOSIS — Z933 Colostomy status: Secondary | ICD-10-CM | POA: Diagnosis not present

## 2017-12-26 DIAGNOSIS — R197 Diarrhea, unspecified: Secondary | ICD-10-CM | POA: Diagnosis not present

## 2017-12-26 NOTE — Progress Notes (Signed)
Patient: Tina [name] Female    DOB: [birth year]   84 y.o.   MRN: [id number] Visit Date: 12/26/2017  Today's Provider: Lavon Paganini, MD   I, Martha Clan, CMA, am acting as scribe for Lavon Paganini, MD.  Chief Complaint  Patient presents with  . Diarrhea   Subjective:    Diarrhea   This is a new problem. The current episode started 1 to 4 weeks ago (x 2.5 weeks). The problem occurs 2 to 4 times per day. The problem has been waxing and waning. The stool consistency is described as watery. Associated symptoms include bloating, headaches and increased flatus. Pertinent negatives include no abdominal pain, arthralgias, chills, coughing, fever, myalgias, sweats or vomiting. Nothing aggravates the symptoms. There are no known risk factors (no recent abx, no change in diet, no new meds, no known sick contacts). She has tried bismuth subsalicylate and change of diet (Pepto Bismol seemed to help until she stopped taking it, BRAT diet did not make a difference) for the symptoms. The treatment provided mild relief. Her past medical history is significant for bowel resection. There is no history of inflammatory bowel disease or a recent abdominal surgery.   S/p bowel resection and colostomy placement in 2014 after ruptured diverticulitis. No problems since then.  It was determined that takedown and reanastomosis was not the best course of action.    Allergies  Allergen Reactions  . Erythromycin      Current Outpatient Medications:  .  lisinopril (PRINIVIL,ZESTRIL) 20 MG tablet, TAKE 1 TABLET DAILY, Disp: 90 tablet, Rfl: 1 .  Melatonin 5 MG TABS, Take by mouth., Disp: , Rfl:  .  metoprolol tartrate (LOPRESSOR) 50 MG tablet, TAKE 1 TABLET TWICE A DAY, Disp: 180 tablet, Rfl: 1 .  predniSONE (DELTASONE) 5 MG tablet, Take 5 mg by mouth daily with breakfast., Disp: , Rfl:   Review of Systems  Constitutional: Negative for chills and fever.  Respiratory: Negative for cough.     Gastrointestinal: Positive for bloating, diarrhea and flatus. Negative for abdominal pain and vomiting.  Musculoskeletal: Negative for arthralgias and myalgias.  Neurological: Positive for headaches.    Social History   Tobacco Use  . Smoking status: Former Smoker    Last attempt to quit: 07/30/1979    Years since quitting: 38.4  . Smokeless tobacco: Never Used  Substance Use Topics  . Alcohol use: No    Comment: 0-1 scotch drink(s) a month   Objective:   BP 112/62 (BP Location: Left Arm, Patient Position: Sitting, Cuff Size: Large)   Pulse 66   Temp (!) 97.4 F (36.3 C) (Oral)   Resp 16   Wt 184 lb (83.5 kg)   SpO2 96%   BMI 31.58 kg/m  Vitals:   12/26/17 1315  BP: 112/62  Pulse: 66  Resp: 16  Temp: (!) 97.4 F (36.3 C)  TempSrc: Oral  SpO2: 96%  Weight: 184 lb (83.5 kg)     Physical Exam  Constitutional: She is oriented to person, place, and time. She appears well-developed and well-nourished. No distress.  HENT:  Head: Normocephalic and atraumatic.  Mouth/Throat: Oropharynx is clear and moist.  Eyes: Conjunctivae are normal.  Neck: Neck supple. No thyromegaly present.  Cardiovascular: Normal rate, regular rhythm, normal heart sounds and intact distal pulses.  No murmur heard. Pulmonary/Chest: Effort normal and breath sounds normal. No respiratory distress. She has no wheezes. She has no rales.  Abdominal: Soft. Bowel sounds are normal. She  exhibits no distension. There is no tenderness. There is no rebound and no guarding.  Colostomy bag in place and skin surrounding it is c/d/i. Bag has watery stool.  Musculoskeletal: She exhibits edema.  Lymphadenopathy:    She has no cervical adenopathy.  Neurological: She is alert and oriented to person, place, and time.  Skin: Skin is warm and dry. Capillary refill takes less than 2 seconds. No rash noted.  Psychiatric: She has a normal mood and affect. Her behavior is normal.  Vitals reviewed.      Assessment &  Plan:    1. Diarrhea of presumed infectious origin - given duration, suspect infectious cause - patient is not systemically ill, so will not start empiric treatment - will wait for results before treating - can continue pepto bismol if this is helpful - can stop BRAT diet as this did not seem to help - check TSH And CBC - check stool pathogen panel and O&P to eval for possible cause - treat pending results - consider GI consult if unable to find a cause  - TSH - CBC w/Diff/Platelet - GI Profile, Stool, PCR - Ova and parasite examination   Return if symptoms worsen or fail to improve.   The entirety of the information documented in the History of Present Illness, Review of Systems and Physical Exam were personally obtained by me. Portions of this information were initially documented by Raquel Sarna Ratchford, CMA and reviewed by me for thoroughness and accuracy.    Virginia Crews, MD, MPH [facility] 12/26/2017 2:10 PM

## 2017-12-26 NOTE — Patient Instructions (Signed)
Diarrhea, Adult Diarrhea is frequent loose and watery bowel movements. Diarrhea can make you feel weak and cause you to become dehydrated. Dehydration can make you tired and thirsty, cause you to have a dry mouth, and decrease how often you urinate. Diarrhea typically lasts 2-3 days. However, it can last longer if it is a sign of something more serious. It is important to treat your diarrhea as told by your health care provider. Follow these instructions at home: Eating and drinking  Follow these recommendations as told by your health care provider:  Take an oral rehydration solution (ORS). This is a drink that is sold at pharmacies and retail stores.  Drink clear fluids, such as water, ice chips, diluted fruit juice, and low-calorie sports drinks.  Eat bland, easy-to-digest foods in small amounts as you are able. These foods include bananas, applesauce, rice, lean meats, toast, and crackers.  Avoid drinking fluids that contain a lot of sugar or caffeine, such as energy drinks, sports drinks, and soda.  Avoid alcohol.  Avoid spicy or fatty foods.  General instructions  Drink enough fluid to keep your urine clear or pale yellow.  Wash your hands often. If soap and water are not available, use hand sanitizer.  Make sure that all people in your household wash their hands well and often.  Take over-the-counter and prescription medicines only as told by your health care provider.  Rest at home while you recover.  Watch your condition for any changes.  Take a warm bath to relieve any burning or pain from frequent diarrhea episodes.  Keep all follow-up visits as told by your health care provider. This is important. Contact a health care provider if:  You have a fever.  Your diarrhea gets worse.  You have new symptoms.  You cannot keep fluids down.  You feel light-headed or dizzy.  You have a headache  You have muscle cramps. Get help right away if:  You have chest  pain.  You feel extremely weak or you faint.  You have bloody or black stools or stools that look like tar.  You have severe pain, cramping, or bloating in your abdomen.  You have trouble breathing or you are breathing very quickly.  Your heart is beating very quickly.  Your skin feels cold and clammy.  You feel confused.  You have signs of dehydration, such as: ? Dark urine, very little urine, or no urine. ? Cracked lips. ? Dry mouth. ? Sunken eyes. ? Sleepiness. ? Weakness. This information is not intended to replace advice given to you by your health care provider. Make sure you discuss any questions you have with your health care provider. Document Released: 07/06/2002 Document Revised: 11/24/2015 Document Reviewed: 03/22/2015 Elsevier Interactive Patient Education  2018 Elsevier Inc.  

## 2017-12-27 LAB — CBC WITH DIFFERENTIAL/PLATELET
BASOS ABS: 0 10*3/uL (ref 0.0–0.2)
BASOS: 0 %
EOS (ABSOLUTE): 0.1 10*3/uL (ref 0.0–0.4)
Eos: 1 %
Hematocrit: 40.1 % (ref 34.0–46.6)
Hemoglobin: 13.3 g/dL (ref 11.1–15.9)
IMMATURE GRANS (ABS): 0 10*3/uL (ref 0.0–0.1)
Immature Granulocytes: 0 %
LYMPHS: 10 %
Lymphocytes Absolute: 0.8 10*3/uL (ref 0.7–3.1)
MCH: 30.9 pg (ref 26.6–33.0)
MCHC: 33.2 g/dL (ref 31.5–35.7)
MCV: 93 fL (ref 79–97)
MONOS ABS: 0.3 10*3/uL (ref 0.1–0.9)
Monocytes: 4 %
Neutrophils Absolute: 7.4 10*3/uL — ABNORMAL HIGH (ref 1.4–7.0)
Neutrophils: 85 %
PLATELETS: 314 10*3/uL (ref 150–450)
RBC: 4.3 x10E6/uL (ref 3.77–5.28)
RDW: 14.2 % (ref 12.3–15.4)
WBC: 8.6 10*3/uL (ref 3.4–10.8)

## 2017-12-27 LAB — TSH: TSH: 2.8 u[IU]/mL (ref 0.450–4.500)

## 2017-12-29 LAB — GI PROFILE, STOOL, PCR
ADENOVIRUS F 40/41: NOT DETECTED
ASTROVIRUS: NOT DETECTED
C DIFFICILE TOXIN A/B: NOT DETECTED
Campylobacter: NOT DETECTED
Cryptosporidium: NOT DETECTED
Cyclospora cayetanensis: NOT DETECTED
ENTEROAGGREGATIVE E COLI: NOT DETECTED
ENTEROPATHOGENIC E COLI: NOT DETECTED
ENTEROTOXIGENIC E COLI: NOT DETECTED
Entamoeba histolytica: NOT DETECTED
Giardia lamblia: NOT DETECTED
Norovirus GI/GII: NOT DETECTED
Plesiomonas shigelloides: NOT DETECTED
ROTAVIRUS A: NOT DETECTED
SHIGA-TOXIN-PRODUCING E COLI: NOT DETECTED
Salmonella: NOT DETECTED
Sapovirus: NOT DETECTED
Shigella/Enteroinvasive E coli: NOT DETECTED
Vibrio cholerae: NOT DETECTED
Vibrio: NOT DETECTED
Yersinia enterocolitica: NOT DETECTED

## 2017-12-30 ENCOUNTER — Telehealth: Payer: Self-pay

## 2017-12-30 NOTE — Telephone Encounter (Signed)
-----   Message from Virginia Crews, MD sent at 12/30/2017  8:35 AM EDT ----- No infectious causes for diarrhea on extensive panel from stool study.  Can use imodium to help with loose stools.  If persists, can consider GI referral.  Bacigalupo, Dionne Bucy, MD, MPH [facility] 12/30/2017 8:35 AM

## 2017-12-30 NOTE — Telephone Encounter (Signed)
Pt advised and agrees with plan. 

## 2018-01-01 LAB — OVA AND PARASITE EXAMINATION

## 2018-04-01 ENCOUNTER — Other Ambulatory Visit: Payer: Self-pay | Admitting: Physician Assistant

## 2018-04-01 DIAGNOSIS — I10 Essential (primary) hypertension: Secondary | ICD-10-CM

## 2018-04-19 ENCOUNTER — Other Ambulatory Visit: Payer: Self-pay | Admitting: Family Medicine

## 2018-04-19 DIAGNOSIS — I10 Essential (primary) hypertension: Secondary | ICD-10-CM

## 2018-04-29 DIAGNOSIS — Z23 Encounter for immunization: Secondary | ICD-10-CM | POA: Diagnosis not present

## 2018-05-27 ENCOUNTER — Other Ambulatory Visit: Payer: Self-pay | Admitting: Physician Assistant

## 2018-06-04 ENCOUNTER — Telehealth: Payer: Self-pay | Admitting: Family Medicine

## 2018-06-04 NOTE — Telephone Encounter (Signed)
[place] faxed documentation for pt's colostomy supplies.  Pending documentation - for colostomy supplies.  Fax number # [phone]  Please advise.  Thanks, [employer]

## 2018-06-05 NOTE — Telephone Encounter (Signed)
I haven't seen this paperwork yet  Aurel Nguyen, Dionne Bucy, MD, MPH [facility] 06/05/2018 3:31 PM

## 2018-06-09 NOTE — Telephone Encounter (Signed)
Pt stated she was returning a missed call from our office and request call back. Thanks TNP

## 2018-06-10 ENCOUNTER — Telehealth: Payer: Self-pay | Admitting: Family Medicine

## 2018-06-10 NOTE — Telephone Encounter (Signed)
°  Opened in error - [facility] °

## 2018-06-10 NOTE — Telephone Encounter (Signed)
Pt calling back again regarding a missed call.  Please call pt back.  Thanks, [employer]

## 2018-06-10 NOTE — Telephone Encounter (Signed)
Left patient a voicemail advising her that there is no record of a missed call documented. I do not know who called patient.

## 2018-07-07 ENCOUNTER — Telehealth: Payer: Self-pay

## 2018-07-07 NOTE — Telephone Encounter (Signed)
Patient is calling the office stating that she get her colostomy supplies from [facility] and she state they informed her she would need an order from doctor for "no sting skin prep." Patient states that Tawanna Sat has placed order for her in past and this time she needs a year worth. KW

## 2018-07-07 NOTE — Telephone Encounter (Signed)
Rx written. We can fax to [facility]  Virginia Crews, MD, MPH [facility] 07/07/2018 4:27 PM

## 2018-07-07 NOTE — Telephone Encounter (Signed)
Order faxed to [facility] at [phone]. Patient advised.

## 2018-07-28 ENCOUNTER — Telehealth: Payer: Self-pay | Admitting: Family Medicine

## 2018-07-28 NOTE — Telephone Encounter (Signed)
Pt's colostomy supplies -[facility] is needing the approval for pt to receive the supplies. Needing the following: Pouch  Barrier Skin prep  [facility] contact number is [phone].  Please advise.  Thanks, [employer]

## 2018-07-28 NOTE — Telephone Encounter (Signed)
OK to give verbal orders for these if they will take that

## 2018-07-29 NOTE — Telephone Encounter (Signed)
Order faxed to [facility] at [phone].

## 2018-09-09 ENCOUNTER — Encounter: Payer: Self-pay | Admitting: Family Medicine

## 2018-09-09 ENCOUNTER — Ambulatory Visit (INDEPENDENT_AMBULATORY_CARE_PROVIDER_SITE_OTHER): Payer: Medicare Other | Admitting: Family Medicine

## 2018-09-09 VITALS — BP 109/70 | HR 58 | Temp 97.6°F | Wt 185.2 lb

## 2018-09-09 DIAGNOSIS — L0291 Cutaneous abscess, unspecified: Secondary | ICD-10-CM | POA: Diagnosis not present

## 2018-09-09 MED ORDER — SULFAMETHOXAZOLE-TRIMETHOPRIM 800-160 MG PO TABS: 1.0000 | ORAL_TABLET | Freq: Two times a day (BID) | ORAL | 0 refills | Status: AC

## 2018-09-09 NOTE — Patient Instructions (Signed)
Skin Abscess  A skin abscess is an infected area on or under your skin that contains a collection of pus and other material. An abscess may also be called a furuncle, carbuncle, or boil. An abscess can occur in or on almost any part of your body. Some abscesses break open (rupture) on their own. Most continue to get worse unless they are treated. The infection can spread deeper into the body and eventually into your blood, which can make you feel ill. Treatment usually involves draining the abscess. What are the causes? An abscess occurs when germs, like bacteria, pass through your skin and cause an infection. This may be caused by:  A scrape or cut on your skin.  A puncture wound through your skin, including a needle injection or insect bite.  Blocked oil or sweat glands.  Blocked and infected hair follicles.  A cyst that forms beneath your skin (sebaceous cyst) and becomes infected. What increases the risk? This condition is more likely to develop in people who:  Have a weak body defense system (immune system).  Have diabetes.  Have dry and irritated skin.  Get frequent injections or use illegal IV drugs.  Have a foreign body in a wound, such as a splinter.  Have problems with their lymph system or veins. What are the signs or symptoms? Symptoms of this condition include:  A painful, firm bump under the skin.  A bump with pus at the top. This may break through the skin and drain. Other symptoms include:  Redness surrounding the abscess site.  Warmth.  Swelling of the lymph nodes (glands) near the abscess.  Tenderness.  A sore on the skin. How is this diagnosed? This condition may be diagnosed based on:  A physical exam.  Your medical history.  A sample of pus. This may be used to find out what is causing the infection.  Blood tests.  Imaging tests, such as an ultrasound, CT scan, or MRI. How is this treated? A small abscess that drains on its own may not  need treatment. Treatment for larger abscesses may include:  Moist heat or heat pack applied to the area several times a day.  A procedure to drain the abscess (incision and drainage).  Antibiotic medicines. For a severe abscess, you may first get antibiotics through an IV and then change to antibiotics by mouth. Follow these instructions at home: Medicines   Take over-the-counter and prescription medicines only as told by your health care provider.  If you were prescribed an antibiotic medicine, take it as told by your health care provider. Do not stop taking the antibiotic even if you start to feel better. Abscess care   If you have an abscess that has not drained, apply heat to the affected area. Use the heat source that your health care provider recommends, such as a moist heat pack or a heating pad. ? Place a towel between your skin and the heat source. ? Leave the heat on for 20-30 minutes. ? Remove the heat if your skin turns bright red. This is especially important if you are unable to feel pain, heat, or cold. You may have a greater risk of getting burned.  Follow instructions from your health care provider about how to take care of your abscess. Make sure you: ? Cover the abscess with a bandage (dressing). ? Change your dressing or gauze as told by your health care provider. ? Wash your hands with soap and water before you change the   dressing or gauze. If soap and water are not available, use hand sanitizer.  Check your abscess every day for signs of a worsening infection. Check for: ? More redness, swelling, or pain. ? More fluid or blood. ? Warmth. ? More pus or a bad smell. General instructions  To avoid spreading the infection: ? Do not share personal care items, towels, or hot tubs with others. ? Avoid making skin contact with other people.  Keep all follow-up visits as told by your health care provider. This is important. Contact a health care provider if you  have:  More redness, swelling, or pain around your abscess.  More fluid or blood coming from your abscess.  Warm skin around your abscess.  More pus or a bad smell coming from your abscess.  A fever.  Muscle aches.  Chills or a general ill feeling. Get help right away if you:  Have severe pain.  See red streaks on your skin spreading away from the abscess. Summary  A skin abscess is an infected area on or under your skin that contains a collection of pus and other material.  A small abscess that drains on its own may not need treatment.  Treatment for larger abscesses may include having a procedure to drain the abscess and taking an antibiotic. This information is not intended to replace advice given to you by your health care provider. Make sure you discuss any questions you have with your health care provider. Document Released: 04/25/2005 Document Revised: 08/29/2017 Document Reviewed: 08/29/2017 Elsevier Interactive Patient Education  2019 [employer]vier Inc[employer]  

## 2018-09-09 NOTE — Progress Notes (Signed)
Patient: Tina [name] Female    DOB: 30-Dec-1932   85 y.o.   MRN: [id number] Visit Date: 09/09/2018  Today's Provider: Lavon Paganini, MD   Chief Complaint  Patient presents with  . Hip Pain  . Cyst   Subjective:    I, Tiburcio Pea, CMA, am acting as a scribe for Lavon Paganini, MD.    Patient presents today C/O a possible cyst at incision scar from hip surgery several years ago. Patient states the cyst has been visible the past 2 weeks and gradually worsening. She reports pain, redness, and drainage from the area.   cyst Allergies  Allergen Reactions  . Erythromycin      Current Outpatient Medications:  .  lisinopril (PRINIVIL,ZESTRIL) 20 MG tablet, TAKE 1 TABLET DAILY, Disp: 90 tablet, Rfl: 4 .  Melatonin 5 MG TABS, Take by mouth., Disp: , Rfl:  .  metoprolol tartrate (LOPRESSOR) 50 MG tablet, TAKE 1 TABLET TWICE A DAY, Disp: 180 tablet, Rfl: 4 .  predniSONE (DELTASONE) 5 MG tablet, TAKE 1 TABLET DAILY, Disp: 90 tablet, Rfl: 4 .  sulfamethoxazole-trimethoprim (BACTRIM DS,SEPTRA DS) 800-160 MG tablet, Take 1 tablet by mouth 2 (two) times daily for 5 days., Disp: 10 tablet, Rfl: 0  Review of Systems  Constitutional: Negative.   Respiratory: Negative.   Cardiovascular: Negative.   Musculoskeletal: Negative.        Hip pain   Skin:       Cyst     Social History   Tobacco Use  . Smoking status: Former Smoker    Last attempt to quit: 07/30/1979    Years since quitting: 39.1  . Smokeless tobacco: Never Used  Substance Use Topics  . Alcohol use: No    Comment: 0-1 scotch drink(s) a month      Objective:   BP 109/70 (BP Location: Left Arm, Cuff Size: Large)   Pulse (!) 58   Temp 97.6 F (36.4 C) (Oral)   Wt 185 lb 3.2 oz (84 kg)   SpO2 97%   BMI 31.79 kg/m  Vitals:   09/09/18 1047 09/09/18 1132  BP: (!) 165/94 109/70  Pulse: (!) 59 (!) 58  Temp: 97.6 F (36.4 C)   TempSrc: Oral   SpO2: 97%   Weight: 185 lb 3.2 oz (84 kg)       Physical Exam Vitals signs reviewed.  Constitutional:      General: She is not in acute distress.    Appearance: Normal appearance. She is not diaphoretic.  HENT:     Head: Normocephalic and atraumatic.     Mouth/Throat:     Pharynx: Oropharynx is clear.  Eyes:     Conjunctiva/sclera: Conjunctivae normal.  Cardiovascular:     Rate and Rhythm: Normal rate and regular rhythm.     Pulses: Normal pulses.     Heart sounds: Normal heart sounds. No murmur.  Pulmonary:     Effort: Pulmonary effort is normal. No respiratory distress.     Breath sounds: Normal breath sounds. No wheezing or rhonchi.  Skin:    General: Skin is warm and dry.     Capillary Refill: Capillary refill takes less than 2 seconds.     Findings: No rash.     Comments: 2cm area of erythema with 1cm central area of fluctuance and draining purulent material on R lateral hip  Neurological:     Mental Status: She is alert and oriented to person, place, and time. Mental status is at  baseline.  Psychiatric:        Mood and Affect: Mood normal.        Behavior: Behavior normal.         Assessment & Plan   1. Abscess - abscess on R lateral hip - surgery in area was remote and this is not related - already draining and no need for I&D at this time - will treat with 5 day course of Bactrim - discussed warm compresses and return precautions    Meds ordered this encounter  Medications  . sulfamethoxazole-trimethoprim (BACTRIM DS,SEPTRA DS) 800-160 MG tablet    Sig: Take 1 tablet by mouth 2 (two) times daily for 5 days.    Dispense:  10 tablet    Refill:  0     Return if symptoms worsen or fail to improve.   The entirety of the information documented in the History of Present Illness, Review of Systems and Physical Exam were personally obtained by me. Portions of this information were initially documented by Tiburcio Pea, CMA and reviewed by me for thoroughness and accuracy.    Virginia Crews,  MD, MPH [facility] 09/09/2018 12:10 PM

## 2018-09-13 ENCOUNTER — Telehealth: Payer: Self-pay | Admitting: Family Medicine

## 2018-09-13 NOTE — Telephone Encounter (Signed)
Patient advised to go to ER. Patient agreed. Patient also reports frequent urination. sd

## 2018-09-13 NOTE — Telephone Encounter (Signed)
Patient states that Dr. B asked her to call in if the antibiotic is helping and we could send in more.  The patient states that the sore is getting better but is defiantly not well.  She states that she feels like the antibiotic is making her shaky and she feels "wiped out."  She states that it is making her very tired.  Her biggest complaint is that her voice is shaking and her hands are shaking and she dreads taking another one because of the way it makes her feel.  [facility] [place].

## 2018-09-15 ENCOUNTER — Telehealth: Payer: Self-pay | Admitting: Family Medicine

## 2018-09-15 MED ORDER — DOXYCYCLINE HYCLATE 100 MG PO TABS: 100.0000 mg | ORAL_TABLET | Freq: Two times a day (BID) | ORAL | 0 refills | Status: DC

## 2018-09-15 NOTE — Telephone Encounter (Signed)
I called and spoke with patient. She states she took all except 1 dose of that antibiotic. She had 1 pill left and she stopped taking it due to it causing shakiness and diarrhea. Patient says the abscess still has some redness and clear drainage. Patient says she doesn't want to take anymore antibiotics like the last one. She states she doesn't mind changing antibiotics, but she prefers to take something that is different from the last one. [facility]

## 2018-09-15 NOTE — Telephone Encounter (Signed)
She should have been close to finishing the 5d course of antibiotic.  Did she have several pills left? How does the abscess look? Is it resolved?

## 2018-09-15 NOTE — Telephone Encounter (Signed)
Pt stopped taking the antibiotic on Sat.  Due to the reaction she was having - some diarrhea and shaky.  Pt needing to know what she needs to do now or what else needs to be called in.  Please advise.  Thanks, [employer]

## 2018-09-15 NOTE — Telephone Encounter (Signed)
Patient advised. [facility].  

## 2018-09-15 NOTE — Telephone Encounter (Signed)
OK let's treat with 5 day course of Doxycycline 100mg  BID x5d, #10 r0.  Let us know if she has side effects.  Take with food.  Let us know if abscess not improving

## 2018-09-19 ENCOUNTER — Ambulatory Visit (INDEPENDENT_AMBULATORY_CARE_PROVIDER_SITE_OTHER): Payer: Medicare Other | Admitting: Physician Assistant

## 2018-09-19 ENCOUNTER — Encounter: Payer: Self-pay | Admitting: Physician Assistant

## 2018-09-19 ENCOUNTER — Telehealth: Payer: Self-pay

## 2018-09-19 VITALS — BP 144/84 | HR 69 | Temp 98.0°F | Resp 16 | Wt 183.0 lb

## 2018-09-19 DIAGNOSIS — L239 Allergic contact dermatitis, unspecified cause: Secondary | ICD-10-CM | POA: Diagnosis not present

## 2018-09-19 DIAGNOSIS — L0291 Cutaneous abscess, unspecified: Secondary | ICD-10-CM

## 2018-09-19 MED ORDER — TRIAMCINOLONE ACETONIDE 0.1 % EX CREA: 1.0000 "application " | TOPICAL_CREAM | Freq: Two times a day (BID) | CUTANEOUS | 0 refills | Status: DC

## 2018-09-19 NOTE — Telephone Encounter (Signed)
Can she come in to be re-evaluated this afternoon or tomorrow morning?

## 2018-09-19 NOTE — Telephone Encounter (Signed)
Patient scheduled at 4:20 today with Adriana.

## 2018-09-19 NOTE — Progress Notes (Signed)
Patient: Tina [name] Female    DOB: [birth year]   85 y.o.   MRN: [id number] Visit Date: 10/03/2018  Today's Provider: Trinna Post, PA-C   Chief Complaint  Patient presents with  . Follow-up   Subjective:     HPI  Patient here today to follow up on abscess, patient reports good tolerance and compliance with medication. Patient had been seen in office for abscess on 09/09/2018 and prescribed bactrim DS 1 tablet twice daily for 5 days. She took this but reported intolerance and this was changed to doxycycline 100 mg twice daily for five days. She reports she has finished this but that area is still red. She reports redness has improved. She denies fevers, nausea, vomiting. She reports there was a scab at the top which she has picked off because she felt she needed to get that off.   Allergies  Allergen Reactions  . Erythromycin      Current Outpatient Medications:  .  doxycycline (VIBRA-TABS) 100 MG tablet, Take 1 tablet (100 mg total) by mouth 2 (two) times daily., Disp: 10 tablet, Rfl: 0 .  lisinopril (PRINIVIL,ZESTRIL) 20 MG tablet, TAKE 1 TABLET DAILY, Disp: 90 tablet, Rfl: 4 .  Melatonin 5 MG TABS, Take by mouth., Disp: , Rfl:  .  metoprolol tartrate (LOPRESSOR) 50 MG tablet, TAKE 1 TABLET TWICE A DAY, Disp: 180 tablet, Rfl: 4 .  predniSONE (DELTASONE) 5 MG tablet, TAKE 1 TABLET DAILY, Disp: 90 tablet, Rfl: 4 .  triamcinolone cream (KENALOG) 0.1 %, Apply 1 application topically 2 (two) times daily., Disp: 30 g, Rfl: 0  Review of Systems  Constitutional: Negative.   Respiratory: Negative.   Cardiovascular: Negative.   Skin: Positive for rash.    Social History   Tobacco Use  . Smoking status: Former Smoker    Last attempt to quit: 07/30/1979    Years since quitting: 39.2  . Smokeless tobacco: Never Used  Substance Use Topics  . Alcohol use: No    Comment: 0-1 scotch drink(s) a month      Objective:   BP (!) 144/84 (BP Location: Left Arm, Patient  Position: Sitting, Cuff Size: Normal)   Pulse 69   Temp 98 F (36.7 C) (Oral)   Resp 16   Wt 183 lb (83 kg)   BMI 31.41 kg/m  Vitals:   09/19/18 1537  BP: (!) 144/84  Pulse: 69  Resp: 16  Temp: 98 F (36.7 C)  TempSrc: Oral  Weight: 183 lb (83 kg)     Physical Exam Skin:    General: Skin is warm and dry.     Findings: Erythema present.  Neurological:     Mental Status: She is oriented to person, place, and time. Mental status is at baseline.  Psychiatric:        Mood and Affect: Mood normal.        Behavior: Behavior normal.     Media Information   Document Information   Photos  Right leg wound   09/19/2018 15:44  Attached To:  Office Visit on 09/19/18 with Trinna Post, PA-C  Source Information   Paulene Floor  [place]    1. Abscess  I have examined wound and it appears to be healing quite nicely. There is some serous drainage but no purulent or bloody drainage. Area is non tender and not fluctuant to palpation. No warmth  is appreciated. I have counseled that patient should not remove any scabs that form. She does appear to have redness in the distribution of the bandaid and suspect she may have a contact allergy to this. Have provided with guaze and surgical tape to keep this covered and some steroid cream to help with this reaction as below. I have counseled that she does not need anymore antibiotics for this.  2. Allergic dermatitis  - triamcinolone cream (KENALOG) 0.1 %; Apply 1 application topically 2 (two) times daily.  Dispense: 30 g; Refill: 0  The entirety of the information documented in the History of Present Illness, Review of Systems and Physical Exam were personally obtained by me. Portions of this information were initially documented by Lynford Humphrey, CMA and reviewed by me for thoroughness and accuracy.   Return if symptoms worsen or fail to improve.  I have spent 25 minutes with this  patient, >50% of which was spent on counseling and coordination of care.      Trinna Post, PA-C  [facility]

## 2018-09-19 NOTE — Telephone Encounter (Signed)
Patient reports that she was seen in the office last week with an abscess and was prescribed antibiotics. She reports that she has one dose left, and her infection is not any better. Patient is requesting another round of abx. [facility]. Thanks!

## 2018-09-19 NOTE — Patient Instructions (Signed)

## 2018-10-30 ENCOUNTER — Encounter: Payer: Self-pay | Admitting: Family Medicine

## 2018-10-30 ENCOUNTER — Telehealth: Payer: Self-pay

## 2018-10-30 ENCOUNTER — Ambulatory Visit: Payer: Self-pay

## 2018-10-30 NOTE — Telephone Encounter (Signed)
I did not receive this fax

## 2018-10-30 NOTE — Telephone Encounter (Signed)
Please advise 

## 2018-10-30 NOTE — Telephone Encounter (Signed)
Patient reports that we should have received a fax from [employer] for her colostomy supplies. Patient requesting call back to confirm that we have received form.

## 2018-11-03 NOTE — Telephone Encounter (Signed)
Called patient to advise her that we did not receive fax from [employer]. She states the supplies came on Saturday. No further action needed.

## 2019-01-16 ENCOUNTER — Telehealth: Payer: Self-pay | Admitting: *Deleted

## 2019-01-16 NOTE — Telephone Encounter (Signed)
Spoke with [employer] representative and she reports that they have an ordered in placed for the patient. They do not need any documentation from [place]. Reports that patient's supplies are in the process to be ship out.

## 2019-01-16 NOTE — Telephone Encounter (Signed)
Normally [employer] faxes [place] the paperwork (prescription form) to sign for them. Has she contacted [employer]?  If not, we can call them to have them fax over their order form.

## 2019-01-16 NOTE — Telephone Encounter (Signed)
Patient called to get her monthly colostomy supplies ordered from [employer]. Please advise?

## 2019-03-11 ENCOUNTER — Other Ambulatory Visit: Payer: Self-pay

## 2019-03-11 ENCOUNTER — Encounter: Payer: Self-pay | Admitting: Family Medicine

## 2019-03-11 ENCOUNTER — Ambulatory Visit (INDEPENDENT_AMBULATORY_CARE_PROVIDER_SITE_OTHER): Payer: Medicare Other | Admitting: Family Medicine

## 2019-03-11 ENCOUNTER — Encounter: Payer: Medicare Other | Admitting: Family Medicine

## 2019-03-11 ENCOUNTER — Ambulatory Visit: Payer: Medicare Other

## 2019-03-11 VITALS — BP 134/82 | HR 72 | Temp 98.0°F | Wt 180.8 lb

## 2019-03-11 DIAGNOSIS — N764 Abscess of vulva: Secondary | ICD-10-CM | POA: Diagnosis not present

## 2019-03-11 DIAGNOSIS — B373 Candidiasis of vulva and vagina: Secondary | ICD-10-CM | POA: Diagnosis not present

## 2019-03-11 DIAGNOSIS — B3731 Acute candidiasis of vulva and vagina: Secondary | ICD-10-CM

## 2019-03-11 MED ORDER — SULFAMETHOXAZOLE-TRIMETHOPRIM 800-160 MG PO TABS: 1.0000 | ORAL_TABLET | Freq: Two times a day (BID) | ORAL | 0 refills | Status: AC

## 2019-03-11 MED ORDER — NYSTATIN 100000 UNIT/GM EX OINT: 1.0000 "application " | TOPICAL_OINTMENT | Freq: Two times a day (BID) | CUTANEOUS | 0 refills | Status: DC

## 2019-03-11 NOTE — Progress Notes (Signed)
Patient: Tina [name] Female    DOB: 1933/06/25   85 y.o.   MRN: [id number] Visit Date: 03/11/2019  Today's Provider: Lavon Paganini, MD   Chief Complaint  Patient presents with  . Rash   Subjective:    I, Tiburcio Pea, CMA, am acting as a Education administrator for Lavon Paganini, MD.   Rash This is a new problem. Episode onset: 8 days ago. The problem is unchanged. The affected locations include the groin and genitalia. The rash is characterized by redness. Treatments tried: [place]. The treatment provided mild relief.   On 8/9 had a spot of blood on pad - unsure where it can from. Has stopped.  Thinks she had an abscess on R side that popped and bled. Has stopped using Desitin Trying to keep the area clean and dry but does have urinary incontinence Rash is itchy   Allergies  Allergen Reactions  . Erythromycin      Current Outpatient Medications:  .  doxycycline (VIBRA-TABS) 100 MG tablet, Take 1 tablet (100 mg total) by mouth 2 (two) times daily., Disp: 10 tablet, Rfl: 0 .  lisinopril (PRINIVIL,ZESTRIL) 20 MG tablet, TAKE 1 TABLET DAILY, Disp: 90 tablet, Rfl: 4 .  Melatonin 5 MG TABS, Take by mouth., Disp: , Rfl:  .  metoprolol tartrate (LOPRESSOR) 50 MG tablet, TAKE 1 TABLET TWICE A DAY, Disp: 180 tablet, Rfl: 4 .  predniSONE (DELTASONE) 5 MG tablet, TAKE 1 TABLET DAILY, Disp: 90 tablet, Rfl: 4 .  triamcinolone cream (KENALOG) 0.1 %, Apply 1 application topically 2 (two) times daily., Disp: 30 g, Rfl: 0 .  nystatin ointment (MYCOSTATIN), Apply 1 application topically 2 (two) times daily., Disp: 30 g, Rfl: 0 .  sulfamethoxazole-trimethoprim (BACTRIM DS) 800-160 MG tablet, Take 1 tablet by mouth 2 (two) times daily for 7 days., Disp: 14 tablet, Rfl: 0  Review of Systems  Constitutional: Negative.   Respiratory: Negative.   Cardiovascular: Negative.   Musculoskeletal: Negative.   Skin: Positive for rash.    Social History   Tobacco Use  . Smoking status: Former  Smoker    Quit date: 07/30/1979    Years since quitting: 39.6  . Smokeless tobacco: Never Used  Substance Use Topics  . Alcohol use: No    Comment: 0-1 scotch drink(s) a month      Objective:   BP 134/82 (BP Location: Right Arm, Patient Position: Sitting, Cuff Size: Normal)   Pulse 72   Temp 98 F (36.7 C) (Oral)   Wt 180 lb 12.8 oz (82 kg)   SpO2 97%   BMI 31.03 kg/m  Vitals:   03/11/19 1406 03/11/19 1622  BP: (!) 155/77 134/82  Pulse: 72   Temp: 98 F (36.7 C)   TempSrc: Oral   SpO2: 97%   Weight: 180 lb 12.8 oz (82 kg)      Physical Exam Vitals signs reviewed.  Constitutional:      General: She is not in acute distress.    Appearance: She is well-developed.  HENT:     Head: Normocephalic and atraumatic.  Eyes:     General: No scleral icterus.    Conjunctiva/sclera: Conjunctivae normal.  Cardiovascular:     Rate and Rhythm: Normal rate and regular rhythm.  Pulmonary:     Effort: Pulmonary effort is normal. No respiratory distress.  Genitourinary:    Comments: Abscess that is actively draining purulent material of R lower labia majora.  Significant erythema of entire vulva and  perineum Skin:    General: Skin is warm and dry.     Capillary Refill: Capillary refill takes less than 2 seconds.  Neurological:     Mental Status: She is alert and oriented to person, place, and time.  Psychiatric:        Behavior: Behavior normal.      No results found for any visits on 03/11/19.     Assessment & Plan     1. Abscess of vulva - new problem - already draining and will not need I&D - will treat with Bactrim x7d - encouraged Sitz baths or warm compress - discussed return precautions  2. Vulval candidiasis - likely 2/2 moisture from incontinence - treat with Nystatin BID until resolution Discussed return precautions    Meds ordered this encounter  Medications  . sulfamethoxazole-trimethoprim (BACTRIM DS) 800-160 MG tablet    Sig: Take 1 tablet by  mouth 2 (two) times daily for 7 days.    Dispense:  14 tablet    Refill:  0  . nystatin ointment (MYCOSTATIN)    Sig: Apply 1 application topically 2 (two) times daily.    Dispense:  30 g    Refill:  0     Return if symptoms worsen or fail to improve.   The entirety of the information documented in the History of Present Illness, Review of Systems and Physical Exam were personally obtained by me. Portions of this information were initially documented by Tiburcio Pea, CMA and reviewed by me for thoroughness and accuracy.    Mahmoud Blazejewski, Dionne Bucy, MD MPH [facility]

## 2019-03-11 NOTE — Patient Instructions (Signed)
Intertrigo Intertrigo is skin irritation or inflammation (dermatitis) that occurs when folds of skin rub together. The irritation can cause a rash and make skin raw and itchy. This condition most commonly occurs in the skin folds of these areas:  Toes.  Armpits.  Groin.  Under the belly.  Under the breasts.  Buttocks. Intertrigo is not passed from person to person (is not contagious). What are the causes? This condition is caused by heat, moisture, rubbing (friction), and not enough air circulation. The condition can be made worse by:  Sweat.  Bacteria.  A fungus, such as yeast. What increases the risk? This condition is more likely to occur if you have moisture in your skin folds. You are more likely to develop this condition if you:  Have diabetes.  Are overweight.  Are not able to move around or are not active.  Live in a warm and moist climate.  Wear splints, braces, or other medical devices.  Are not able to control your bowels or bladder (have incontinence). What are the signs or symptoms? Symptoms of this condition include:  A pink or red skin rash in the skin fold or near the skin fold.  Raw or scaly skin.  Itchiness.  A burning feeling.  Bleeding.  Leaking fluid.  A bad smell. How is this diagnosed? This condition is diagnosed with a medical history and physical exam. You may also have a skin swab to test for bacteria or a fungus. How is this treated? This condition may be treated by:  Cleaning and drying your skin.  Taking an antibiotic medicine or using an antibiotic skin cream for a bacterial infection.  Using an antifungal cream on your skin or taking pills for an infection that was caused by a fungus, such as yeast.  Using a steroid ointment to relieve itchiness and irritation.  Separating the skin fold with a clean cotton cloth to absorb moisture and allow air to flow into the area. Follow these instructions at home:  Keep the  affected area clean and dry.  Do not scratch your skin.  Stay in a cool environment as much as possible. Use an air conditioner or fan, if available.  Apply over-the-counter and prescription medicines only as told by your health care provider.  If you were prescribed an antibiotic medicine, use it as told by your health care provider. Do not stop using the antibiotic even if your condition improves.  Keep all follow-up visits as told by your health care provider. This is important. How is this prevented?   Maintain a healthy weight.  Take care of your feet, especially if you have diabetes. Foot care includes: ? Wearing shoes that fit well. ? Keeping your feet dry. ? Wearing clean, breathable socks.  Protect the skin around your groin and buttocks, especially if you have incontinence. Skin protection includes: ? Following a regular cleaning routine. ? Using skin protectant creams, powders, or ointments. ? Changing protection pads frequently.  Do not wear tight clothes. Wear clothes that are loose, absorbent, and made of cotton.  Wear a bra that gives good support, if needed.  Shower and dry yourself well after activity or exercise. Use a hair dryer on a cool setting to dry between skin folds, especially after you bathe.  If you have diabetes, keep your blood sugar under control. Contact a health care provider if:  Your symptoms do not improve with treatment.  Your symptoms get worse or they spread.  You notice increased redness and  warmth.  You have a fever. Summary  Intertrigo is skin irritation or inflammation (dermatitis) that occurs when folds of skin rub together.  This condition is caused by heat, moisture, rubbing (friction), and not enough air circulation.  This condition may be treated by cleaning and drying your skin and with medicines.  Apply over-the-counter and prescription medicines only as told by your health care provider.  Keep all follow-up visits  as told by your health care provider. This is important. This information is not intended to replace advice given to you by your health care provider. Make sure you discuss any questions you have with your health care provider. Document Released: 07/16/2005 Document Revised: 12/16/2017 Document Reviewed: 12/16/2017 Elsevier Patient Education  2020 [employer].

## 2019-04-24 ENCOUNTER — Encounter: Payer: Self-pay | Admitting: Family Medicine

## 2019-04-24 ENCOUNTER — Ambulatory Visit (INDEPENDENT_AMBULATORY_CARE_PROVIDER_SITE_OTHER): Payer: Medicare Other | Admitting: Family Medicine

## 2019-04-24 ENCOUNTER — Other Ambulatory Visit: Payer: Self-pay

## 2019-04-24 VITALS — BP 135/70 | HR 56 | Temp 96.8°F | Ht 64.0 in | Wt 183.8 lb

## 2019-04-24 DIAGNOSIS — N182 Chronic kidney disease, stage 2 (mild): Secondary | ICD-10-CM

## 2019-04-24 DIAGNOSIS — D692 Other nonthrombocytopenic purpura: Secondary | ICD-10-CM | POA: Diagnosis not present

## 2019-04-24 DIAGNOSIS — I10 Essential (primary) hypertension: Secondary | ICD-10-CM

## 2019-04-24 DIAGNOSIS — Z23 Encounter for immunization: Secondary | ICD-10-CM

## 2019-04-24 DIAGNOSIS — Z0000 Encounter for general adult medical examination without abnormal findings: Secondary | ICD-10-CM | POA: Diagnosis not present

## 2019-04-24 DIAGNOSIS — M353 Polymyalgia rheumatica: Secondary | ICD-10-CM | POA: Diagnosis not present

## 2019-04-24 DIAGNOSIS — T148XXA Other injury of unspecified body region, initial encounter: Secondary | ICD-10-CM | POA: Diagnosis not present

## 2019-04-24 MED ORDER — METOPROLOL TARTRATE 50 MG PO TABS: 50.0000 mg | ORAL_TABLET | Freq: Two times a day (BID) | ORAL | 4 refills | Status: DC

## 2019-04-24 MED ORDER — PREDNISONE 5 MG PO TABS: 5.0000 mg | ORAL_TABLET | Freq: Every day | ORAL | 4 refills | Status: DC

## 2019-04-24 MED ORDER — LISINOPRIL 20 MG PO TABS: 20.0000 mg | ORAL_TABLET | Freq: Every day | ORAL | 4 refills | Status: DC

## 2019-04-24 NOTE — Assessment & Plan Note (Signed)
Wound from February continues to drain and not healing Serous drainage and no signs of infection at this time Referral to Derm for further eval and management

## 2019-04-24 NOTE — Assessment & Plan Note (Signed)
No longer following with Rheum Continue low dose daily prednisone Well controlled

## 2019-04-24 NOTE — Assessment & Plan Note (Signed)
Continue to monitor Continue lisinopril

## 2019-04-24 NOTE — Assessment & Plan Note (Signed)
Initially elevated, but well controlled on manual recheck Continue current medications Recheck metabolic panel F/u in 6 months

## 2019-04-24 NOTE — Assessment & Plan Note (Signed)
Noted on exam ° °

## 2019-04-24 NOTE — Progress Notes (Signed)
Patient: Tina [name], Female    DOB: 08-21-1932, 85 y.o.   MRN: [id number] Visit Date: 04/24/2019  Today's Provider: Lavon Paganini, MD   Chief Complaint  Patient presents with  . Medicare Wellness   Subjective:  I, Tina [name], CMA, am acting as a scribe for Lavon Paganini, MD.    Annual wellness visit [name] is a 85 y.o. female who presents today for her Subsequent Annual Wellness Visit. She feels well. She reports exercising none. She reports she is sleeping fairly well.  ----------------------------------------------------------- Abscess of R lateral thigh. Was open and draining in February, so was treated with abx. Continues to drain Underwear line continues to rub in that area Keeping it covered with gauze   Review of Systems  Constitutional: Negative.   HENT: Negative.   Eyes: Negative.   Respiratory: Negative.   Cardiovascular: Negative.   Gastrointestinal: Negative.   Endocrine: Negative.   Genitourinary: Positive for frequency.  Musculoskeletal: Negative.   Skin: Negative.   Allergic/Immunologic: Negative.   Neurological: Positive for weakness.  Hematological: Negative.   Psychiatric/Behavioral: Positive for sleep disturbance.    Social History   Socioeconomic History  . Marital status: Single    Spouse name: Not on file  . Number of children: 1  . Years of education: Not on file  . Highest education level: Master's degree (e.g., MA, MS, MEng, MEd, MSW, MBA)  Occupational History  . Occupation: retired  Scientific laboratory technician  . Financial resource strain: Not hard at all  . Food insecurity    Worry: Never true    Inability: Never true  . Transportation needs    Medical: No    Non-medical: No  Tobacco Use  . Smoking status: Former Smoker    Quit date: 07/30/1979    Years since quitting: 39.7  . Smokeless tobacco: Never Used  Substance and Sexual Activity  . Alcohol use: No    Comment: 0-1 scotch drink(s) a month  . Drug use: No   . Sexual activity: Not on file  Lifestyle  . Physical activity    Days per week: Not on file    Minutes per session: Not on file  . Stress: Rather much  Relationships  . Social Herbalist on phone: Not on file    Gets together: Not on file    Attends religious service: Not on file    Active member of club or organization: Not on file    Attends meetings of clubs or organizations: Not on file    Relationship status: Not on file  . Intimate partner violence    Fear of current or ex partner: Not on file    Emotionally abused: Not on file    Physically abused: Not on file    Forced sexual activity: Not on file  Other Topics Concern  . Not on file  Social History Narrative  . Not on file    Patient Active Problem List   Diagnosis Date Noted  . Nonhealing nonsurgical wound 04/24/2019  . Senile purpura ([place]) 04/24/2019  . CKD (chronic kidney disease) 06/08/2015  . Clinical depression 06/08/2015  . BP (high blood pressure) 06/08/2015  . Cannot sleep 06/08/2015  . Adenopathy 06/08/2015  . Venous lake 06/08/2015  . Adynamia 06/08/2015  . Polymyalgia rheumatica ([place]) 05/27/2013  . Difficulty hearing 07/08/2009  . Accumulation of fluid in tissues 02/14/2009  . Menopausal and perimenopausal disorder 02/14/2009  . Excessive urination at night 02/14/2009  . 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 Colon, diverticulosis 08/19/2003  . Diaphragmatic hernia 08/19/2003  . Female genuine stress incontinence 08/19/2002  . Arthritis, degenerative 01/20/2001  . Adiposity 12/21/1994    Past Surgical History:  Procedure Laterality Date  . ABDOMINAL HYSTERECTOMY  1980's  . COLON SURGERY  06/08/13   s/p ruptured diverticulitis, has fistula; had TPN for 3 mths  . DILATION AND CURETTAGE OF UTERUS  1970's  . HIP SURGERY Right 06/01/2013  . KNEE SURGERY Left 2004   total knee replacement     Her family history includes Cancer in her mother.     Previous Medications    MELATONIN 5 MG TABS    Take by mouth.    Patient Care Team: Virginia Crews, MD as PCP - General (Family Medicine) Dingeldein, Remo Lipps, MD as Consulting Physician (Ophthalmology)      Objective:   Vitals: BP 135/70   Pulse (!) 56   Temp (!) 96.8 F (36 C) (Temporal)   Ht 5\' 4"  (1.626 m)   Wt 183 lb 12.8 oz (83.4 kg)   SpO2 96%   BMI 31.55 kg/m   Physical Exam Vitals signs reviewed.  Constitutional:      General: She is not in acute distress.    Appearance: Normal appearance. She is well-developed. She is not diaphoretic.  HENT:     Head: Normocephalic and atraumatic.     Right Ear: External ear normal.     Left Ear: External ear normal.  Eyes:     General: No scleral icterus.    Conjunctiva/sclera: Conjunctivae normal.     Pupils: Pupils are equal, round, and reactive to light.  Neck:     Musculoskeletal: Neck supple.     Thyroid: No thyromegaly.  Cardiovascular:     Rate and Rhythm: Normal rate and regular rhythm.     Pulses: Normal pulses.     Heart sounds: Normal heart sounds. No murmur.  Pulmonary:     Effort: Pulmonary effort is normal. No respiratory distress.     Breath sounds: Normal breath sounds. No wheezing or rales.  Abdominal:     General: There is no distension.     Palpations: Abdomen is soft.     Tenderness: There is no abdominal tenderness.     Comments: Colostomy bag in place  Musculoskeletal:        General: No deformity.     Right lower leg: No edema.     Left lower leg: No edema.  Lymphadenopathy:     Cervical: No cervical adenopathy.  Skin:    General: Skin is warm and dry.     Capillary Refill: Capillary refill takes less than 2 seconds.     Findings: No rash.     Comments: 0.5cm erythematous lesion on R lateral hip.  Serous drainage. No induration or fluctuance  Neurological:     Mental Status: She is alert and oriented to person, place, and time. Mental status is at baseline.  Psychiatric:        Mood and Affect: Mood  normal.        Behavior: Behavior normal.        Thought Content: Thought content normal.     Activities of Daily Living In your present state of health, do you have any difficulty performing the following activities: 04/24/2019  Hearing? Y  Vision? N  Difficulty concentrating or making decisions? N  Walking or climbing stairs? Y  Dressing or bathing? N  Doing errands, shopping? Darreld Mclean  Some recent data might be hidden    Fall Risk Assessment Fall Risk  04/24/2019 10/07/2017 08/01/2016 06/08/2015  Falls in the past year? 0 No No No  Number falls in past yr: 0 - - -  Injury with Fall? 0 - - -     Depression Screen PHQ 2/9 Scores 04/24/2019 10/07/2017 08/01/2016 06/08/2015  PHQ - 2 Score 2 0 0 0  PHQ- 9 Score 7 - - -    Cognitive Testing - 6-CIT  Correct? Score   What year is it? yes 0 0 or 4  What month is it? yes 0 0 or 3  Memorize:    Pia Mau,  44,  [address],  [place],      What time is it? (within 1 hour) yes 0 0 or 3  Count backwards from 20 yes 0 0, 2, or 4  Name the months of the year yes 0 0, 2, or 4  Repeat name & address above yes 6 0, 2, 4, 6, 8, or 10       TOTAL SCORE  6/28   Interpretation:  Normal  Normal (0-7) Abnormal (8-28)       Assessment & Plan:     Annual Wellness Visit  Reviewed patient's Family Medical History Reviewed and updated list of patient's medical providers Assessment of cognitive impairment was done Assessed patient's functional ability Established a written schedule for health screening [place] Completed and Reviewed  Exercise Activities and Dietary recommendations Goals    . DIET - REDUCE CALORIE INTAKE     Recommend to continue current duet regimen of not eating fried foods or desserts, and focusing on smaller portions.     . Exercise 150 minutes per week (moderate activity)       Immunization History  Administered Date(s) Administered  . Fluad Quad(high Dose 65+) 04/24/2019  . Influenza Split  05/13/2012  . Influenza, High Dose Seasonal PF 04/29/2018  . Pneumococcal Conjugate-13 06/08/2015  . Pneumococcal Polysaccharide-23 08/01/2016  . Td 02/09/2008    Health Maintenance  Topic Date Due  . TETANUS/TDAP  04/23/2024 (Originally 02/08/2018)  . INFLUENZA VACCINE  Completed  . DEXA SCAN  Completed  . PNA vac Low Risk Adult  Completed     Discussed health benefits of physical activity, and encouraged her to engage in regular exercise appropriate for her age and condition.    ------------------------------------------------------------------------------------------------------------  Problem List Items Addressed This Visit      Cardiovascular and Mediastinum   BP (high blood pressure)    Initially elevated, but well controlled on manual recheck Continue current medications Recheck metabolic panel F/u in 6 months      Relevant Medications   metoprolol tartrate (LOPRESSOR) 50 MG tablet   lisinopril (ZESTRIL) 20 MG tablet   Other Relevant Orders   Comprehensive metabolic panel   Lipid panel   Senile purpura ([place])    Noted on exam      Relevant Medications   metoprolol tartrate (LOPRESSOR) 50 MG tablet   lisinopril (ZESTRIL) 20 MG tablet     Genitourinary   CKD (chronic kidney disease)    Continue to monitor Continue lisinopril      Relevant Orders   Comprehensive metabolic panel     Other   Anarthritic rheumatoid disease ([place])    No longer following with Rheum Continue low dose daily prednisone Well controlled      Polymyalgia rheumatica ([place])    No longer following with Rheum Continue  low dose daily prednisone Well controlled      Nonhealing nonsurgical wound    Wound from February continues to drain and not healing Serous drainage and no signs of infection at this time Referral to Derm for further eval and management      Relevant Orders   Ambulatory referral to Dermatology    Other Visit Diagnoses    Medicare annual wellness visit,  subsequent    -  Primary   Need for influenza vaccination       Relevant Orders   Flu Vaccine QUAD High Dose(Fluad) (Completed)       Return in about 6 months (around 10/22/2019) for chronic disease f/u.   The entirety of the information documented in the History of Present Illness, Review of Systems and Physical Exam were personally obtained by me. Portions of this information were initially documented by Tina [name], CMA and reviewed by me for thoroughness and accuracy.    Skippy [place], Dionne Bucy, MD MPH [facility]

## 2019-04-24 NOTE — Patient Instructions (Signed)
Preventive Care 83 Years and Older, Female Preventive care refers to lifestyle choices and visits with your health care provider that can promote health and wellness. This includes:  A yearly physical exam. This is also called an annual well check.  Regular dental and eye exams.  Immunizations.  Screening for certain conditions.  Healthy lifestyle choices, such as diet and exercise. What can I expect for my preventive care visit? Physical exam Your health care provider will check:  Height and weight. These may be used to calculate body mass index (BMI), which is a measurement that tells if you are at a healthy weight.  Heart rate and blood pressure.  Your skin for abnormal spots. Counseling Your health care provider may ask you questions about:  Alcohol, tobacco, and drug use.  Emotional well-being.  Home and relationship well-being.  Sexual activity.  Eating habits.  History of falls.  Memory and ability to understand (cognition).  Work and work Statistician.  Pregnancy and menstrual history. What immunizations do I need?  Influenza (flu) vaccine  This is recommended every year. Tetanus, diphtheria, and pertussis (Tdap) vaccine  You may need a Td booster every 10 years. Varicella (chickenpox) vaccine  You may need this vaccine if you have not already been vaccinated. Zoster (shingles) vaccine  You may need this after age 33. Pneumococcal conjugate (PCV13) vaccine  One dose is recommended after age 83. Pneumococcal polysaccharide (PPSV23) vaccine  One dose is recommended after age 83. Measles, mumps, and rubella (MMR) vaccine  You may need at least one dose of MMR if you were born in 1957 or later. You may also need a second dose. Meningococcal conjugate (MenACWY) vaccine  You may need this if you have certain conditions. Hepatitis A vaccine  You may need this if you have certain conditions or if you travel or work in places where you may be exposed  to hepatitis A. Hepatitis B vaccine  You may need this if you have certain conditions or if you travel or work in places where you may be exposed to hepatitis B. Haemophilus influenzae type b (Hib) vaccine  You may need this if you have certain conditions. You may receive vaccines as individual doses or as more than one vaccine together in one shot (combination vaccines). Talk with your health care provider about the risks and benefits of combination vaccines. What tests do I need? Blood tests  Lipid and cholesterol levels. These may be checked every 5 years, or more frequently depending on your overall health.  Hepatitis C test.  Hepatitis B test. Screening  Lung cancer screening. You may have this screening every year starting at age 83 if you have a 30-pack-year history of smoking and currently smoke or have quit within the past 15 years.  Colorectal cancer screening. All adults should have this screening starting at age 83 and continuing until age 15. Your health care provider may recommend screening at age 83 if you are at increased risk. You will have tests every 1-10 years, depending on your results and the type of screening test.  Diabetes screening. This is done by checking your blood sugar (glucose) after you have not eaten for a while (fasting). You may have this done every 1-3 years.  Mammogram. This may be done every 1-2 years. Talk with your health care provider about how often you should have regular mammograms.  BRCA-related cancer screening. This may be done if you have a family history of breast, ovarian, tubal, or peritoneal cancers.  Other tests  Sexually transmitted disease (STD) testing.  Bone density scan. This is done to screen for osteoporosis. You may have this done starting at age 83. Follow these instructions at home: Eating and drinking  Eat a diet that includes fresh fruits and vegetables, whole grains, lean protein, and low-fat dairy products. Limit  your intake of foods with high amounts of sugar, saturated fats, and salt.  Take vitamin and mineral supplements as recommended by your health care provider.  Do not drink alcohol if your health care provider tells you not to drink.  If you drink alcohol: ? Limit how much you have to 0-1 drink a day. ? Be aware of how much alcohol is in your drink. In the [place] one drink equals one 12 oz bottle of beer (355 mL), one 5 oz glass of wine (148 mL), or one 1 oz glass of hard liquor (44 mL). Lifestyle  Take daily care of your teeth and gums.  Stay active. Exercise for at least 30 minutes on 5 or more days each week.  Do not use any products that contain nicotine or tobacco, such as cigarettes, e-cigarettes, and chewing tobacco. If you need help quitting, ask your health care provider.  If you are sexually active, practice safe sex. Use a condom or other form of protection in order to prevent STIs (sexually transmitted infections).  Talk with your health care provider about taking a low-dose aspirin or statin. What's next?  Go to your health care provider once a year for a well check visit.  Ask your health care provider how often you should have your eyes and teeth checked.  Stay up to date on all vaccines. This information is not intended to replace advice given to you by your health care provider. Make sure you discuss any questions you have with your health care provider. Document Released: 08/12/2015 Document Revised: 07/10/2018 Document Reviewed: 07/10/2018 Elsevier Patient Education  2020 [employer].

## 2019-04-25 LAB — LIPID PANEL
Chol/HDL Ratio: 3 ratio (ref 0.0–4.4)
Cholesterol, Total: 205 mg/dL — ABNORMAL HIGH (ref 100–199)
HDL: 69 mg/dL (ref >39)
LDL Chol Calc (NIH): 114 mg/dL — ABNORMAL HIGH (ref 0–99)
Triglycerides: 124 mg/dL (ref 0–149)
VLDL Cholesterol Cal: 22 mg/dL (ref 5–40)

## 2019-04-25 LAB — COMPREHENSIVE METABOLIC PANEL
ALT: 10 IU/L (ref 0–32)
AST: 21 IU/L (ref 0–40)
Albumin/Globulin Ratio: 1.6 (ref 1.2–2.2)
Albumin: 3.8 g/dL (ref 3.6–4.6)
Alkaline Phosphatase: 74 IU/L (ref 39–117)
BUN/Creatinine Ratio: 31 — ABNORMAL HIGH (ref 12–28)
BUN: 34 mg/dL — ABNORMAL HIGH (ref 8–27)
Bilirubin Total: 0.2 mg/dL (ref 0.0–1.2)
CO2: 25 mmol/L (ref 20–29)
Calcium: 9.5 mg/dL (ref 8.7–10.3)
Chloride: 102 mmol/L (ref 96–106)
Creatinine, Ser: 1.09 mg/dL — ABNORMAL HIGH (ref 0.57–1.00)
GFR calc Af Amer: 54 mL/min/{1.73_m2} — ABNORMAL LOW (ref >59)
GFR calc non Af Amer: 47 mL/min/{1.73_m2} — ABNORMAL LOW (ref >59)
Globulin, Total: 2.4 g/dL (ref 1.5–4.5)
Glucose: 96 mg/dL (ref 65–99)
Potassium: 5 mmol/L (ref 3.5–5.2)
Sodium: 140 mmol/L (ref 134–144)
Total Protein: 6.2 g/dL (ref 6.0–8.5)

## 2019-04-27 ENCOUNTER — Telehealth: Payer: Self-pay | Admitting: Family Medicine

## 2019-04-27 NOTE — Telephone Encounter (Signed)
Judson Roch can this be changed or do I need to put in a whole new referral?

## 2019-04-27 NOTE — Telephone Encounter (Signed)
Patient called office requesting her dermatology referral be changes. Patient wants to be referred to [facility] Dr. Evorn Gong. Please advise?

## 2019-08-20 DIAGNOSIS — L720 Epidermal cyst: Secondary | ICD-10-CM | POA: Diagnosis not present

## 2019-08-20 DIAGNOSIS — L988 Other specified disorders of the skin and subcutaneous tissue: Secondary | ICD-10-CM | POA: Diagnosis not present

## 2019-09-01 ENCOUNTER — Other Ambulatory Visit: Payer: Self-pay

## 2019-09-01 ENCOUNTER — Ambulatory Visit (INDEPENDENT_AMBULATORY_CARE_PROVIDER_SITE_OTHER): Payer: Medicare Other | Admitting: Surgery

## 2019-09-01 ENCOUNTER — Encounter: Payer: Self-pay | Admitting: Surgery

## 2019-09-01 VITALS — BP 128/72 | HR 78 | Temp 97.7°F | Ht 63.0 in | Wt 183.2 lb

## 2019-09-01 DIAGNOSIS — T148XXA Other injury of unspecified body region, initial encounter: Secondary | ICD-10-CM | POA: Diagnosis not present

## 2019-09-01 NOTE — Progress Notes (Signed)
Patient ID: Tina [name], female   DOB: [birth year], 86 y.o.   MRN: [id number]  Chief Complaint: Draining lesion right lateral hip  History of Present Illness Tina [name] is a 86 y.o. female with history of right hip replacement approximately 7 years ago.  She reports a year-long history of a focus which drains a yellow liquid periodically.  She denies fevers or chills.  She reports that she does sit in a recliner at that is now somewhat tight on her hips.  She denies any hip pain.  She was referred to a dermatologist who biopsied the surface of the area with biopsies confirming benignity consistent with epidermoid cyst with deep necrosis.  She presents today with 2 sutures in the area following her biopsy.  Past Medical History No past medical history on file.    Past Surgical History:  Procedure Laterality Date  . ABDOMINAL HYSTERECTOMY  1980's  . COLON SURGERY  06/08/13   s/p ruptured diverticulitis, has fistula; had TPN for 3 mths  . DILATION AND CURETTAGE OF UTERUS  1970's  . HIP SURGERY Right 06/01/2013  . KNEE SURGERY Left 2004   total knee replacement     Allergies  Allergen Reactions  . Erythromycin     Current Outpatient Medications  Medication Sig Dispense Refill  . lisinopril (ZESTRIL) 20 MG tablet Take 1 tablet (20 mg total) by mouth daily. 90 tablet 4  . Melatonin 5 MG TABS Take by mouth.    . metoprolol tartrate (LOPRESSOR) 50 MG tablet Take 1 tablet (50 mg total) by mouth 2 (two) times daily. 180 tablet 4  . Multiple Vitamin (MULTIVITAMIN) tablet Take 1 tablet by mouth daily.    . predniSONE (DELTASONE) 5 MG tablet Take 1 tablet (5 mg total) by mouth daily. 90 tablet 4   No current facility-administered medications for this visit.    Family History Family History  Problem Relation Age of Onset  . Cancer Mother        breast      Social History Social History   Tobacco Use  . Smoking status: Former Smoker    Quit date: 07/30/1979    Years  since quitting: 40.1  . Smokeless tobacco: Never Used  Substance Use Topics  . Alcohol use: No    Comment: 0-1 scotch drink(s) a month  . Drug use: No        Review of Systems  Constitutional: Negative.   HENT: Positive for hearing loss.   Eyes: Negative.   Respiratory: Negative.   Cardiovascular: Negative.   Gastrointestinal: Negative.   Genitourinary: Negative.   Musculoskeletal: Negative.   Skin: Negative.   Neurological: Negative.   Endo/Heme/Allergies: Negative.       Physical Exam Blood pressure 128/72, pulse 78, temperature 97.7 F (36.5 C), height 5\' 3"  (1.6 m), weight 183 lb 3.2 oz (83.1 kg), SpO2 93 %. Last Weight  Most recent update: 09/01/2019  2:06 PM   Weight  83.1 kg (183 lb 3.2 oz)            CONSTITUTIONAL: Well developed, and nourished, appropriately responsive and aware without distress.   EYES: Sclera non-icteric.   EARS, NOSE, MOUTH AND THROAT: Mask worn.    Hearing is intact to voice, but reports being hard of hearing.  NECK: Trachea is midline  LYMPH NODES:  Lymph nodes in the neck are not enlarged. RESPIRATORY:  Lungs are clear, and breath sounds are equal bilaterally. Normal respiratory effort without pathologic use of  accessory muscles. CARDIOVASCULAR: Heart is regular in rate and rhythm. GI: The abdomen is soft, nontender, and nondistended.  There is a left-sided colostomy that is flush with the skin and of normal diameter.  There is likely an underlying parastomal hernia present.  There were no palpable masses. I did not appreciate hepatosplenomegaly. There were normal bowel sounds. MUSCULOSKELETAL:  Symmetrical muscle tone appreciated in all four extremities.    SKIN: Skin turgor is normal. No pathologic skin lesions appreciated.  I removed 3 small nylon sutures from a draining incision at the inferior most apex of her right lateral hip scar.  There was clear serous drainage present.  I utilized a sterile Q-tip and probed to a depth of  approximately 8 cm plus.  I found no additional cavities, or changes in drainage.  I did find some dark blood tenting to the new drainage after probing to that depth.  I then packed it with half-inch wide packing strip.  We applied a dry cover dressing to this. NEUROLOGIC:  Motor and sensation appear grossly normal.  Cranial nerves are grossly without defect. PSYCH:  Alert and oriented to person, place and time. Affect is appropriate for situation.  Data Reviewed I have personally reviewed what is currently available of the patient's imaging, recent labs and medical records.   Labs:  CBC Latest Ref Rng & Units 12/26/2017 08/09/2016 10/09/2015  WBC 3.4 - 10.8 x10E3/uL 8.6 7.0 7.3  Hemoglobin 11.1 - 15.9 g/dL 13.3 12.7 13.3  Hematocrit 34.0 - 46.6 % 40.1 39.1 40.2  Platelets 150 - 450 x10E3/uL 314 273 211   CMP Latest Ref Rng & Units 04/24/2019 11/13/2017 10/28/2017  Glucose 65 - 99 mg/dL 96 124(H) 107(H)  BUN 8 - 27 mg/dL 34(H) 34(H) 35(H)  Creatinine 0.57 - 1.00 mg/dL 1.09(H) 1.06(H) 1.07(H)  Sodium 134 - 144 mmol/L 140 141 142  Potassium 3.5 - 5.2 mmol/L 5.0 4.5 5.5(H)  Chloride 96 - 106 mmol/L 102 101 100  CO2 20 - 29 mmol/L 25 23 24   Calcium 8.7 - 10.3 mg/dL 9.5 9.5 10.0  Total Protein 6.0 - 8.5 g/dL 6.2 - -  Total Bilirubin 0.0 - 1.2 mg/dL 0.2 - -  Alkaline Phos 39 - 117 IU/L 74 - -  AST 0 - 40 IU/L 21 - -  ALT 0 - 32 IU/L 10 - -      Imaging:  Within last 24 hrs: No results found.  Assessment    Sinus tract, likely chronic and related to the nonhealing surgical wound from her prior hip replacement.  This likely may have developed from repetitive minor trauma's likely from sitting in a chair that confines her hips. Patient Active Problem List   Diagnosis Date Noted  . Nonhealing nonsurgical wound 04/24/2019  . Senile purpura ([place]) 04/24/2019  . CKD (chronic kidney disease) 06/08/2015  . Clinical depression 06/08/2015  . BP (high blood pressure) 06/08/2015  . Cannot sleep  06/08/2015  . Adenopathy 06/08/2015  . Venous lake 06/08/2015  . Adynamia 06/08/2015  . Polymyalgia rheumatica ([place]) 05/27/2013  . Difficulty hearing 07/08/2009  . Accumulation of fluid in tissues 02/14/2009  . Menopausal and perimenopausal disorder 02/14/2009  . Excessive urination at night 02/14/2009  . Anarthritic rheumatoid disease ([place]) 02/14/2009  . Colon, diverticulosis 08/19/2003  . Diaphragmatic hernia 08/19/2003  . Female genuine stress incontinence 08/19/2002  . Arthritis, degenerative 01/20/2001  . Adiposity 12/21/1994    Plan    We will have her inch out her packing strip  on a daily basis, reevaluate her sinus tract in 1 week.  We discussed other interventions including imaging, exploration or local wound care utilizing wicking and mechanical dressing wound care, possible utilization of silver nitrate.  At this point in time I am not too concerned about seeking out underlying foreign body involvement.  There is no evidence of active infection and I do not believe this represents any form of neoplastic changes.  We will see her back next week.  Face-to-face time spent with the patient and accompanying care providers(if present) was 30 minutes, with more than 50% of the time spent counseling, educating, and coordinating care of the patient.      Ronny Bacon M.D., FACS 09/01/2019, 2:33 PM

## 2019-09-01 NOTE — Patient Instructions (Addendum)
Replace the dry dressing on top 1-2 times a day or as needed, do not remove the wicking/packing.  Everyday pull a little of the packing about 1/2 inch or so.   Follow up here next week for a recheck of the area.

## 2019-09-08 ENCOUNTER — Telehealth: Payer: Self-pay | Admitting: Emergency Medicine

## 2019-09-08 ENCOUNTER — Ambulatory Visit (INDEPENDENT_AMBULATORY_CARE_PROVIDER_SITE_OTHER): Payer: Medicare Other | Admitting: Surgery

## 2019-09-08 ENCOUNTER — Other Ambulatory Visit: Payer: Self-pay

## 2019-09-08 ENCOUNTER — Encounter: Payer: Self-pay | Admitting: Surgery

## 2019-09-08 VITALS — BP 132/84 | HR 76 | Temp 97.2°F | Resp 14 | Ht 63.0 in | Wt 185.2 lb

## 2019-09-08 DIAGNOSIS — T148XXA Other injury of unspecified body region, initial encounter: Secondary | ICD-10-CM

## 2019-09-08 NOTE — Telephone Encounter (Signed)
I called Dr. Claiborne Billings Scott's office and spoke to [facility] in regards to making an appt for pt to see provider.   Appt made for 09/23/19 at 1:45pm.  Address: [facility] [address] [place], [place] [address]  Dr Christian Mate and pt made aware of appointment above.

## 2019-09-08 NOTE — Progress Notes (Signed)
Surgical Clinic Progress/Follow-up Note   HPI:  86 y.o. Female presents to clinic for right hip wound follow-up 1 week  follow the last evaluation. Patient reports no remarkable improvement/resolution of prior issues and has been gradually removing the prior wicking, changing a cover dressing twice daily with minimal serous drainage, denies N/V, fever/chills, CP, or SOB.  She denies pain to the right hip region.  Review of Systems:  Constitutional: denies fever/chills  ENT: denies sore throat, hearing problems  Respiratory: denies shortness of breath, wheezing  Cardiovascular: denies chest pain, palpitations  Gastrointestinal: denies abdominal pain, N/V, or diarrhea/and bowel function as per interval history Skin: Denies any other rashes or skin discolorations except post-surgical wounds as per interval history  Vital Signs:  BP 132/84   Pulse 76   Temp (!) 97.2 F (36.2 C) (Temporal)   Resp 14   Ht 5\' 3"  (1.6 m)   Wt 185 lb 3.2 oz (84 kg)   SpO2 96%   BMI 32.81 kg/m    Physical Exam:  Constitutional:  -- Normal/Obese body habitus  -- Awake, alert, and oriented x3  Pulmonary:  -- No crackles -- Equal breath sounds bilaterally -- Breathing non-labored at rest Cardiovascular:  -- S1, S2 present  -- No pericardial rubs  Gastrointestinal:  -- Soft and non-distended, non-tender/with, no guarding/rebound tenderness  -- Post-surgical incisions all right hip area well-approximated at the apex there is a 1 cm wide orifice which when probed after prep with Betadine with a sterile Q-tip extends 7 to 9 cm and deep traveling medially and anteriorly without any peri-incisional erythema or drainage.  On 2 of the 3 probes I retrieved fragments of tissue or fibrinous exudate not entirely certain.  No vault egress of serous fluid, blood or purulent drainage noted.   --  Musculoskeletal / Integumentary:  -- Wounds or skin discoloration: None appreciated except post-surgical incisions as  described above () -- Extremities: B/L UE and LE FROM, hands and feet warm, no edema   Laboratory studies: None.  Imaging: No new pertinent imaging available for review   Assessment:  86 y.o. yo Female with a problem list including...  Patient Active Problem List   Diagnosis Date Noted  . Nonhealing nonsurgical wound 04/24/2019  . Senile purpura ([place]) 04/24/2019  . CKD (chronic kidney disease) 06/08/2015  . Clinical depression 06/08/2015  . BP (high blood pressure) 06/08/2015  . Cannot sleep 06/08/2015  . Adenopathy 06/08/2015  . Venous lake 06/08/2015  . Adynamia 06/08/2015  . Polymyalgia rheumatica ([place]) 05/27/2013  . Difficulty hearing 07/08/2009  . Accumulation of fluid in tissues 02/14/2009  . Menopausal and perimenopausal disorder 02/14/2009  . Excessive urination at night 02/14/2009  . 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 Colon, diverticulosis 08/19/2003  . Diaphragmatic hernia 08/19/2003  . Female genuine stress incontinence 08/19/2002  . Arthritis, degenerative 01/20/2001  . Adiposity 12/21/1994    presents to clinic for follow-up evaluation of right hip sinus tract, progressing well.  Plan:  I repacked the sinus tract with quarter inch wide plain packing strip to the full depth.  She tolerated this well without any pain or tenderness noted.  There does not appear any significant cavitation or significant volume of space along the depths of the tract.             - return to clinic on Thursday and as needed, instructed to call office if any questions or concerns  All of the above recommendations were discussed with the patient  and patient's family, and all of patient's and family's questions were answered to his/her/their expressed satisfaction.  I left a message at Dr. Evette Georges office, also able to recheck call the [phone].  Almyra Free will also attempt to make an appointment with them.  Ronny Bacon, MD, FACS : [facility]. Office: [phone]

## 2019-09-08 NOTE — Patient Instructions (Signed)
We will get in contact with Dr. Nilsa Nutting to get you an appointment to see him.   We placed packing in the wound area and we will see you Thursday to change it. Do not take the packing out until you come to the office.   Please see your appointment below. Call the office if you have any questions or concerns.

## 2019-09-10 ENCOUNTER — Encounter: Payer: Self-pay | Admitting: Surgery

## 2019-09-10 ENCOUNTER — Ambulatory Visit (INDEPENDENT_AMBULATORY_CARE_PROVIDER_SITE_OTHER): Payer: Medicare Other | Admitting: Surgery

## 2019-09-10 ENCOUNTER — Other Ambulatory Visit: Payer: Self-pay

## 2019-09-10 VITALS — BP 135/70 | HR 83 | Temp 97.6°F | Resp 14 | Ht 64.0 in | Wt 186.4 lb

## 2019-09-10 DIAGNOSIS — T148XXA Other injury of unspecified body region, initial encounter: Secondary | ICD-10-CM

## 2019-09-10 DIAGNOSIS — L02419 Cutaneous abscess of limb, unspecified: Secondary | ICD-10-CM | POA: Diagnosis not present

## 2019-09-10 DIAGNOSIS — L03119 Cellulitis of unspecified part of limb: Secondary | ICD-10-CM

## 2019-09-10 MED ORDER — CEPHALEXIN 500 MG PO CAPS: 500.0000 mg | ORAL_CAPSULE | Freq: Four times a day (QID) | ORAL | 0 refills | Status: AC

## 2019-09-10 NOTE — Patient Instructions (Addendum)
We took the old packing out of the wound and repacked it and placed a dry gauze over the area. Pull out about an inch everyday until everything is out.   We scheduled you for a CT of the R hip with Contrast. Appointment: Tuesday 09/22/19 at 10:30am at [facility] Address: [address], [place],  [address]. Arrival is 9:45am NOTHING TO DRINK AND EAT 4 hours PRIOR to appointment.   We sent in an antibiotic (Keflex) to your pharmacy.   You will return on Tuesday for a follow up. Please see you appointments below.

## 2019-09-10 NOTE — Progress Notes (Signed)
Surgical Clinic Progress/Follow-up Note   HPI:  86 y.o. Female presents to clinic for right hip wound follow-up.  #2 days follow the last evaluation.  Where I placed 1/4 inch packing strip to full depth of 9 cm via her right lateral hip wound.  Patient reports some slight tenderness/discomfort in that region with increased drainage.  She is change the dressing on multiple occasions.  She denies N/V, fever/chills, CP, or SOB.  We have made an appointment with her orthopedist for the 24th.  But she seems a little more distraught today.  Review of Systems:  Constitutional: denies fever/chills  ENT: denies sore throat, hearing problems  Respiratory: denies shortness of breath, wheezing  Cardiovascular: denies chest pain, palpitations  Gastrointestinal: denies abdominal pain, N/V, or diarrhea/and bowel function. Skin: Denies any other rashes or skin discolorations. Vital Signs:  BP 135/70   Pulse 83   Temp 97.6 F (36.4 C) (Temporal)   Resp 14   Ht 5\' 4"  (1.626 m)   Wt 186 lb 6.4 oz (84.6 kg)   SpO2 98%   BMI 32.00 kg/m    Physical Exam:  Constitutional:  -- Normal/Obese body habitus  -- Awake, alert, and oriented x3  Pulmonary:  -- No crackles -- Equal breath sounds bilaterally -- Breathing non-labored at rest Cardiovascular:  -- S1, S2 present  -- No pericardial rubs  Gastrointestinal:  -- Soft and non-distended, no guarding/rebound tenderness --Musculoskeletal / Integumentary:  -- Wounds the right lateral hip opening seems better established, I removed the quarter inch packing strip to find it much older than I expected.  It was also saturated with purulent appearing discharge. After Betadine prep I probed the wound tract with a Q-tip and obtained some additional drainage but not much more.  I probed it a second time to the same depth to ensure that there was no additional drainage present.  She has no CAL L OR, erythema, or tenderness on palpation.  I cannot appreciate any  focal tenderness. She denies any change in her hip mobility, or discomfort or pain.  Laboratory studies: None.  Imaging: No new pertinent imaging available for review   Assessment:  86 y.o. yo Female with a problem list including...  Patient Active Problem List   Diagnosis Date Noted  . Nonhealing nonsurgical wound 04/24/2019  . Senile purpura ([place]) 04/24/2019  . CKD (chronic kidney disease) 06/08/2015  . Clinical depression 06/08/2015  . BP (high blood pressure) 06/08/2015  . Cannot sleep 06/08/2015  . Adenopathy 06/08/2015  . Venous lake 06/08/2015  . Adynamia 06/08/2015  . Polymyalgia rheumatica ([place]) 05/27/2013  . Difficulty hearing 07/08/2009  . Accumulation of fluid in tissues 02/14/2009  . Menopausal and perimenopausal disorder 02/14/2009  . Excessive urination at night 02/14/2009  . 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 Colon, diverticulosis 08/19/2003  . Diaphragmatic hernia 08/19/2003  . Female genuine stress incontinence 08/19/2002  . Arthritis, degenerative 01/20/2001  . Adiposity 12/21/1994    presents to clinic for follow-up evaluation of nonhealing right lateral hip sinus tract, progressing, however this mild setback may be due to a relative plugging effect of the packing strip.  I utilized a single width of the quarter inch packing strip to ensure that there would be no redundant packing strip that might plug the wound opening.  I have also advised that she retract the packing strip by inches daily until it is out over the weekend.  Hopefully this will prevent any further plugging of this site and allow  adequate drainage.  We also discussed beginning oral antibiotics, and I have sent a specimen for culture and sensitivity.  I do not believe I can get an MRI in the midst of her hardware, so I have a ordered a right hip CT scan which unfortunately will not be completed for over a week. In the interim I will see her again next week.  Plan:              -  instructed to call office if any questions or concerns Keflex 500 mg p.o. 4 times daily for 10 days.  We will ensure that her wound remains draining.  We will also follow-up on the cultures. All of the above recommendations were discussed with the patient, and all of patient's  questions were answered to her expressed satisfaction.  Ronny Bacon, MD, FACS [place]: [facility]. Office: [phone]

## 2019-09-15 ENCOUNTER — Ambulatory Visit: Payer: 59 | Admitting: Surgery

## 2019-09-15 LAB — ANAEROBIC AND AEROBIC CULTURE

## 2019-09-17 ENCOUNTER — Ambulatory Visit: Payer: 59 | Admitting: Surgery

## 2019-09-17 ENCOUNTER — Ambulatory Visit: Payer: Medicare Other | Admitting: Surgery

## 2019-09-22 ENCOUNTER — Ambulatory Visit
Admission: RE | Admit: 2019-09-22 | Discharge: 2019-09-22 | Disposition: A | Discharge status: Home / Self Care | Payer: Medicare Other | Source: Ambulatory Visit | Attending: Surgery | Admitting: Surgery

## 2019-09-22 ENCOUNTER — Encounter: Payer: Self-pay | Admitting: Surgery

## 2019-09-22 ENCOUNTER — Other Ambulatory Visit: Payer: Self-pay

## 2019-09-22 ENCOUNTER — Ambulatory Visit (INDEPENDENT_AMBULATORY_CARE_PROVIDER_SITE_OTHER): Payer: Medicare Other | Admitting: Surgery

## 2019-09-22 VITALS — BP 132/82 | HR 89 | Temp 97.2°F | Resp 12 | Ht 63.0 in | Wt 184.0 lb

## 2019-09-22 DIAGNOSIS — T148XXA Other injury of unspecified body region, initial encounter: Secondary | ICD-10-CM | POA: Diagnosis not present

## 2019-09-22 DIAGNOSIS — L03119 Cellulitis of unspecified part of limb: Secondary | ICD-10-CM | POA: Diagnosis not present

## 2019-09-22 DIAGNOSIS — L02419 Cutaneous abscess of limb, unspecified: Secondary | ICD-10-CM | POA: Diagnosis not present

## 2019-09-22 DIAGNOSIS — Z96642 Presence of left artificial hip joint: Secondary | ICD-10-CM | POA: Diagnosis not present

## 2019-09-22 DIAGNOSIS — L03116 Cellulitis of left lower limb: Secondary | ICD-10-CM | POA: Diagnosis not present

## 2019-09-22 HISTORY — DX: Essential (primary) hypertension: I10

## 2019-09-22 LAB — POCT I-STAT CREATININE: Creatinine, Ser: 1 mg/dL (ref 0.44–1.00)

## 2019-09-22 MED ORDER — IOHEXOL 300 MG/ML  SOLN
100.0000 mL | Freq: Once | INTRAMUSCULAR | Status: AC | PRN
  Administered 2019-09-22: 10:00:00 75 mL via INTRAVENOUS

## 2019-09-22 NOTE — Progress Notes (Signed)
This 86 year old Caucasian female returns today for follow-up of the sinus tract from her right hip region.  She recently had her CT imaging completed and is anxious to have the results.  However at this time report is still pending.  I have looked at the imaging and I believe I can see the tract extending down to the TFL.  There seems to be a bit more space at that fascial junction and may track toward the greater trochanter. But I admit I am not used to looking at CTs of this region.  And the hardware does add some artifact. She reports that the wicking is long gone, and she is gradually seen diminished drainage.  The dressing present was from yesterday and she had some degree of pussy appearing drainage on it may be a tablespoon or 2. She is having no tenderness, she ambulates well and there is no appearance externally of any induration or new edema.  There is no erythema at all. I prepped the skin around the site with Betadine and probed it with 2 sterile cotton tip applicators and found no additional drainage.  I did not replace any wicking or packing. She completed her course of antibiotics and felt it improved, her cultures did not show anything. Once again we discussed our options in regard to the goals that we have been seeking to achieve. She has an appointment with her orthopedist surgeon in 2 days, we have advised her how to ensure that her imaging from today is available for him.  She will continue to apply dry dressing in the meantime.  And she is aware that I will turn over her care to Dr. Claiborne Billings for definitive management of the sinus tract/abscess cavity.  I will be glad to assist in any way I can.

## 2019-09-22 NOTE — Patient Instructions (Addendum)
Dr.Rodenberg discussed the CT scan with patient. He has not yet seen the final report. Patient was advised to contact Radiology department that she may requested to have her images copied onto a disc. So, she will have the images to take to her appointment Thursday.   Dr.Rodenberg opened the wound today and covered it with the gauze and secured it with a piece of tape.   Patient was given gauze to have to use at home to change the wound.

## 2019-09-24 DIAGNOSIS — M25551 Pain in right hip: Secondary | ICD-10-CM | POA: Diagnosis not present

## 2019-09-24 DIAGNOSIS — G8929 Other chronic pain: Secondary | ICD-10-CM | POA: Diagnosis not present

## 2019-09-24 DIAGNOSIS — Z96641 Presence of right artificial hip joint: Secondary | ICD-10-CM | POA: Diagnosis not present

## 2019-09-29 DIAGNOSIS — M25551 Pain in right hip: Secondary | ICD-10-CM | POA: Diagnosis not present

## 2019-09-29 DIAGNOSIS — G8929 Other chronic pain: Secondary | ICD-10-CM | POA: Diagnosis not present

## 2019-09-29 DIAGNOSIS — Z96641 Presence of right artificial hip joint: Secondary | ICD-10-CM | POA: Diagnosis not present

## 2019-10-02 DIAGNOSIS — Z96641 Presence of right artificial hip joint: Secondary | ICD-10-CM | POA: Insufficient documentation

## 2019-10-07 DIAGNOSIS — Z96641 Presence of right artificial hip joint: Secondary | ICD-10-CM | POA: Diagnosis not present

## 2019-10-07 DIAGNOSIS — M25461 Effusion, right knee: Secondary | ICD-10-CM | POA: Diagnosis not present

## 2019-10-07 DIAGNOSIS — T8453XA Infection and inflammatory reaction due to internal right knee prosthesis, initial encounter: Secondary | ICD-10-CM | POA: Diagnosis not present

## 2019-10-07 DIAGNOSIS — Z9889 Other specified postprocedural states: Secondary | ICD-10-CM | POA: Diagnosis not present

## 2019-10-07 DIAGNOSIS — T8451XA Infection and inflammatory reaction due to internal right hip prosthesis, initial encounter: Secondary | ICD-10-CM | POA: Diagnosis not present

## 2019-10-15 ENCOUNTER — Ambulatory Visit: Payer: Medicare Other | Admitting: Family Medicine

## 2019-10-16 ENCOUNTER — Ambulatory Visit: Payer: Medicare Other | Admitting: Family Medicine

## 2019-10-20 ENCOUNTER — Other Ambulatory Visit: Payer: Self-pay

## 2019-10-20 ENCOUNTER — Encounter: Payer: Self-pay | Admitting: Family Medicine

## 2019-10-20 ENCOUNTER — Ambulatory Visit (INDEPENDENT_AMBULATORY_CARE_PROVIDER_SITE_OTHER): Payer: Medicare Other | Admitting: Family Medicine

## 2019-10-20 VITALS — BP 135/85 | HR 66 | Temp 96.6°F | Resp 16 | Wt 184.0 lb

## 2019-10-20 DIAGNOSIS — T8452XD Infection and inflammatory reaction due to internal left hip prosthesis, subsequent encounter: Secondary | ICD-10-CM

## 2019-10-20 NOTE — Progress Notes (Signed)
Patient: Tina [name] Female    DOB: 1932-11-12   86 y.o.   MRN: [id number] Visit Date: 10/20/2019  Today's Provider: Lavon Paganini, MD   No chief complaint on file.  Subjective:     HPI Patient here today to go over treatment for right hip. Patient was last seen by Dr. Warnell Forester, orthopaedics on 03/018/2021. Patient has also been treated for draining sinus for about 1 year. Has seen wound care, general surgeon and dermatologist.   After Gen surg saw that sinus tract involved R hip that was previously replaced, she was referred back to Dr Georgina Peer ([facility]) who had done the joint replacement previously.  He reviewed the CT and was concerned that there could be some periprosthetic involvement.  ESR and CRP were elevated, so she was sent for aspiration of the right hip joint under image guidance.  Culture of that fluid showed MRSA infection.  During our conversation with Dr. Warnell Forester on 10/15/2019, she was given options for treatment going forward which included,  "Tina [name] is an 86 year old woman who underwent right total hip arthroplasty on June 01, 2013. She has had a draining sinus for approximately 1 year that has been treated with local wound care by a general surgeon and dermatologist.She was recently evaluated in our clinic. Her ESR and CRP were elevated so a subsequent aspiration of the right hip was performed. The aspirate culture was positive for methicillin resistant staph aureus (MRSA).We discussed by phone with patient, that she has a periprosthetic joint infection and this would not be completely eradicated without surgery. We discussed that surgery and recovery would involve a high chance or morbidity.  We discussed that she has the following two options moving forward. 1 Observation. We discussed that her sinus tract will likely not heal and that there is a chance her infection could progress to cause pain in her hip and or cause systemic  sickness. We also discussed that it would be beneficial to be evaluated by an infectious disease physician to determine if antibiotic suppression would be beneficial.  2 Surgery. This would involved removing the entire prosthesis,We discussed that because the implants are likely well fixed, removal would likely involve an osteotomy. After implant removal and thorough cleaning of the joint , there are two options: placing an articulating spacer versus leaving her with a resection. We would lean toward spacer, allowing [place] to replant her joint arthroplasty later in the year. If conditions would not favor this approach, we would leave her without an implant and what would essentially be a flail hip.We discussed that surgical recovery would be difficult with both approaches and it is possible she will have pain after the surgery. Her ideal outcome would be to eventually place a new joint replacement that is free of infection -- the odds of accomplishing that are around 85%.  Given that she is 86 year old she is very hesitant about pursuing a large surgery. She voiced good understanding of both of her options. The patient, expressed that she is completely pain free and is hesitant at her age to undergo surgery.  She would like to discuss these options with her family and decide what to do at a later time."  Patient states that she is confused about the different people involved in her care.  She states that when she called Dr. Evette Georges office, she was told that Dr. Warnell Forester did not work there.  She states that he calls her  late in the evening and one time it was from a number that showed as lounge on her caller ID.  We discussed that he appears to be an orthopedic fellow at [facility] working with Dr. Claiborne Billings and that he likely had to call her from the physician lounge at the hospital after a day of surgeries.   BP Readings from Last 3 Encounters:  10/20/19 135/85  09/22/19 132/82  09/10/19 135/70      Allergies  Allergen Reactions  . Erythromycin      Current Outpatient Medications:  .  lisinopril (ZESTRIL) 20 MG tablet, Take 1 tablet (20 mg total) by mouth daily., Disp: 90 tablet, Rfl: 4 .  Melatonin 5 MG TABS, Take by mouth at bedtime as needed. , Disp: , Rfl:  .  metoprolol tartrate (LOPRESSOR) 50 MG tablet, Take 1 tablet (50 mg total) by mouth 2 (two) times daily., Disp: 180 tablet, Rfl: 4 .  Multiple Vitamin (MULTIVITAMIN) tablet, Take 1 tablet by mouth daily., Disp: , Rfl:  .  predniSONE (DELTASONE) 5 MG tablet, Take 1 tablet (5 mg total) by mouth daily., Disp: 90 tablet, Rfl: 4  Review of Systems  Constitutional: Negative.   Respiratory: Negative.   Cardiovascular: Negative.   Genitourinary: Negative.   Musculoskeletal: Negative for arthralgias, gait problem and joint swelling.  Skin: Positive for wound.  Neurological: Negative.   Psychiatric/Behavioral: Negative.     Social History   Tobacco Use  . Smoking status: Former Smoker    Quit date: 07/30/1979    Years since quitting: 40.2  . Smokeless tobacco: Never Used  Substance Use Topics  . Alcohol use: No    Comment: 0-1 scotch drink(s) a month      Objective:   BP 135/85 (BP Location: Left Arm, Patient Position: Sitting, Cuff Size: Normal)   Pulse 66   Temp (!) 96.6 F (35.9 C) (Temporal)   Resp 16   Wt 184 lb (83.5 kg)   BMI 32.59 kg/m  Vitals:   10/20/19 1114  BP: 135/85  Pulse: 66  Resp: 16  Temp: (!) 96.6 F (35.9 C)  TempSrc: Temporal  Weight: 184 lb (83.5 kg)  Body mass index is 32.59 kg/m.   Physical Exam Constitutional:      General: She is not in acute distress.    Appearance: Normal appearance. She is not diaphoretic.  Cardiovascular:     Rate and Rhythm: Normal rate and regular rhythm.     Heart sounds: Normal heart sounds.  Pulmonary:     Effort: Pulmonary effort is normal. No respiratory distress.     Breath sounds: Normal breath sounds. No wheezing.  Neurological:      Mental Status: She is alert and oriented to person, place, and time. Mental status is at baseline.  Psychiatric:        Mood and Affect: Mood normal.        Behavior: Behavior normal.      No results found for any visits on 10/20/19.     Assessment & Plan    1. 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 Warnell Forester does work with Dr. Claiborne Billings and that he is involved in her care, though this was not explained well to her -We went back over the options that were given to her by orthopedics -Given her advanced age, she  does not feel that a large and complicated surgery is the right course of action for her -She knows that it will never be fully eradicated without surgery -She would like to look into seeing infectious disease to discuss possible medical management of this -She states she was told by [facility] that she would need to see a specific infectious disease physician at [facility] and that she could not see anyone locally, but transportation is an issue for her and she cannot drive out of town by herself -We will start with referral to local infectious disease and let them determine if they feel like she needs to see someone at an academic [facility] - Ambulatory referral to Infectious Disease   Return in about 4 weeks (around 11/17/2019) for chronic disease f/u.  Total time spent on today's visit was greater than 30 minutes, including both face-to-face time and nonface-to-face time personally spent on review of chart (labs and imaging), discussing labs and goals, discussing further work-up, treatment options, referrals to specialist if needed, reviewing outside records of pertinent, answering patient's questions, and coordinating care.    The entirety of the information documented in the  History of Present Illness, Review of Systems and Physical Exam were personally obtained by me. Portions of this information were initially documented by Lynford Humphrey, CMA and reviewed by me for thoroughness and accuracy.    , Dionne Bucy, MD MPH [facility]

## 2019-10-27 ENCOUNTER — Telehealth: Payer: Self-pay

## 2019-10-27 NOTE — Telephone Encounter (Signed)
Copied from [place] [id number]. Topic: General - Other >> Oct 27, 2019  9:10 AM Celene Kras wrote: Reason for CRM: Pt called and is requesting to have someone call her regarding the referral that was placed with the orthopedic doctor. Please advise.

## 2019-10-27 NOTE — Telephone Encounter (Signed)
Pt stated she would like to have her referral changed from Dr. Ola Spurr to Oswald Hillock at [facility]. Please advise. Dr. Ola Spurr could not accommodate pt.

## 2019-10-27 NOTE — Telephone Encounter (Signed)
Please review. Let me know if we need to put in a new referral. Thank you.

## 2019-10-27 NOTE — Telephone Encounter (Signed)
Referral was sent to Dr Tsosie Billing 10/20/19.I gave pt their contact information to call their office to schedule

## 2019-10-27 NOTE — Telephone Encounter (Signed)
Patient called to follow up on referral. Patient does want to be seen here in town. And it looks like she wants to see Dr. Ola Spurr at [facility].

## 2019-10-28 ENCOUNTER — Ambulatory Visit: Payer: Medicare Other | Admitting: Family Medicine

## 2019-11-02 ENCOUNTER — Telehealth: Payer: Self-pay

## 2019-11-02 NOTE — Telephone Encounter (Signed)
Copied from [place] [id number]. Topic: General - Other >> Nov 02, 2019  3:19 PM Mcneil, Ja-Kwan wrote: Reason for CRM: Pt stated she needs the authorization to [employer] for her colostomy supplies to be sent.

## 2019-11-03 NOTE — Telephone Encounter (Signed)
Called and spoke with the patient. She stated she need a prior authorization to receive her colostomy supplies. I contacted [employer] at [phone] and they have faxed a prior auth form to the office and also requested visit notes for needing the supplies.

## 2019-11-03 NOTE — Telephone Encounter (Signed)
What kind of authorization does she need? If it is a verbal, please give it. If it is a prescription, can we contact the company to get a list of the supplies that need to be written for?

## 2019-11-03 NOTE — Telephone Encounter (Signed)
Please review for Dr. B ° ° °Thanks,  ° °-Camila Maita  °

## 2019-11-04 NOTE — Telephone Encounter (Signed)
   Received fax, signed by Fabio Bering and faxed back to company

## 2019-11-10 ENCOUNTER — Other Ambulatory Visit
Admission: RE | Admit: 2019-11-10 | Discharge: 2019-11-10 | Disposition: A | Discharge status: Home / Self Care | Payer: Medicare Other | Source: Ambulatory Visit | Attending: Infectious Diseases | Admitting: Infectious Diseases

## 2019-11-10 ENCOUNTER — Encounter: Payer: Self-pay | Admitting: Infectious Diseases

## 2019-11-10 ENCOUNTER — Other Ambulatory Visit: Payer: Self-pay

## 2019-11-10 ENCOUNTER — Ambulatory Visit: Payer: Medicare Other | Attending: Infectious Diseases | Admitting: Infectious Diseases

## 2019-11-10 VITALS — BP 118/71 | HR 65 | Temp 97.4°F | Resp 16 | Ht 63.0 in | Wt 184.0 lb

## 2019-11-10 DIAGNOSIS — X58XXXA Exposure to other specified factors, initial encounter: Secondary | ICD-10-CM | POA: Diagnosis not present

## 2019-11-10 DIAGNOSIS — T8450XA Infection and inflammatory reaction due to unspecified internal joint prosthesis, initial encounter: Secondary | ICD-10-CM | POA: Diagnosis not present

## 2019-11-10 DIAGNOSIS — L03119 Cellulitis of unspecified part of limb: Secondary | ICD-10-CM

## 2019-11-10 DIAGNOSIS — L02419 Cutaneous abscess of limb, unspecified: Secondary | ICD-10-CM

## 2019-11-10 NOTE — Progress Notes (Signed)
NAME: Tina [name]  DOB: 06/30/1933  MRN: [id number]  Date/Time: 11/10/2019 11:32 AM  REQUESTING PROVIDER:. Dr. Brita Romp Subjective:  REASON FOR CONSULT: Chronic wound right thigh ?Patient is here with her son  Tina [name] is a 86 y.o. female with a history of hypertension, right hip replacement in 2014, total knee replacement left in 2004 is here for chronic wound on the right thigh. Patient noted a right thigh wound discharging a year and a half ago. Since the past year or so patient has noted a small wound on the scar of the previous surgery and from where yellow fluid has been draining periodically.  This was initially noted by her PCP on 09/09/2018 and it was described as a cyst visible for the past 2 weeks reporting pain tenderness and drainage from the area.  She is given a 5-day course of antibiotics with Bactrim.  Because of diarrhea the Bactrim was changed to doxycycline.  She later saw the surgeon Dr. Milas Gain and he was packing the wound.  He did see in the cultures on 09/10/2019 which was negative.  She had a CT scan of the right hip on 09/22/2019.  It was read by musculoskeletal radiologist Dr. Posey Pronto who mentioned that there was right hip arthroplasty without hardware failure or complication.  As the wound was not healing she was eventually referred to the orthopedic surgeon who did the right hip replacement and she saw Dr. Claiborne Billings on 09/24/2019.  She was referred for right hip arthrocentesis which was done on 10/07/2019.  The culture of the synovial fluid was rare growth of Staphylococcus epidermidis.  There was no WBCs seen.  It was resistant to oxacillin. As she was not a candidate for extensive surgery Dr. Claiborne Billings referred her to infectious disease. Patient is does not have any fever.  She is independent.  She still drives a car.  She does not want any invasive surgery.  She would like this to be managed as conservatively as possible. She has a colostomy which was done in  November 2014 for perforated diverticulitis .   Past Medical History:  Diagnosis Date  . Hypertension     Past Surgical History:  Procedure Laterality Date  . ABDOMINAL HYSTERECTOMY  1980's  . COLON SURGERY  06/08/13   s/p ruptured diverticulitis, has fistula; had TPN for 3 mths  . DILATION AND CURETTAGE OF UTERUS  1970's  . HIP SURGERY Right 06/01/2013  . KNEE SURGERY Left 2004   total knee replacement      Social history Lives on her own Was music performer and teacher Ex-smoker Occasional alcohol    Family History  Problem Relation Age of Onset  . Cancer Mother        breast   Allergies  Allergen Reactions  . Erythromycin     Current Outpatient Medications  Medication Sig Dispense Refill  . lisinopril (ZESTRIL) 20 MG tablet Take 1 tablet (20 mg total) by mouth daily. 90 tablet 4  . Melatonin 5 MG TABS Take by mouth at bedtime as needed.     . metoprolol tartrate (LOPRESSOR) 50 MG tablet Take 1 tablet (50 mg total) by mouth 2 (two) times daily. 180 tablet 4  . Multiple Vitamin (MULTIVITAMIN) tablet Take 1 tablet by mouth daily.    . predniSONE (DELTASONE) 5 MG tablet Take 1 tablet (5 mg total) by mouth daily. 90 tablet 4   No current facility-administered medications for this visit.     Abtx:  Anti-infectives (From admission,  onward)   None      REVIEW OF SYSTEMS:  Const: negative fever, negative chills, negative weight loss Eyes: negative diplopia or visual changes, negative eye pain ENT: negative coryza, negative sore throat Resp: negative cough, hemoptysis, dyspnea Cards: negative for chest pain, palpitations, lower extremity edema GU: negative for frequency, dysuria and hematuria GI: Has colostomy Skin: negative for rash and pruritus Heme: negative for easy bruising and gum/nose bleeding MS: negative for myalgias, arthralgias, back pain and muscle weakness Neurolo:negative for headaches, dizziness, vertigo, memory problems  Psych: negative for  feelings of anxiety, depression  Endocrine: No thyroid issues Allergy/Immunology-erythromycin Objective:  VITALS:  BP 118/71   Pulse 65   Temp (!) 97.4 F (36.3 C) (Oral)   Resp 16   Ht 5' 3" (1.6 m)   Wt 184 lb (83.5 kg)   SpO2 95%   BMI 32.59 kg/m  PHYSICAL EXAM:  Examination limited as patient in wheelchair General: Alert, cooperative, no distress, appears stated age.  Head: Normocephalic, without obvious abnormality, atraumatic. Eyes: Conjunctivae clear, anicteric sclerae. Pupils are equal ENT did not examine as she has a mask Neck: Supple, Back: No CVA tenderness. Lungs: Clear to auscultation bilaterally. No Wheezing or Rhonchi. No rales. Heart: Regular rate and rhythm, no murmur, rub or gallop. Abdomen: Did not examine Extremities: Right lateral thigh a (scar at the end of the scar is small opening with some mucoid discharge     skin: No rashes or lesions. Or bruising Edema legs Lymph: Cervical, supraclavicular normal. Neurologic: Grossly non-focal Pertinent Labs Lab Results CBC    Component Value Date/Time   WBC 8.6 12/26/2017 1410   WBC 7.3 10/09/2015 1946   RBC 4.30 12/26/2017 1410   RBC 4.17 10/09/2015 1946   HGB 13.3 12/26/2017 1410   HCT 40.1 12/26/2017 1410   PLT 314 12/26/2017 1410   MCV 93 12/26/2017 1410   MCV 89 12/02/2013 0028   MCH 30.9 12/26/2017 1410   MCH 32.0 10/09/2015 1946   MCHC 33.2 12/26/2017 1410   MCHC 33.2 10/09/2015 1946   RDW 14.2 12/26/2017 1410   RDW 17.7 (H) 12/02/2013 0028   LYMPHSABS 0.8 12/26/2017 1410   LYMPHSABS 1.5 12/02/2013 0028   MONOABS 0.6 10/09/2015 1946   MONOABS 0.7 12/02/2013 0028   EOSABS 0.1 12/26/2017 1410   EOSABS 0.1 12/02/2013 0028   BASOSABS 0.0 12/26/2017 1410   BASOSABS 0.1 12/02/2013 0028    CMP Latest Ref Rng & Units 09/22/2019 04/24/2019 11/13/2017  Glucose 65 - 99 mg/dL - 96 124(H)  BUN 8 - 27 mg/dL - 34(H) 34(H)  Creatinine 0.44 - 1.00 mg/dL 1.00 1.09(H) 1.06(H)  Sodium 134 - 144 mmol/L -  140 141  Potassium 3.5 - 5.2 mmol/L - 5.0 4.5  Chloride 96 - 106 mmol/L - 102 101  CO2 20 - 29 mmol/L - 25 23  Calcium 8.7 - 10.3 mg/dL - 9.5 9.5  Total Protein 6.0 - 8.5 g/dL - 6.2 -  Total Bilirubin 0.0 - 1.2 mg/dL - 0.2 -  Alkaline Phos 39 - 117 IU/L - 74 -  AST 0 - 40 IU/L - 21 -  ALT 0 - 32 IU/L - 10 -      Microbiology:  IMAGING RESULTS: CT scan done in March reviewed  I have personally reviewed the films ? Impression/Recommendation ? Right total hip arthroplasty in 2014 Sinus fistulous wound since 1 year.  This is right on the distal end of the previous surgical scar.  Even though  CT scan does not show any hardware involvement or loosening of hardware very likely this is a fistulous tract which is extending into the area.  She had arthrocentesis of the right hip and the fluid was positive for staph epidermidis.  This is not MRSA.  Staph epi is a skin bacteria and and usually causes an indolent infection especially with hardware in place. She does not want to have extensive surgical intervention like removing the hardware. She would like to control the infection with antibiotics if possible.  She prefers oral antibiotic and stop IV antibiotic. Today we will do a culture and confirm it is still staph epidermidis.  We will also ask the lab to do susceptibility for other antibiotics including oral antibiotics. ( Bactrim, Doxy and linezolid) Last ESR was 76 and CRP 9 from 09/29/2019.  ? ? Hypertension on metoprolol and lisinopril  She is also on low-dose prednisone and I forgot to discuss that with her. ___________________________________________________ Discussed with patient, and her son in great detail. We will contact them once I have the culture results to initiate oral antibiotics. We will follow her

## 2019-11-10 NOTE — Patient Instructions (Signed)
You are her for a sinus at the site of the rt hip replacement- let us do a culture and then decide on oral antibiotics Will call you with the results and plan for antibiotic- once you go on antibiotic will monitor your labs

## 2019-11-13 LAB — AEROBIC CULTURE W GRAM STAIN (SUPERFICIAL SPECIMEN): Culture: NO GROWTH

## 2019-11-13 NOTE — Telephone Encounter (Signed)
Is this okay to order?  Thanks,   -Mickel Baas

## 2019-11-13 NOTE — Telephone Encounter (Signed)
Pt called in and stated she also needs the pouches [id number] and Skin prep [id number])   She stated that she also needs these faxed over.   Please advise

## 2019-11-16 ENCOUNTER — Telehealth: Payer: Self-pay

## 2019-11-16 ENCOUNTER — Other Ambulatory Visit: Payer: Self-pay | Admitting: Infectious Diseases

## 2019-11-16 MED ORDER — DOXYCYCLINE HYCLATE 100 MG PO TABS: 100.0000 mg | ORAL_TABLET | Freq: Two times a day (BID) | ORAL | 1 refills | Status: DC

## 2019-11-16 NOTE — Telephone Encounter (Signed)
Yes ok to order  

## 2019-11-16 NOTE — Telephone Encounter (Signed)
Order has been faxed to [employer] at [phone]. KW

## 2019-11-16 NOTE — Progress Notes (Signed)
Wound Culture Neg. Doxy 100mg  BID for 2 weeks - depending on the response and if no side effects will continue

## 2019-11-16 NOTE — Telephone Encounter (Signed)
Dr. Brita Romp can you write this order on a script pad so I can fax? KW

## 2019-11-16 NOTE — Telephone Encounter (Signed)
Advised : culture from the rt thigh wound no growth. But she had a positive hip fluid culture from 3/10. Tell ehr I will send Doxycycline 100mg  Twice a day for 2 weeks and if she has no side effects can continue for anohter 4 weeks if needed-   Sent to [facility]  Pt to call in two weeks to check if we go another 4 weeks.

## 2019-11-16 NOTE — Telephone Encounter (Signed)
Done. Ready to fax

## 2019-11-17 NOTE — Progress Notes (Signed)
Established patient visit    Patient: Tina [name]   DOB: 08/11/1932   86 y.o. Female  MRN: [id number] Visit Date: 11/23/2019  Today's healthcare provider: Lavon Paganini, MD   Chief Complaint  Patient presents with  . Hypertension  . Wound Infection   Subjective    HPI  Patient here today with her son Marisela Sharbaugh.  Hypertension, follow-up:  BP Readings from Last 3 Encounters:  11/23/19 130/65  11/10/19 118/71  10/20/19 135/85    She was last seen for hypertension 6 months ago.  BP at that visit was 135/70. Management changes since that visit include no changes. She reports excellent compliance with treatment. She is not having side effects.  She is not exercising. She is not adherent to low salt diet.   Outside blood pressures are not being checked. She is experiencing lower extremity edema.  Patient denies chest pain, dyspnea and palpitations.   Cardiovascular risk factors include advanced age (older than 72 for men, 7 for women), hypertension and obesity (BMI >= 30 kg/m2).  Use of agents associated with hypertension: none.     Weight trend: stable Wt Readings from Last 3 Encounters:  11/23/19 184 lb (83.5 kg)  11/10/19 184 lb (83.5 kg)  10/20/19 184 lb (83.5 kg)    Current diet: in general, a "healthy" diet    ------------------------------------------------------------------------ Patient reports Dr. Delaine Lame started patient on Doxy 100 mg BID for 2 weeks. Patient is being being treated for infection of prosthetic joint. Patient is requesting refill on triamcinolone for rash around hip area.   She was taking prednisone for PMR for many years at low dose.  She also has nonarthritic rheumatoid disease.  No longer followed by Rheumatology.   Social History   Tobacco Use  . Smoking status: Former Smoker    Quit date: 07/30/1979    Years since quitting: 40.3  . Smokeless tobacco: Never Used  Substance Use Topics  . Alcohol use: No   Comment: 0-1 scotch drink(s) a month  . Drug use: No       Medications: Outpatient Medications Prior to Visit  Medication Sig  . diphenhydramine-acetaminophen (TYLENOL PM) 25-500 MG TABS tablet Take 1 tablet by mouth at bedtime as needed.  . doxycycline (VIBRA-TABS) 100 MG tablet Take 1 tablet (100 mg total) by mouth 2 (two) times daily.  Marland Kitchen lisinopril (ZESTRIL) 20 MG tablet Take 1 tablet (20 mg total) by mouth daily.  . metoprolol tartrate (LOPRESSOR) 50 MG tablet Take 1 tablet (50 mg total) by mouth 2 (two) times daily.  . Multiple Vitamin (MULTIVITAMIN) tablet Take 1 tablet by mouth daily.  . [DISCONTINUED] predniSONE (DELTASONE) 5 MG tablet Take 1 tablet (5 mg total) by mouth daily.  . [DISCONTINUED] Melatonin 5 MG TABS Take by mouth at bedtime as needed.    No facility-administered medications prior to visit.    Review of Systems  Constitutional: Negative.   Respiratory: Negative.   Cardiovascular: Positive for leg swelling.    Last metabolic panel Lab Results  Component Value Date   GLUCOSE 96 04/24/2019   NA 140 04/24/2019   K 5.0 04/24/2019   CL 102 04/24/2019   CO2 25 04/24/2019   BUN 34 (H) 04/24/2019   CREATININE 1.00 09/22/2019   GFRNONAA 47 (L) 04/24/2019   GFRAA 54 (L) 04/24/2019   CALCIUM 9.5 04/24/2019   PHOS 2.8 08/20/2013   PROT 6.2 04/24/2019   ALBUMIN 3.8 04/24/2019   LABGLOB 2.4 04/24/2019   AGRATIO  1.6 04/24/2019   BILITOT 0.2 04/24/2019   ALKPHOS 74 04/24/2019   AST 21 04/24/2019   ALT 10 04/24/2019   ANIONGAP 7 10/09/2015       Objective    BP 130/65 (BP Location: Right Arm, Patient Position: Sitting, Cuff Size: Large)   Pulse 71   Temp (!) 96.9 F (36.1 C) (Temporal)   Resp 16   Ht 5\' 2"  (1.575 m)   Wt 184 lb (83.5 kg)   BMI 33.65 kg/m  BP Readings from Last 3 Encounters:  11/23/19 130/65  11/10/19 118/71  10/20/19 135/85   Wt Readings from Last 3 Encounters:  11/23/19 184 lb (83.5 kg)  11/10/19 184 lb (83.5 kg)  10/20/19  184 lb (83.5 kg)      Physical Exam Vitals reviewed.  Constitutional:      General: She is not in acute distress.    Appearance: Normal appearance. She is well-developed. She is not diaphoretic.  HENT:     Head: Normocephalic and atraumatic.  Eyes:     General: No scleral icterus.    Conjunctiva/sclera: Conjunctivae normal.  Neck:     Thyroid: No thyromegaly.  Cardiovascular:     Rate and Rhythm: Normal rate and regular rhythm.     Pulses: Normal pulses.     Heart sounds: Normal heart sounds. No murmur.  Pulmonary:     Effort: Pulmonary effort is normal. No respiratory distress.     Breath sounds: Normal breath sounds. No wheezing, rhonchi or rales.  Musculoskeletal:     Cervical back: Neck supple.     Right lower leg: Edema present.     Left lower leg: Edema present.  Lymphadenopathy:     Cervical: No cervical adenopathy.  Skin:    General: Skin is warm and dry.     Findings: No rash.  Neurological:     Mental Status: She is alert and oriented to person, place, and time. Mental status is at baseline.  Psychiatric:        Mood and Affect: Mood normal.        Behavior: Behavior normal.     No results found for any visits on 11/23/19.   Assessment & Plan     Problem List Items Addressed This Visit      Cardiovascular and Mediastinum   BP (high blood pressure) - Primary    Well controlled Continue current medications Recheck metabolic panel F/u in 6 months       Relevant Orders   Basic metabolic panel   Senile purpura (HCC)    Chronic and stable Continue to monitor        Musculoskeletal and Integument   Prosthetic joint infection of left hip (HCC)    Stable. Continue Doxy Follow up with ID  Discussed immunosuppression with chronic prednisone - see plan below      Relevant Medications   triamcinolone ointment (KENALOG) 0.5 %     Genitourinary   CKD (chronic kidney disease)    Stable. Continue metoprolol Continue lisinopril      Relevant  Orders   Basic metabolic panel     Other   Female genuine stress incontinence    Worsening. Patient refused Pelvic Floor Therapy.      Polymyalgia rheumatica (HCC)    Stable. Patient advised to cut back on prednisone. Due to possible worsening of infection of prosthetic joint.  Will taper of slowly, change every 2-4 weeks. Decrease to 4 mg daily to start. Will follow PMR symptoms  Relevant Medications   predniSONE (DELTASONE) 1 MG tablet      Return in about 6 months (around 05/24/2020) for AWV.      I, Lavon Paganini, MD, have reviewed all documentation for this visit. The documentation on 11/23/19 for the exam, diagnosis, procedures, and orders are all accurate and complete.   Danea Manter, Dionne Bucy, MD, MPH [facility]

## 2019-11-23 ENCOUNTER — Ambulatory Visit (INDEPENDENT_AMBULATORY_CARE_PROVIDER_SITE_OTHER): Payer: Medicare Other | Admitting: Family Medicine

## 2019-11-23 ENCOUNTER — Other Ambulatory Visit: Payer: Self-pay

## 2019-11-23 ENCOUNTER — Encounter: Payer: Self-pay | Admitting: Family Medicine

## 2019-11-23 VITALS — BP 130/65 | HR 71 | Temp 96.9°F | Resp 16 | Ht 62.0 in | Wt 184.0 lb

## 2019-11-23 DIAGNOSIS — T8452XA Infection and inflammatory reaction due to internal left hip prosthesis, initial encounter: Secondary | ICD-10-CM | POA: Insufficient documentation

## 2019-11-23 DIAGNOSIS — T8452XD Infection and inflammatory reaction due to internal left hip prosthesis, subsequent encounter: Secondary | ICD-10-CM | POA: Diagnosis not present

## 2019-11-23 DIAGNOSIS — I10 Essential (primary) hypertension: Secondary | ICD-10-CM

## 2019-11-23 DIAGNOSIS — N393 Stress incontinence (female) (male): Secondary | ICD-10-CM

## 2019-11-23 DIAGNOSIS — N182 Chronic kidney disease, stage 2 (mild): Secondary | ICD-10-CM | POA: Diagnosis not present

## 2019-11-23 DIAGNOSIS — M353 Polymyalgia rheumatica: Secondary | ICD-10-CM

## 2019-11-23 DIAGNOSIS — R32 Unspecified urinary incontinence: Secondary | ICD-10-CM | POA: Insufficient documentation

## 2019-11-23 DIAGNOSIS — D692 Other nonthrombocytopenic purpura: Secondary | ICD-10-CM | POA: Diagnosis not present

## 2019-11-23 MED ORDER — TRIAMCINOLONE ACETONIDE 0.5 % EX OINT: 1.0000 "application " | TOPICAL_OINTMENT | Freq: Two times a day (BID) | CUTANEOUS | 0 refills | Status: DC

## 2019-11-23 MED ORDER — PREDNISONE 1 MG PO TABS: 4.0000 mg | ORAL_TABLET | Freq: Every day | ORAL | 0 refills | Status: DC

## 2019-11-23 NOTE — Assessment & Plan Note (Signed)
Worsening. Patient refused Pelvic Floor Therapy.

## 2019-11-23 NOTE — Assessment & Plan Note (Signed)
Chronic and stable Continue to monitor 

## 2019-11-23 NOTE — Assessment & Plan Note (Addendum)
Stable. Patient advised to cut back on prednisone. Due to possible worsening of infection of prosthetic joint.  Will taper of slowly, change every 2-4 weeks. Decrease to 4 mg daily to start. Will follow PMR symptoms

## 2019-11-23 NOTE — Assessment & Plan Note (Signed)
Well controlled Continue current medications Recheck metabolic panel F/u in 6 months  

## 2019-11-23 NOTE — Assessment & Plan Note (Addendum)
Stable. Continue Doxy Follow up with ID  Discussed immunosuppression with chronic prednisone - see plan below

## 2019-11-23 NOTE — Assessment & Plan Note (Signed)
Stable. Continue metoprolol Continue lisinopril

## 2019-11-24 ENCOUNTER — Telehealth: Payer: Self-pay

## 2019-11-24 LAB — BASIC METABOLIC PANEL
BUN/Creatinine Ratio: 35 — ABNORMAL HIGH (ref 12–28)
BUN: 35 mg/dL — ABNORMAL HIGH (ref 8–27)
CO2: 21 mmol/L (ref 20–29)
Calcium: 9.8 mg/dL (ref 8.7–10.3)
Chloride: 105 mmol/L (ref 96–106)
Creatinine, Ser: 0.99 mg/dL (ref 0.57–1.00)
GFR calc Af Amer: 60 mL/min/{1.73_m2} (ref >59)
GFR calc non Af Amer: 52 mL/min/{1.73_m2} — ABNORMAL LOW (ref >59)
Glucose: 99 mg/dL (ref 65–99)
Potassium: 5 mmol/L (ref 3.5–5.2)
Sodium: 142 mmol/L (ref 134–144)

## 2019-11-24 NOTE — Telephone Encounter (Signed)
Patient advised as directed below. 

## 2019-11-24 NOTE — Telephone Encounter (Signed)
-----   Message from Virginia Crews, MD sent at 11/24/2019  7:59 AM EDT ----- Normal labs

## 2019-11-26 ENCOUNTER — Telehealth: Payer: Self-pay | Admitting: Family Medicine

## 2019-11-26 NOTE — Telephone Encounter (Signed)
Since she is already on the doxycycline, she does not need to add amoxicillin for upcoming dental work.  Just continue the doxycycline.

## 2019-11-26 NOTE — Telephone Encounter (Signed)
Patient advised as below. Patient verbalizes understanding and is in agreement with treatment plan.  

## 2019-11-26 NOTE — Telephone Encounter (Signed)
Patient requesting to speak with CMA to discuss upcoming routine dental work. She states she typically takes amox but is currently on doxy. She states she is concerned and would like to speak with clinical staff regarding.

## 2019-11-30 ENCOUNTER — Telehealth: Payer: Self-pay

## 2019-11-30 ENCOUNTER — Encounter: Payer: Self-pay | Admitting: Family Medicine

## 2019-11-30 MED ORDER — DOXYCYCLINE HYCLATE 100 MG PO TABS: 100.0000 mg | ORAL_TABLET | Freq: Two times a day (BID) | ORAL | 0 refills | Status: DC

## 2019-11-30 NOTE — Telephone Encounter (Signed)
Note printed. Please fax

## 2019-11-30 NOTE — Telephone Encounter (Signed)
Tina [name] with [facility] needs something in writing stating the information Dr B advised on the abx for dental appts.. They need for her records, and her upcoming appt today at 11:30 am.  Fax: [phone]  cb [phone]  If you can send asap, because if they do not have this info by appt time, they will not be able to see her

## 2019-11-30 NOTE — Telephone Encounter (Signed)
Has 2 pills left of Doxy and did great. Stated we are supposed to send more Abx if tolerated well. [facility].

## 2019-11-30 NOTE — Telephone Encounter (Signed)
Note faxed.   Thanks,   -Mickel Baas

## 2019-12-19 ENCOUNTER — Other Ambulatory Visit: Payer: Self-pay | Admitting: Family Medicine

## 2019-12-19 DIAGNOSIS — M353 Polymyalgia rheumatica: Secondary | ICD-10-CM

## 2019-12-19 NOTE — Telephone Encounter (Signed)
Requested medication (s) are due for refill today: yes  Requested medication (s) are on the active medication list: yes  Last refill:  4/26/1#120  Future visit scheduled: yes  Notes to clinic:  Refill not delegated per protocol    Requested Prescriptions  Pending Prescriptions Disp Refills   predniSONE (DELTASONE) 1 MG tablet [Pharmacy Med Name: PREDNISONE 1 MG TABLET] 120 tablet 0    Sig: TAKE 4 TABLETS BY MOUTH DAILY      Not Delegated - Endocrinology:  Oral Corticosteroids Failed - 12/19/2019 12:45 AM      Failed - This refill cannot be delegated      Passed - Last BP in normal range    BP Readings from Last 1 Encounters:  11/23/19 130/65          Passed - Valid encounter within last 6 months    Recent Outpatient Visits           3 weeks ago Essential hypertension   [facility] [place], Dionne Bucy, MD   2 months ago Infection associated with internal left hip prosthesis, subsequent encounter   [facility] [employer], Dionne Bucy, MD   7 months ago Medicare annual wellness visit, subsequent   [facility] [place], Dionne Bucy, MD   9 months ago Abscess of vulva   [facility], Dionne Bucy, MD   1 year ago [place], [place], [place]       Future Appointments             In 4 months Bacigalupo, Dionne Bucy, MD [facility] [place]

## 2019-12-21 ENCOUNTER — Other Ambulatory Visit: Payer: Self-pay | Admitting: Family Medicine

## 2019-12-21 DIAGNOSIS — M353 Polymyalgia rheumatica: Secondary | ICD-10-CM

## 2019-12-21 NOTE — Telephone Encounter (Signed)
This request has already been pended for PCP review

## 2019-12-21 NOTE — Telephone Encounter (Signed)
Copied from [place] [id number]. Topic: Quick Communication - Rx Refill/Question >> Dec 21, 2019  4:17 PM Leward Quan A wrote: Medication: predniSONE (DELTASONE) 1 MG tablet ( per patient 3 tablets daily)  Has the patient contacted their pharmacy? Yes.   (Agent: If no, request that the patient contact the pharmacy for the refill.) (Agent: If yes, when and what did the pharmacy advise?)  Preferred Pharmacy (with phone number or street name): [id number] [place], [place] [address]  Phone:  [phone] Fax:  [phone]     Agent: Please be advised that RX refills may take up to 3 business days. We ask that you follow-up with your pharmacy.

## 2019-12-22 MED ORDER — PREDNISONE 1 MG PO TABS: 3.0000 mg | ORAL_TABLET | Freq: Every day | ORAL | 0 refills | Status: DC

## 2019-12-23 MED ORDER — PREDNISONE 1 MG PO TABS: 3.0000 mg | ORAL_TABLET | Freq: Every day | ORAL | 0 refills | Status: DC

## 2019-12-23 NOTE — Telephone Encounter (Signed)
Patient requesting predniSONE (DELTASONE) 1 MG tablet to go to   [id number] [place] [place] - [address] Phone:  [phone]  Fax:  [phone]     Patient would like a follow up call when completed today best # [phone] [facility])

## 2019-12-23 NOTE — Addendum Note (Signed)
Addended by: Addison Naegeli on: 12/23/2019 11:08 AM   Modules accepted: Orders

## 2019-12-23 NOTE — Telephone Encounter (Signed)
This encounter was created in error - please disregard.

## 2019-12-23 NOTE — Telephone Encounter (Signed)
Spoke with Judson Roch[facility]; she states they have not received this request; will route to office for final disposition because script was sent to [facility].  Requested medication (s) are due for refill today: yes  Requested medication (s) are on the active medication list: yes  Last refill: 12/22/19; sent to [facility]  Future visit scheduled: yes  Notes to clinic: pt request script be sent to [place], [place]

## 2019-12-30 ENCOUNTER — Other Ambulatory Visit: Payer: Self-pay | Admitting: Infectious Diseases

## 2019-12-30 MED ORDER — DOXYCYCLINE HYCLATE 100 MG PO TABS: 100.0000 mg | ORAL_TABLET | Freq: Two times a day (BID) | ORAL | 0 refills | Status: DC

## 2019-12-31 ENCOUNTER — Other Ambulatory Visit: Payer: Self-pay | Admitting: Family Medicine

## 2019-12-31 DIAGNOSIS — M353 Polymyalgia rheumatica: Secondary | ICD-10-CM

## 2019-12-31 NOTE — Telephone Encounter (Signed)
Copied from [place] [id number]. Topic: General - Other >> Dec 31, 2019  2:06 PM Celene Kras wrote: Reason for CRM: PT called stating that she received a call from office. Pt states that there was no vm left for her. Please advise .

## 2019-12-31 NOTE — Telephone Encounter (Signed)
This was filled 1 week ago for a 1 month supply

## 2020-01-13 ENCOUNTER — Telehealth: Payer: Self-pay

## 2020-01-13 NOTE — Telephone Encounter (Signed)
Copied from [place] [id number]. Topic: General - Other >> Jan 13, 2020  1:16 PM Leward Quan A wrote: Reason for CRM: Clara with [facility] called to request something in writing from Dr Brita Romp to say that patient does not need to hade any other medication since she is ondoxycycline (VIBRA-TABS) 100 MG tablet. States that Dr is requesting this so they can have it on file for future appointments and that there will be no confusion. Please fax letter to   Fax# [phone] Ph# [phone]

## 2020-01-16 ENCOUNTER — Other Ambulatory Visit: Payer: Self-pay | Admitting: Family Medicine

## 2020-01-16 DIAGNOSIS — M353 Polymyalgia rheumatica: Secondary | ICD-10-CM

## 2020-01-18 NOTE — Telephone Encounter (Signed)
Letter faxed to [facility].   Thanks,   -Mickel Baas

## 2020-01-18 NOTE — Telephone Encounter (Signed)
There is a letter from 11/30/19 that states this that was faxed to [facility]. We can resend if needed

## 2020-01-19 ENCOUNTER — Telehealth: Payer: Self-pay | Admitting: Family Medicine

## 2020-01-19 NOTE — Telephone Encounter (Signed)
Pt called and is requesting to have a nurse give her a call back regarding her prednisone medication. Pt states that she is not sure that she should be continuing to take 3 a day. Please advise.

## 2020-01-20 NOTE — Telephone Encounter (Signed)
We plan to decrease the dose of her prednisone every 2 to 4 weeks.  She should go ahead down to 2 mg daily of the prednisone.  We can send in a 1 month supply of this and then she can call the office back again and we can decrease it further from there.

## 2020-01-20 NOTE — Telephone Encounter (Signed)
Patient called stating that [facility] She is unsure why she would get them from two different pharmacies.  She states that she has 5 more days to complete the 3 mg (3x1mg ) tabs cycle.  She states then she will take 2 mg (2x1mg ) tabs for 1 month. Then as she understands she will go to 1mg  for a month.  She was calling because of she has had so many pharmacies call with prednisone for her.  She states that she will call if she needs more to complete her plan. Please advise patient if her understanding is not correct.

## 2020-01-20 NOTE — Telephone Encounter (Signed)
LMTCB okay for [facility] triage to advise patient of provider message below. KW

## 2020-01-22 NOTE — Telephone Encounter (Signed)
Taper is correct. No idea why both pharmacies are sending it.

## 2020-01-27 ENCOUNTER — Other Ambulatory Visit: Payer: Self-pay | Admitting: Infectious Diseases

## 2020-01-27 MED ORDER — DOXYCYCLINE HYCLATE 100 MG PO TABS: 100.0000 mg | ORAL_TABLET | Freq: Two times a day (BID) | ORAL | 3 refills | Status: AC

## 2020-02-03 ENCOUNTER — Telehealth: Payer: Self-pay

## 2020-02-03 NOTE — Telephone Encounter (Signed)
Copied from [place] [id number]. Topic: General - Other >> Feb 03, 2020 11:52 AM Sheran Luz wrote: Reason for CRM: Patient requesting documentation for colostomy supplies. Patient was unsure of what exactly she needed and states that she called last week to request as well. Unable to find documentation to support.

## 2020-02-03 NOTE — Telephone Encounter (Signed)
Please review for Dr. B ° ° °Thanks,  ° °-Lauryn Lizardi  °

## 2020-02-03 NOTE — Telephone Encounter (Signed)
I see absolutely no documentation of this since 11/2017 on our end nor do I See documentation from specialty provider. If she needs a renewal of supplies she will likely need a visit within the past year to document this. I am unsure what documentation she needs or if its orders. Either way, she likely needs an OV.

## 2020-02-04 NOTE — Telephone Encounter (Signed)
Spoke to pt to advise that an appt would be needed as we do not know the type of supplies that are needed. We do not have any documentation of this. She declined OV. She wanted me to call [employer] to see what it is that was needed. I called [employer] at [phone], and spoke with [facility]. She reports that no documentation is needed from our office, and that her supplies has already been shipped to the pt as of yesterday.   Advised pt of this and to let her know if anything else concerning the order, an OV would be needed to discuss. Pt verbalized understanding.

## 2020-03-04 ENCOUNTER — Telehealth: Payer: Self-pay

## 2020-03-04 NOTE — Telephone Encounter (Signed)
I have not received anything for her. Does she need her colostomy supplies? Is [employer] who she gets her supplies from?

## 2020-03-04 NOTE — Telephone Encounter (Signed)
Do you what she is referring to?

## 2020-03-04 NOTE — Telephone Encounter (Signed)
Copied from [place] [phone]. Topic: General - Inquiry >> Mar 03, 2020  4:36 PM Greggory Keen D wrote: Pt called saying she received an email from [employer] stating they need documentation from her doctor for the supplies she ordered.  The patient states she does not know what items they are referring to unless it is her colostomy supplies.  She would like a nurse call her back  CB#  [phone]

## 2020-03-07 NOTE — Telephone Encounter (Signed)
Spoke with [employer] who has shipped out medical supplies as of this afternoon. Patient should be receiving package within the next 24-72hrs. KW

## 2020-03-07 NOTE — Telephone Encounter (Signed)
We may have to call [employer] to see what they need as I have not received anything from them on what information they are requiring.

## 2020-03-07 NOTE — Telephone Encounter (Signed)
Patient states that this is for colostomy supplies and she uses [employer] as the company. KW

## 2020-03-09 ENCOUNTER — Telehealth: Payer: Self-pay

## 2020-03-09 NOTE — Telephone Encounter (Signed)
Copied from [place] [phone]. Topic: General - Other >> Mar 09, 2020  3:19 PM Hinda Lenis D wrote: PT receive her order from [place], they need a request to be send / please advise

## 2020-03-10 NOTE — Telephone Encounter (Signed)
Please review. Thanks!  

## 2020-03-10 NOTE — Telephone Encounter (Signed)
[employer] will fax an order to be signed off for SKIN PREP.

## 2020-03-10 NOTE — Telephone Encounter (Signed)
Usually they fax [place] the order to sign off on .  Can we call [employer]?

## 2020-03-11 NOTE — Telephone Encounter (Signed)
Tina [name] with [employer] calling to state that she has refaxed paperwork. Confirmed fax#.

## 2020-03-18 NOTE — Telephone Encounter (Signed)
Patient is calling to request to No Sting Skin Prep sent to [employer] for her. Patient has not received the supplies. Please advise.  Patient requesting this to be placed today Patient has requesting this for several days.  Patient is requesting documentation to be faxed. [phone] or can be emailed to [email or web address] Patient is requesting a call back.  [phone]

## 2020-03-21 NOTE — Telephone Encounter (Signed)
Have you guys seen a fax for this patient?

## 2020-03-21 NOTE — Telephone Encounter (Signed)
Please advise 

## 2020-03-21 NOTE — Telephone Encounter (Addendum)
Tina [name] with [employer] is calling in to follow up on order being signed. Tina [name] says that it has been re-faxed. Pt is seeking further assistance.   Phone: [phone]

## 2020-03-22 NOTE — Telephone Encounter (Signed)
Patient is calling to check status on request to [facility]. Patient states it is ok to leave message. Please call patient back with updated starus.

## 2020-03-22 NOTE — Telephone Encounter (Signed)
Did the fax come through?

## 2020-03-22 NOTE — Telephone Encounter (Signed)
We have not received the fax at this time.

## 2020-03-22 NOTE — Telephone Encounter (Signed)
I have not seen this fax.  Please make sure they have the correct fax number as we haven't received it

## 2020-03-22 NOTE — Telephone Encounter (Signed)
Fax in Dr. Sharmaine Base basket.

## 2020-03-23 NOTE — Telephone Encounter (Signed)
Order signed and will be faxed by front office staff this morning.

## 2020-04-05 ENCOUNTER — Ambulatory Visit (INDEPENDENT_AMBULATORY_CARE_PROVIDER_SITE_OTHER): Payer: Medicare Other | Admitting: Physician Assistant

## 2020-04-05 DIAGNOSIS — T8452XD Infection and inflammatory reaction due to internal left hip prosthesis, subsequent encounter: Secondary | ICD-10-CM

## 2020-04-05 DIAGNOSIS — M353 Polymyalgia rheumatica: Secondary | ICD-10-CM

## 2020-04-05 NOTE — Progress Notes (Signed)
Virtual telephone visit    Virtual Visit via Telephone Note   This visit type was conducted due to national recommendations for restrictions regarding the COVID-19 Pandemic (e.g. social distancing) in an effort to limit this patient's exposure and mitigate transmission in our community. Due to her co-morbid illnesses, this patient is at least at moderate risk for complications without adequate follow up. This format is felt to be most appropriate for this patient at this time. The patient did not have access to video technology or had technical difficulties with video requiring transitioning to audio format only (telephone). Physical exam was limited to content and character of the telephone converstion.    Patient location: Home Provider location: Office    I discussed the limitations of evaluation and management by telemedicine and the availability of in person appointments. The patient expressed understanding and agreed to proceed.  Visit Date: 04/05/2020  Today's healthcare provider: Trinna Post, PA-C   Chief Complaint  Patient presents with  . Shoulder Pain   Subjective    HPI   Patient with a history of polymyalgia rheumatica and right prosthetic hip infection presents today with worsening of arthritis symptoms. She was treated for infected right hip prostethsis with staph epi in 10/2019. Decision was made to avoid surgery and proceed with conservative management so she is treated with extended course of doxycycline. She was also tapered off of prednisone which she had been on chronically for polymyalgia rheumatica. She reports she stopped prednisone two weeks ago however her joint pain resurfaced and she has been taking it again for five days. She is worried this will suppress her immune system and she is wondering if she should be doing that.       Medications: Outpatient Medications Prior to Visit  Medication Sig  . diphenhydramine-acetaminophen (TYLENOL PM)  25-500 MG TABS tablet Take 1 tablet by mouth at bedtime as needed.  Marland Kitchen lisinopril (ZESTRIL) 20 MG tablet Take 1 tablet (20 mg total) by mouth daily.  . metoprolol tartrate (LOPRESSOR) 50 MG tablet Take 1 tablet (50 mg total) by mouth 2 (two) times daily.  . Multiple Vitamin (MULTIVITAMIN) tablet Take 1 tablet by mouth daily.  . predniSONE (DELTASONE) 1 MG tablet TAKE 3 TABLETS (3 MG TOTAL) BY MOUTH DAILY WITH BREAKFAST.  Marland Kitchen triamcinolone ointment (KENALOG) 0.5 % Apply 1 application topically 2 (two) times daily.   No facility-administered medications prior to visit.    Review of Systems    Objective    There were no vitals taken for this visit.     Assessment & Plan    1. Polymyalgia rheumatica ([place])  Advised she should not resume prednisone. May use tylenol or lidocaine patches for joint pain.   2. Infection associated with internal left hip prosthesis, subsequent encounter     No follow-ups on file.    I discussed the assessment and treatment plan with the patient. The patient was provided an opportunity to ask questions and all were answered. The patient agreed with the plan and demonstrated an understanding of the instructions.   The patient was advised to call back or seek an in-person evaluation if the symptoms worsen or if the condition fails to improve as anticipated.  I provided 21 minutes of non-face-to-face time during this encounter.  ITrinna Post, PA-C, have reviewed all documentation for this visit. The documentation on 04/13/20 for the exam, diagnosis, procedures, and orders are all accurate and complete.  The entirety of the information  documented in the History of Present Illness, Review of Systems and Physical Exam were personally obtained by me. Portions of this information were initially documented by [facility] and reviewed by me for thoroughness and accuracy.    Paulene Floor [facility] [phone]  (phone) [phone] (fax)  [place]

## 2020-04-06 ENCOUNTER — Telehealth: Payer: Self-pay

## 2020-04-06 NOTE — Telephone Encounter (Signed)
Please contact patient. She was recently seen by Adriana on 04/05/2020. Im not sure if this message is related to any orders placed during that visit.

## 2020-04-06 NOTE — Telephone Encounter (Signed)
Copied from [place] [phone]. Topic: General - Other >> Apr 06, 2020  2:00 PM Keene Breath wrote: Reason for CRM: Patient would like the nurse or doctor to call her regarding an email she received from [employer] regarding an order that was placed.  She was upset and does not know exactly what they want.  Please call patient to discuss at [phone]

## 2020-04-06 NOTE — Telephone Encounter (Addendum)
[employer] is requesting documentation for the need of her ostomy supplies.  Pt has an office visit scheduled for 04/25/2020.  I spoke with Ms. Jasmer she does not have enough supplies to last until 09/27.  We rescheduled her appointment to 04/08/2020 at 3pm.    Thanks,   -Mickel Baas

## 2020-04-07 ENCOUNTER — Telehealth: Payer: Self-pay

## 2020-04-07 NOTE — Telephone Encounter (Signed)
Copied from [place] [phone]. Topic: General - Inquiry >> Apr 07, 2020 12:59 PM Gillis Ends D wrote: Reason for CRM: Can you please return a call to the patient as to the day she is to come for her awv. She has 2 dates and one has a reschedule beside it. I was unclear so I told the patient that she would receive a call back with clarity. Her call back number is [phone]. She says that she is slow to answer the phone so leave a message. Please advise the patient

## 2020-04-08 ENCOUNTER — Ambulatory Visit: Payer: Medicare Other | Admitting: Family Medicine

## 2020-04-08 NOTE — Telephone Encounter (Signed)
Na voice mail is off.

## 2020-04-12 NOTE — Telephone Encounter (Signed)
Patient advised on appt for 04/26/2020.

## 2020-04-19 ENCOUNTER — Telehealth: Payer: Self-pay

## 2020-04-19 NOTE — Telephone Encounter (Signed)
Copied from [place] [phone]. Topic: Quick Communication - See Telephone Encounter >> Apr 19, 2020 10:21 AM Loma Boston wrote: CRM for notification. See Telephone encounter for: 04/19/20.Pt is stating recently came off of prednisone and is in terrible pain now with shoulder and must have something additional. Wants a cb from Dr Sharmaine [employer] nurse. At [phone] or [phone]

## 2020-04-20 ENCOUNTER — Telehealth: Payer: Self-pay

## 2020-04-20 ENCOUNTER — Ambulatory Visit: Payer: Medicare Other | Admitting: *Deleted

## 2020-04-20 NOTE — Telephone Encounter (Signed)
Recently seen by [facility] regarding this pain. Will forward as she has more recent knowledge about it

## 2020-04-20 NOTE — Telephone Encounter (Signed)
Pt would like something else for pain.  She states the tylenol is not working.

## 2020-04-20 NOTE — Telephone Encounter (Signed)
Pt called back to follow up on request for personal care services from [place] phone [phone]  Fax [phone]  Pt states she needs 2-3 hrs a week. Personal care needs, like preparing food, going to drug store etc.  She states they assured her the dr would know what to do.

## 2020-04-20 NOTE — Telephone Encounter (Signed)
Her pain is rebound of PMR from stopping the steroids due to chronically infected prosthetic hip. She cannot restart prednisone and she can't take anti-inflammatories due to CKD. I think her two options are to follow up with PCP for chronic management or we may refer her to rheumatology.

## 2020-04-20 NOTE — Chronic Care Management (AMB) (Signed)
°  Chronic Care Management   Social Work Note  04/20/2020 Name: RHONDALYN CLINGAN MRN: [id number] DOB: August 07, 1932  Eustaquio Maize is a 86 y.o. year old female who sees Bacigalupo, Dionne Bucy, MD for primary care. The CCM team was consulted for assistance with [employer]  related to in home care.   Phone call to patient to follow up on needs for in home care. Per patient, she was not sure what was needed and recommended that this social worker contact [facility] to follow up. Patient is aware that her Medicare alone does not cover personal care services and that she will have to pay privately.  Phone call to [facility]-spoke with Hassan Rowan who verbalized an understanding that patient is not covered under Medicaid and will be a private pay. No further paperwork is needed at this time. Per Hassan Rowan, patient will start in home care services next Wednesday.  SDOH (Social Determinants of Health) assessments performed: No     Outpatient Encounter Medications as of 04/20/2020  Medication Sig   diphenhydramine-acetaminophen (TYLENOL PM) 25-500 MG TABS tablet Take 1 tablet by mouth at bedtime as needed.   doxycycline (VIBRAMYCIN) 100 MG capsule Take 100 mg by mouth 2 (two) times daily.   lisinopril (ZESTRIL) 20 MG tablet Take 1 tablet (20 mg total) by mouth daily.   metoprolol tartrate (LOPRESSOR) 50 MG tablet Take 1 tablet (50 mg total) by mouth 2 (two) times daily.   Multiple Vitamin (MULTIVITAMIN) tablet Take 1 tablet by mouth daily.   predniSONE (DELTASONE) 1 MG tablet TAKE 3 TABLETS (3 MG TOTAL) BY MOUTH DAILY WITH BREAKFAST.   triamcinolone ointment (KENALOG) 0.5 % Apply 1 application topically 2 (two) times daily.   No facility-administered encounter medications on file as of 04/20/2020.    Goals Addressed   None     Follow Up Plan: Client will call this social worker if further follow up is needed   Elliot Gurney, [place] Worker    [facility] [phone]

## 2020-04-20 NOTE — Telephone Encounter (Signed)
Advised pt as below. Pt does not want to see a rheumatologist. She wants something for pain. I advised pt that she will need to come in and discuss with Dr. B to talk about treatment for this chronic issue. She has an appt with Dr. Jacinto Reap on 04/26/20, and reports that she may not be able to come in due to the pain she is having. I offered to do the appt as a virtual visit, pt declined. She was very unhappy and feels that we are not taking her seriously. She states that she has dealt with this for 32yrs, and we have all of her records and we should know how to treat based on the information in her chart. I reassured the pt that in order to provide the best care, Dr. B (or another provider) needs to reassess this and make sure that the pt is in agreement. And its also a good time to express questions or concerns about her symptoms. Pt says that she will come in for her appt and just suffer for the rest of the week. I offered another appt with another provider, pt declined. I offered to deliver another message back to PCP and provider, pt declined and says "i'll deal with it on my visit".    Just FYI! Thanks!

## 2020-04-20 NOTE — Telephone Encounter (Signed)
Copied from [place] [phone]. Topic: General - Other >> Apr 19, 2020  5:09 PM Rainey Pines A wrote: Patient had a rep from [facility] visit her home today and Patient needs a prescription for personal care services and would like a callback at [phone]

## 2020-04-20 NOTE — Telephone Encounter (Signed)
Can you help me get started with PCS paperwork for this patient?

## 2020-04-21 NOTE — Telephone Encounter (Signed)
See CCM notes. Patient will be private pay for services.

## 2020-04-25 ENCOUNTER — Ambulatory Visit: Payer: Self-pay | Admitting: Family Medicine

## 2020-04-25 ENCOUNTER — Telehealth: Payer: Self-pay

## 2020-04-25 NOTE — Telephone Encounter (Signed)
No problem, but I think she should consider a phone AWV with [facility].  Would like to see her in 2-3 months at least for follow-up

## 2020-04-25 NOTE — Telephone Encounter (Signed)
Copied from [place] [phone]. Topic: Appointment Scheduling - Scheduling Inquiry for Clinic >> Apr 25, 2020  9:03 AM Oneta Rack wrote: Patient cancelled her 04/26/2020 AWV and OV appointment. Patient states she does not feel comfortable coming down her stairs. Patient denied a phone call appointment and would like a follow up reminder  to schedule a phone visit at a later date. Patient states she does not want to have any issues refilling her medication.

## 2020-04-26 ENCOUNTER — Encounter: Payer: Medicare Other | Admitting: Family Medicine

## 2020-05-19 ENCOUNTER — Encounter: Payer: Self-pay | Admitting: *Deleted

## 2020-05-19 NOTE — Progress Notes (Signed)
This encounter was created in error - please disregard.

## 2020-05-30 ENCOUNTER — Other Ambulatory Visit: Payer: Self-pay

## 2020-05-30 MED ORDER — DOXYCYCLINE HYCLATE 100 MG PO CAPS: 100.0000 mg | ORAL_CAPSULE | Freq: Two times a day (BID) | ORAL | 1 refills | Status: AC

## 2020-06-21 ENCOUNTER — Emergency Department
Admission: EM | Admit: 2020-06-21 | Discharge: 2020-06-22 | Disposition: A | Discharge status: Home / Self Care | Payer: Medicare Other | Source: Home / Self Care | Attending: Emergency Medicine | Admitting: Emergency Medicine

## 2020-06-21 ENCOUNTER — Other Ambulatory Visit: Payer: Self-pay

## 2020-06-21 ENCOUNTER — Ambulatory Visit: Payer: Medicare Other | Admitting: Physician Assistant

## 2020-06-21 ENCOUNTER — Emergency Department: Payer: Medicare Other

## 2020-06-21 DIAGNOSIS — R52 Pain, unspecified: Secondary | ICD-10-CM | POA: Diagnosis not present

## 2020-06-21 DIAGNOSIS — R9431 Abnormal electrocardiogram [ECG] [EKG]: Secondary | ICD-10-CM | POA: Diagnosis not present

## 2020-06-21 DIAGNOSIS — Z90710 Acquired absence of both cervix and uterus: Secondary | ICD-10-CM | POA: Diagnosis not present

## 2020-06-21 DIAGNOSIS — Z66 Do not resuscitate: Secondary | ICD-10-CM | POA: Diagnosis not present

## 2020-06-21 DIAGNOSIS — I129 Hypertensive chronic kidney disease with stage 1 through stage 4 chronic kidney disease, or unspecified chronic kidney disease: Secondary | ICD-10-CM | POA: Insufficient documentation

## 2020-06-21 DIAGNOSIS — W19XXXA Unspecified fall, initial encounter: Secondary | ICD-10-CM | POA: Insufficient documentation

## 2020-06-21 DIAGNOSIS — R531 Weakness: Secondary | ICD-10-CM

## 2020-06-21 DIAGNOSIS — R6889 Other general symptoms and signs: Secondary | ICD-10-CM | POA: Diagnosis not present

## 2020-06-21 DIAGNOSIS — Z20822 Contact with and (suspected) exposure to covid-19: Secondary | ICD-10-CM | POA: Diagnosis not present

## 2020-06-21 DIAGNOSIS — Z87891 Personal history of nicotine dependence: Secondary | ICD-10-CM | POA: Insufficient documentation

## 2020-06-21 DIAGNOSIS — Z743 Need for continuous supervision: Secondary | ICD-10-CM | POA: Diagnosis not present

## 2020-06-21 DIAGNOSIS — Z79899 Other long term (current) drug therapy: Secondary | ICD-10-CM | POA: Insufficient documentation

## 2020-06-21 DIAGNOSIS — N189 Chronic kidney disease, unspecified: Secondary | ICD-10-CM | POA: Insufficient documentation

## 2020-06-21 DIAGNOSIS — R079 Chest pain, unspecified: Secondary | ICD-10-CM | POA: Diagnosis not present

## 2020-06-21 DIAGNOSIS — R0902 Hypoxemia: Secondary | ICD-10-CM | POA: Diagnosis not present

## 2020-06-21 DIAGNOSIS — E860 Dehydration: Secondary | ICD-10-CM | POA: Diagnosis not present

## 2020-06-21 DIAGNOSIS — N179 Acute kidney failure, unspecified: Secondary | ICD-10-CM | POA: Diagnosis not present

## 2020-06-21 DIAGNOSIS — Z043 Encounter for examination and observation following other accident: Secondary | ICD-10-CM | POA: Diagnosis not present

## 2020-06-21 DIAGNOSIS — Z96652 Presence of left artificial knee joint: Secondary | ICD-10-CM | POA: Insufficient documentation

## 2020-06-21 LAB — BASIC METABOLIC PANEL
Anion gap: 10 (ref 5–15)
BUN: 37 mg/dL — ABNORMAL HIGH (ref 8–23)
CO2: 21 mmol/L — ABNORMAL LOW (ref 22–32)
Calcium: 8.9 mg/dL (ref 8.9–10.3)
Chloride: 107 mmol/L (ref 98–111)
Creatinine, Ser: 1.55 mg/dL — ABNORMAL HIGH (ref 0.44–1.00)
GFR, Estimated: 33 mL/min — ABNORMAL LOW (ref >60)
Glucose, Bld: 132 mg/dL — ABNORMAL HIGH (ref 70–99)
Potassium: 4.2 mmol/L (ref 3.5–5.1)
Sodium: 138 mmol/L (ref 135–145)

## 2020-06-21 LAB — CBC
HCT: 35.4 % — ABNORMAL LOW (ref 36.0–46.0)
Hemoglobin: 11.5 g/dL — ABNORMAL LOW (ref 12.0–15.0)
MCH: 31.4 pg (ref 26.0–34.0)
MCHC: 32.5 g/dL (ref 30.0–36.0)
MCV: 96.7 fL (ref 80.0–100.0)
Platelets: 290 10*3/uL (ref 150–400)
RBC: 3.66 MIL/uL — ABNORMAL LOW (ref 3.87–5.11)
RDW: 16.6 % — ABNORMAL HIGH (ref 11.5–15.5)
WBC: 7.9 10*3/uL (ref 4.0–10.5)
nRBC: 0.9 % — ABNORMAL HIGH (ref 0.0–0.2)

## 2020-06-21 MED ORDER — SODIUM CHLORIDE 0.9 % IV BOLUS
500.0000 mL | Freq: Once | INTRAVENOUS | Status: AC
  Administered 2020-06-21: 500 mL via INTRAVENOUS

## 2020-06-21 NOTE — ED Notes (Signed)
Pt refused blood pressure monitor and urinalysis test, ed md aware

## 2020-06-21 NOTE — ED Triage Notes (Signed)
First Nurse Note:  While transferring into car after PCP appointment, felt weak and patient's son prevented a fall.  Patietn c/o knee pain.  VS wnl

## 2020-06-21 NOTE — ED Triage Notes (Signed)
Pt arrives to ER for a fall. States she was outside trying to get into car. Landed on bottom. A&O x4. Hypotensive in triage.   Camelia Eng [phone]

## 2020-06-21 NOTE — ED Provider Notes (Addendum)
[facility] Emergency Department Provider Note   ____________________________________________   First MD Initiated Contact with Patient 06/21/20 2118     (approximate)  I have reviewed the triage vital signs and the nursing notes.   HISTORY  Chief Complaint Fall    HPI Tina [name] is a 86 y.o. female with past medical history of hypertension, chronic kidney disease, and polymyalgia rheumatica who presents to the ED complaining of generalized weakness and fall.  Patient states that she has been feeling gradually weaker for about the past month, was scheduled to go see her PCP for this problem earlier today.  While her son was helping her to get into the car, her legs seem to give out on her and she started falling towards the ground.  Her son was able to lower her to the ground and she landed on her bottom, but did not hit her head or lose consciousness.  She denies any associated chest pain or shortness of breath.  On my evaluation, patient is requesting to be discharged home.  She denies any fevers, cough, nausea, vomiting, diarrhea, dysuria, or hematuria.        Past Medical History:  Diagnosis Date  . Hypertension     Patient Active Problem List   Diagnosis Date Noted  . Prosthetic joint infection of left hip ([place]) 11/23/2019  . Incontinence in female 11/23/2019  . Senile purpura ([place]) 04/24/2019  . CKD (chronic kidney disease) 06/08/2015  . Clinical depression 06/08/2015  . BP (high blood pressure) 06/08/2015  . Cannot sleep 06/08/2015  . Venous lake 06/08/2015  . Adynamia 06/08/2015  . Polymyalgia rheumatica ([place]) 05/27/2013  . Difficulty hearing 07/08/2009  . Accumulation of fluid in tissues 02/14/2009  . Menopausal and perimenopausal disorder 02/14/2009  . Excessive urination at night 02/14/2009  . 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 Colon, diverticulosis 08/19/2003  . Diaphragmatic hernia 08/19/2003  . Female  genuine stress incontinence 08/19/2002  . Arthritis, degenerative 01/20/2001  . Adiposity 12/21/1994    Past Surgical History:  Procedure Laterality Date  . ABDOMINAL HYSTERECTOMY  1980's  . COLON SURGERY  06/08/13   s/p ruptured diverticulitis, has fistula; had TPN for 3 mths  . DILATION AND CURETTAGE OF UTERUS  1970's  . HIP SURGERY Right 06/01/2013  . KNEE SURGERY Left 2004   total knee replacement     Prior to Admission medications   Medication Sig Start Date End Date Taking? Authorizing Provider  diphenhydramine-acetaminophen (TYLENOL PM) 25-500 MG TABS tablet Take 1 tablet by mouth at bedtime as needed.    [provider]  doxycycline (VIBRAMYCIN) 100 MG capsule Take 1 capsule (100 mg total) by mouth 2 (two) times daily. 05/30/20   Tsosie Billing, MD  lisinopril (ZESTRIL) 20 MG tablet Take 1 tablet (20 mg total) by mouth daily. 04/24/19   Bacigalupo, Dionne Bucy, MD  metoprolol tartrate (LOPRESSOR) 50 MG tablet Take 1 tablet (50 mg total) by mouth 2 (two) times daily. 04/24/19   Virginia Crews, MD  Multiple Vitamin (MULTIVITAMIN) tablet Take 1 tablet by mouth daily.    [provider]  predniSONE (DELTASONE) 1 MG tablet TAKE 3 TABLETS (3 MG TOTAL) BY MOUTH DAILY WITH BREAKFAST. 01/16/20   Virginia Crews, MD  triamcinolone ointment (KENALOG) 0.5 % Apply 1 application topically 2 (two) times daily. 11/23/19   Virginia Crews, MD    Allergies Erythromycin  Family History  Problem Relation Age of Onset  . Cancer Mother  breast    Social History Social History   Tobacco Use  . Smoking status: Former Smoker    Quit date: 07/30/1979    Years since quitting: 40.9  . Smokeless tobacco: Never Used  Vaping Use  . Vaping Use: Never used  Substance Use Topics  . Alcohol use: No    Comment: 0-1 scotch drink(s) a month  . Drug use: No    Review of Systems  Constitutional: No fever/chills.  Positive for generalized weakness. Eyes:  No visual changes. ENT: No sore throat. Cardiovascular: Denies chest pain. Respiratory: Denies shortness of breath. Gastrointestinal: No abdominal pain.  No nausea, no vomiting.  No diarrhea.  No constipation. Genitourinary: Negative for dysuria. Musculoskeletal: Negative for back pain. Skin: Negative for rash. Neurological: Negative for headaches, focal weakness or numbness.  ____________________________________________   PHYSICAL EXAM:  VITAL SIGNS: ED Triage Vitals  Enc Vitals Group     BP 06/21/20 1706 (!) 85/47     Pulse Rate 06/21/20 1706 71     Resp 06/21/20 1706 16     Temp 06/21/20 1706 (!) 97.5 F (36.4 C)     Temp Source 06/21/20 1706 Oral     SpO2 06/21/20 1706 99 %     Weight 06/21/20 1707 180 lb (81.6 kg)     Height 06/21/20 1707 5\' 4"  (1.626 m)     Head Circumference --      Peak Flow --      Pain Score 06/21/20 1707 0     Pain Loc --      Pain Edu? --      Excl. in [place]? --     Constitutional: Alert and oriented. Eyes: Conjunctivae are normal. Head: Atraumatic. Nose: No congestion/rhinnorhea. Mouth/Throat: Mucous membranes are moist. Neck: Normal ROM, no midline cervical spine tenderness. Cardiovascular: Normal rate, regular rhythm. Grossly normal heart sounds. Respiratory: Normal respiratory effort.  No retractions. Lungs CTAB. Gastrointestinal: Soft and nontender. No distention. Genitourinary: deferred Musculoskeletal: No lower extremity tenderness nor edema. Neurologic:  Normal speech and language. No gross focal neurologic deficits are appreciated. Skin:  Skin is warm, dry and intact. No rash noted. Psychiatric: Mood and affect are normal. Speech and behavior are normal.  ____________________________________________   LABS (all labs ordered are listed, but only abnormal results are displayed)  Labs Reviewed  BASIC METABOLIC PANEL - Abnormal; Notable for the following components:      Result Value   CO2 21 (*)    Glucose, Bld 132 (*)    BUN  37 (*)    Creatinine, Ser 1.55 (*)    GFR, Estimated 33 (*)    All other components within normal limits  CBC - Abnormal; Notable for the following components:   RBC 3.66 (*)    Hemoglobin 11.5 (*)    HCT 35.4 (*)    RDW 16.6 (*)    nRBC 0.9 (*)    All other components within normal limits  URINALYSIS, COMPLETE (UACMP) WITH MICROSCOPIC  CBG MONITORING, ED  TROPONIN I (HIGH SENSITIVITY)   ____________________________________________  EKG  ED ECG REPORT I, Blake Divine, the attending physician, personally viewed and interpreted this ECG.   Date: 06/21/2020  EKG Time: 17:10  Rate: 69  Rhythm: normal sinus rhythm  Axis: Normal  Intervals:none  ST&T Change: Nonspecific T wave changes   PROCEDURES  Procedure(s) performed (including Critical Care):  Procedures   ____________________________________________   INITIAL IMPRESSION / ASSESSMENT AND PLAN / ED COURSE  86 year old female with possible history of hypertension, CKD, and polymyalgia rheumatica who presents to the ED complaining of generalized weakness for the past month with fall earlier today while trying to get into the car.  She did not hit her head or lose consciousness, has no cervical spine tenderness on exam and I do not feel imaging of her head or neck is indicated.  She has no injuries related to the fall, denies any pain.  As for her weakness, she was noted to be hypotensive on arrival, but denies any lightheadedness or syncope.  She was hydrated with IV fluids and hypotension has subsequently resolved.  She has a mild AKI but labs otherwise unremarkable.  Chest x-ray reviewed by me and shows no infiltrate, edema, or effusion.  EKG shows no evidence of arrhythmia or ischemia.  We will check troponin and UA, but if these are unremarkable patient will be appropriate for discharge home with PCP follow-up.  Patient requesting to be discharged home prior to result of troponin.  She has also been unable to  provide a urine sample.  Given she has had intermittent chest pain for 1 month with unremarkable EKG, I do have a low suspicion for ACS.  Risks of leaving prior to troponin result being known were explained to patient and she still wishes to go home.  She was counseled to follow-up with her PCP for recheck of her kidney function and to return to the ED for new worsening symptoms, patient agrees with plan.      ____________________________________________   FINAL CLINICAL IMPRESSION(S) / ED DIAGNOSES  Final diagnoses:  Generalized weakness  Fall, initial encounter     ED Discharge Orders    None       Note:  This document was prepared using Dragon voice recognition software and may include unintentional dictation errors.   Blake Divine, MD 06/21/20 2357    Blake Divine, MD 06/22/20 [phone]

## 2020-06-21 NOTE — ED Notes (Signed)
This RN assisted pt to toilet to empty out colostomy bag.

## 2020-06-22 ENCOUNTER — Emergency Department: Payer: Medicare Other

## 2020-06-22 ENCOUNTER — Inpatient Hospital Stay
Admission: EM | Admit: 2020-06-22 | Discharge: 2020-06-27 | DRG: 641 | Disposition: A | Discharge status: Skilled Nursing Facility | Payer: Medicare Other | Attending: Internal Medicine | Admitting: Internal Medicine

## 2020-06-22 DIAGNOSIS — R54 Age-related physical debility: Secondary | ICD-10-CM | POA: Diagnosis present

## 2020-06-22 DIAGNOSIS — Z743 Need for continuous supervision: Secondary | ICD-10-CM | POA: Diagnosis not present

## 2020-06-22 DIAGNOSIS — R339 Retention of urine, unspecified: Secondary | ICD-10-CM | POA: Diagnosis present

## 2020-06-22 DIAGNOSIS — R627 Adult failure to thrive: Secondary | ICD-10-CM | POA: Diagnosis present

## 2020-06-22 DIAGNOSIS — Z933 Colostomy status: Secondary | ICD-10-CM

## 2020-06-22 DIAGNOSIS — Z7401 Bed confinement status: Secondary | ICD-10-CM | POA: Diagnosis not present

## 2020-06-22 DIAGNOSIS — R069 Unspecified abnormalities of breathing: Secondary | ICD-10-CM | POA: Diagnosis not present

## 2020-06-22 DIAGNOSIS — N179 Acute kidney failure, unspecified: Secondary | ICD-10-CM | POA: Diagnosis not present

## 2020-06-22 DIAGNOSIS — M25521 Pain in right elbow: Secondary | ICD-10-CM | POA: Diagnosis present

## 2020-06-22 DIAGNOSIS — K5730 Diverticulosis of large intestine without perforation or abscess without bleeding: Secondary | ICD-10-CM | POA: Diagnosis not present

## 2020-06-22 DIAGNOSIS — N189 Chronic kidney disease, unspecified: Secondary | ICD-10-CM | POA: Diagnosis not present

## 2020-06-22 DIAGNOSIS — R6251 Failure to thrive (child): Secondary | ICD-10-CM | POA: Diagnosis present

## 2020-06-22 DIAGNOSIS — Y92009 Unspecified place in unspecified non-institutional (private) residence as the place of occurrence of the external cause: Secondary | ICD-10-CM | POA: Diagnosis not present

## 2020-06-22 DIAGNOSIS — I10 Essential (primary) hypertension: Secondary | ICD-10-CM | POA: Diagnosis not present

## 2020-06-22 DIAGNOSIS — N1831 Chronic kidney disease, stage 3a: Secondary | ICD-10-CM | POA: Diagnosis present

## 2020-06-22 DIAGNOSIS — M169 Osteoarthritis of hip, unspecified: Secondary | ICD-10-CM | POA: Diagnosis not present

## 2020-06-22 DIAGNOSIS — D631 Anemia in chronic kidney disease: Secondary | ICD-10-CM | POA: Diagnosis present

## 2020-06-22 DIAGNOSIS — G8929 Other chronic pain: Secondary | ICD-10-CM | POA: Diagnosis present

## 2020-06-22 DIAGNOSIS — Z96652 Presence of left artificial knee joint: Secondary | ICD-10-CM | POA: Diagnosis present

## 2020-06-22 DIAGNOSIS — E860 Dehydration: Principal | ICD-10-CM | POA: Diagnosis present

## 2020-06-22 DIAGNOSIS — H9190 Unspecified hearing loss, unspecified ear: Secondary | ICD-10-CM | POA: Diagnosis present

## 2020-06-22 DIAGNOSIS — R531 Weakness: Secondary | ICD-10-CM | POA: Diagnosis not present

## 2020-06-22 DIAGNOSIS — R338 Other retention of urine: Secondary | ICD-10-CM | POA: Diagnosis not present

## 2020-06-22 DIAGNOSIS — M2550 Pain in unspecified joint: Secondary | ICD-10-CM | POA: Diagnosis not present

## 2020-06-22 DIAGNOSIS — R600 Localized edema: Secondary | ICD-10-CM | POA: Diagnosis not present

## 2020-06-22 DIAGNOSIS — T8451XD Infection and inflammatory reaction due to internal right hip prosthesis, subsequent encounter: Secondary | ICD-10-CM | POA: Diagnosis not present

## 2020-06-22 DIAGNOSIS — W19XXXA Unspecified fall, initial encounter: Secondary | ICD-10-CM | POA: Diagnosis not present

## 2020-06-22 DIAGNOSIS — Z87891 Personal history of nicotine dependence: Secondary | ICD-10-CM

## 2020-06-22 DIAGNOSIS — Z96641 Presence of right artificial hip joint: Secondary | ICD-10-CM | POA: Diagnosis present

## 2020-06-22 DIAGNOSIS — R488 Other symbolic dysfunctions: Secondary | ICD-10-CM | POA: Diagnosis not present

## 2020-06-22 DIAGNOSIS — I129 Hypertensive chronic kidney disease with stage 1 through stage 4 chronic kidney disease, or unspecified chronic kidney disease: Secondary | ICD-10-CM | POA: Diagnosis present

## 2020-06-22 DIAGNOSIS — Z20822 Contact with and (suspected) exposure to covid-19: Secondary | ICD-10-CM | POA: Diagnosis present

## 2020-06-22 DIAGNOSIS — Z66 Do not resuscitate: Secondary | ICD-10-CM | POA: Diagnosis present

## 2020-06-22 DIAGNOSIS — M353 Polymyalgia rheumatica: Secondary | ICD-10-CM | POA: Diagnosis present

## 2020-06-22 DIAGNOSIS — W19XXXD Unspecified fall, subsequent encounter: Secondary | ICD-10-CM | POA: Diagnosis not present

## 2020-06-22 DIAGNOSIS — E669 Obesity, unspecified: Secondary | ICD-10-CM | POA: Diagnosis present

## 2020-06-22 DIAGNOSIS — L8990 Pressure ulcer of unspecified site, unspecified stage: Secondary | ICD-10-CM | POA: Insufficient documentation

## 2020-06-22 DIAGNOSIS — Z6830 Body mass index (BMI) 30.0-30.9, adult: Secondary | ICD-10-CM

## 2020-06-22 DIAGNOSIS — M25529 Pain in unspecified elbow: Secondary | ICD-10-CM

## 2020-06-22 DIAGNOSIS — G4700 Insomnia, unspecified: Secondary | ICD-10-CM | POA: Diagnosis not present

## 2020-06-22 DIAGNOSIS — Z90710 Acquired absence of both cervix and uterus: Secondary | ICD-10-CM

## 2020-06-22 DIAGNOSIS — L89152 Pressure ulcer of sacral region, stage 2: Secondary | ICD-10-CM | POA: Diagnosis present

## 2020-06-22 DIAGNOSIS — I959 Hypotension, unspecified: Secondary | ICD-10-CM | POA: Diagnosis present

## 2020-06-22 DIAGNOSIS — R55 Syncope and collapse: Secondary | ICD-10-CM | POA: Diagnosis not present

## 2020-06-22 DIAGNOSIS — M6281 Muscle weakness (generalized): Secondary | ICD-10-CM | POA: Diagnosis not present

## 2020-06-22 DIAGNOSIS — Z043 Encounter for examination and observation following other accident: Secondary | ICD-10-CM | POA: Diagnosis not present

## 2020-06-22 LAB — CK: Total CK: 95 U/L (ref 38–234)

## 2020-06-22 LAB — URINALYSIS, COMPLETE (UACMP) WITH MICROSCOPIC
Bacteria, UA: NONE SEEN
Bilirubin Urine: NEGATIVE
Glucose, UA: NEGATIVE mg/dL
Hgb urine dipstick: NEGATIVE
Ketones, ur: NEGATIVE mg/dL
Leukocytes,Ua: NEGATIVE
Nitrite: NEGATIVE
Protein, ur: 30 mg/dL — AB
Specific Gravity, Urine: 1.021 (ref 1.005–1.030)
pH: 5 (ref 5.0–8.0)

## 2020-06-22 LAB — CBC WITH DIFFERENTIAL/PLATELET
Abs Immature Granulocytes: 0.03 10*3/uL (ref 0.00–0.07)
Basophils Absolute: 0 10*3/uL (ref 0.0–0.1)
Basophils Relative: 0 %
Eosinophils Absolute: 0 10*3/uL (ref 0.0–0.5)
Eosinophils Relative: 0 %
HCT: 34.4 % — ABNORMAL LOW (ref 36.0–46.0)
Hemoglobin: 11.2 g/dL — ABNORMAL LOW (ref 12.0–15.0)
Immature Granulocytes: 0 %
Lymphocytes Relative: 18 %
Lymphs Abs: 1.6 10*3/uL (ref 0.7–4.0)
MCH: 31.5 pg (ref 26.0–34.0)
MCHC: 32.6 g/dL (ref 30.0–36.0)
MCV: 96.6 fL (ref 80.0–100.0)
Monocytes Absolute: 0.7 10*3/uL (ref 0.1–1.0)
Monocytes Relative: 8 %
Neutro Abs: 6.6 10*3/uL (ref 1.7–7.7)
Neutrophils Relative %: 74 %
Platelets: 289 10*3/uL (ref 150–400)
RBC: 3.56 MIL/uL — ABNORMAL LOW (ref 3.87–5.11)
RDW: 16.8 % — ABNORMAL HIGH (ref 11.5–15.5)
WBC: 9 10*3/uL (ref 4.0–10.5)
nRBC: 0.2 % (ref 0.0–0.2)

## 2020-06-22 LAB — COMPREHENSIVE METABOLIC PANEL
ALT: 25 U/L (ref 0–44)
AST: 28 U/L (ref 15–41)
Albumin: 2.5 g/dL — ABNORMAL LOW (ref 3.5–5.0)
Alkaline Phosphatase: 102 U/L (ref 38–126)
Anion gap: 12 (ref 5–15)
BUN: 36 mg/dL — ABNORMAL HIGH (ref 8–23)
CO2: 17 mmol/L — ABNORMAL LOW (ref 22–32)
Calcium: 8.6 mg/dL — ABNORMAL LOW (ref 8.9–10.3)
Chloride: 109 mmol/L (ref 98–111)
Creatinine, Ser: 1.31 mg/dL — ABNORMAL HIGH (ref 0.44–1.00)
GFR, Estimated: 40 mL/min — ABNORMAL LOW (ref >60)
Glucose, Bld: 93 mg/dL (ref 70–99)
Potassium: 4.8 mmol/L (ref 3.5–5.1)
Sodium: 138 mmol/L (ref 135–145)
Total Bilirubin: 1.5 mg/dL — ABNORMAL HIGH (ref 0.3–1.2)
Total Protein: 5.5 g/dL — ABNORMAL LOW (ref 6.5–8.1)

## 2020-06-22 LAB — TSH
TSH: UNDETERMINED u[IU]/mL (ref 0.350–4.500)
TSH: 1.765 u[IU]/mL (ref 0.350–4.500)

## 2020-06-22 LAB — RESP PANEL BY RT-PCR (FLU A&B, COVID) ARPGX2
Influenza A by PCR: NEGATIVE
Influenza B by PCR: NEGATIVE
SARS Coronavirus 2 by RT PCR: NEGATIVE

## 2020-06-22 LAB — TROPONIN I (HIGH SENSITIVITY): Troponin I (High Sensitivity): 15 ng/L (ref <18)

## 2020-06-22 LAB — MAGNESIUM: Magnesium: 2.2 mg/dL (ref 1.7–2.4)

## 2020-06-22 MED ORDER — LISINOPRIL 10 MG PO TABS: 20.0000 mg | ORAL_TABLET | Freq: Every day | ORAL | Status: DC

## 2020-06-22 MED ORDER — SODIUM CHLORIDE 0.9 % IV SOLN: Freq: Once | INTRAVENOUS | Status: AC

## 2020-06-22 MED ORDER — DOXYCYCLINE HYCLATE 100 MG PO TABS
100.0000 mg | ORAL_TABLET | Freq: Two times a day (BID) | ORAL | Status: DC
  Administered 2020-06-22 – 2020-06-27 (×11): 100 mg via ORAL
  Filled 2020-06-22 (×11): qty 1

## 2020-06-22 MED ORDER — LACTATED RINGERS IV BOLUS
1000.0000 mL | Freq: Once | INTRAVENOUS | Status: AC
  Administered 2020-06-22: 1000 mL via INTRAVENOUS

## 2020-06-22 MED ORDER — ADULT MULTIVITAMIN W/MINERALS CH
1.0000 | ORAL_TABLET | Freq: Every day | ORAL | Status: DC
  Filled 2020-06-22: qty 1

## 2020-06-22 MED ORDER — METOPROLOL TARTRATE 50 MG PO TABS
50.0000 mg | ORAL_TABLET | Freq: Two times a day (BID) | ORAL | Status: DC
  Administered 2020-06-22 – 2020-06-26 (×10): 50 mg via ORAL
  Filled 2020-06-22 (×11): qty 1

## 2020-06-22 MED ORDER — ADULT MULTIVITAMIN W/MINERALS CH
1.0000 | ORAL_TABLET | Freq: Every day | ORAL | Status: DC
  Administered 2020-06-22 – 2020-06-27 (×6): 1 via ORAL
  Filled 2020-06-22 (×6): qty 1

## 2020-06-22 MED ORDER — LISINOPRIL 20 MG PO TABS
20.0000 mg | ORAL_TABLET | Freq: Every day | ORAL | Status: DC
  Administered 2020-06-23 – 2020-06-27 (×5): 20 mg via ORAL
  Filled 2020-06-22: qty 1
  Filled 2020-06-22: qty 2
  Filled 2020-06-22 (×3): qty 1

## 2020-06-22 NOTE — ED Triage Notes (Addendum)
Pt to ED via [facility] from home. Per EMS pt seen here yesterday for fall but left because it was taking too long for SW. EMS called again due to increase weakness. Pt stating she feels incapacitated to care for herself. Pt and son requesting possible SNF placement. Pt with LLQ colostomy in place.

## 2020-06-22 NOTE — ED Notes (Signed)
Pt provided with meal tray. Pt denies any need for assistance with eating.

## 2020-06-22 NOTE — ED Notes (Signed)
Pt states that urine is leaking around foley catheter. Catheter tubing repositioned and no longer leaking. Pt's gown and linens changed. Pt's colostomy bag also changed at this time.

## 2020-06-22 NOTE — Evaluation (Signed)
Occupational Therapy Evaluation Patient Details Name: Tina [name] MRN: [id number] DOB: [birth year] Today's Date: 06/22/2020    History of Present Illness Pt is an 86 y.o. female presenting to ED 11/23 for fall (legs gave out when son helping into car and landed on bottom) and generalized weakness; pt left ED and returned to ED 11/24 d/t weakness and difficulty with mobility.  PMH includes htn, CKD, polymyalgia rheumatica, L TKR, h/o R hip surgery, LLQ colostomy.   Clinical Impression   Pt was seen for OT evaluation this date. Prior to hospital admission, pt was independent until more recently with functional decline over the past week per pt report, son at end reporting over the past couple months gradual decline. Pt appeared a little anxious, encouragement and active listening provided with pt reporting appreciation and better understanding of process in ED and next steps. Pt lives alone and reports decreased caregiver assist available. Currently pt demonstrates impairments as described below (See OT problem list) which functionally limit her ability to perform ADL/self-care tasks. Pt currently requires CGA for ADL transfers with RW from elevated surfaces and MOD A for LB ADL tasks. VSS throughout. Pt would benefit from skilled OT services to address noted impairments and functional limitations (see below for any additional details) in order to maximize safety and independence while minimizing falls risk and caregiver burden. Pt unsafe to return home alone at this time given significant change in functional status and limited assist/support available. Upon hospital discharge, recommend STR to maximize pt safety and return to PLOF.     Follow Up Recommendations  SNF    Equipment Recommendations  3 in 1 bedside commode    Recommendations for Other Services       Precautions / Restrictions Precautions Precautions: Fall Restrictions Weight Bearing Restrictions: No      Mobility Bed  Mobility Overal bed mobility: Needs Assistance Bed Mobility: Sit to Supine     Supine to sit: Min assist;Mod assist;HOB elevated Sit to supine: Mod assist   General bed mobility comments: assist for BLE and trunk/shoulders for alignment in bed    Transfers Overall transfer level: Needs assistance Equipment used: Rolling walker (2 wheeled) Transfers: Sit to/from Stand Sit to Stand: Min guard;From elevated surface         General transfer comment: x4 trials standing from elevated stretcher ED bed    Balance Overall balance assessment: Needs assistance Sitting-balance support: No upper extremity supported;Feet supported Sitting balance-Leahy Scale: Good Sitting balance - Comments: steady sitting reaching within BOS   Standing balance support: Single extremity supported Standing balance-Leahy Scale: Fair Standing balance comment: pt requiring at least single UE support for static standing balance                           ADL either performed or assessed with clinical judgement   ADL Overall ADL's : Needs assistance/impaired                                       General ADL Comments: Pt currently requiring at least MOD A for LB ADL tasks, CGA for ADL transfers from elevated surfaces, fatigues quickly     Vision         Perception     Praxis      Pertinent Vitals/Pain Pain Assessment: No/denies pain     Hand Dominance Right  Extremity/Trunk Assessment Upper Extremity Assessment Upper Extremity Assessment: Generalized weakness   Lower Extremity Assessment Lower Extremity Assessment: Generalized weakness   Cervical / Trunk Assessment Cervical / Trunk Assessment: Other exceptions Cervical / Trunk Exceptions: forward head/shoulders   Communication Communication Communication: HOH   Cognition Arousal/Alertness: Awake/alert Behavior During Therapy: Anxious Overall Cognitive Status: Within Functional Limits for tasks assessed                                      General Comments  B LE edema noted above ankles    Exercises     Shoulder Instructions      Home Living Family/patient expects to be discharged to:: Private residence Living Arrangements: Alone Available Help at Discharge: Family;Available PRN/intermittently Type of Home: House Home Access: Ramped entrance     Home Layout: Two level;Able to live on main level with bedroom/bathroom     Bathroom Shower/Tub: Tub/shower unit (takes sponge baths)   Bathroom Toilet: Standard     Home Equipment: Walker - 4 wheels          Prior Functioning/Environment Level of Independence: Independent with assistive device(s)        Comments: Ambulatory with 4ww; h/o fall but pt unsure how long ago; pt reporting significant decline in function over the past week +  (son present at end of session and reports gradual change in functional status over last couple months)        OT Problem List: Decreased strength;Decreased safety awareness;Decreased activity tolerance;Impaired balance (sitting and/or standing);Decreased knowledge of use of DME or AE      OT Treatment/Interventions: Self-care/ADL training;Therapeutic exercise;Therapeutic activities;DME and/or AE instruction;Energy conservation;Balance training;Patient/family education    OT Goals(Current goals can be found in the care plan section) Acute Rehab OT Goals Patient Stated Goal: to go to rehab to improve strength and mobility OT Goal Formulation: With patient Time For Goal Achievement: 07/06/20 Potential to Achieve Goals: Good ADL Goals Pt Will Perform Lower Body Dressing: with min assist;sit to/from stand;with adaptive equipment Pt Will Transfer to Toilet: with supervision;ambulating (comfort height toilet, rails or grab bar, LRAD for amb) Additional ADL Goal #1: Pt will verbalize plan to implement at least 1 learned falls prevention strategy to maximize safety/indep  OT  Frequency: Min 1X/week   Barriers to D/C: Decreased caregiver support          Co-evaluation              AM-PAC OT "6 Clicks" Daily Activity     Outcome Measure Help from another person eating meals?: None Help from another person taking care of personal grooming?: A Little Help from another person toileting, which includes using toliet, bedpan, or urinal?: A Little Help from another person bathing (including washing, rinsing, drying)?: A Lot Help from another person to put on and taking off regular upper body clothing?: A Little Help from another person to put on and taking off regular lower body clothing?: A Lot 6 Click Score: 17   End of Session    Activity Tolerance: Patient tolerated treatment well Patient left: in bed;with family/visitor present  OT Visit Diagnosis: Other abnormalities of gait and mobility (R26.89);Muscle weakness (generalized) (M62.81);History of falling (Z91.81)                Time: 0630-1601 OT Time Calculation (min): 19 min Charges:  OT General Charges $OT Visit: 1 Visit OT Evaluation $OT Eval Moderate  Complexity: 1 Mod  Jeni Salles, MPH, MS, OTR/L ascom [phone] 06/22/20, 4:49 PM

## 2020-06-22 NOTE — Progress Notes (Signed)
[facility] consulted to obtain IV access and collect labs.  Pt with possible discharge after evaluation by PT.  Contacted pt's nurse and physician to determine if IV truly needed as this patient has CKD. Physician would like to hydrate d/t CKI. V[facility] RN to assess patient's vasculature and obtain labs with IV access as able.

## 2020-06-22 NOTE — Evaluation (Signed)
Physical Therapy Evaluation Patient Details Name: Tina [name] MRN: [id number] DOB: [birth year] Today's Date: 06/22/2020   History of Present Illness  Pt is an 86 y.o. female presenting to ED 11/23 for fall (legs gave out when son helping into car and landed on bottom) and generalized weakness; pt left ED and returned to ED 11/24 d/t weakness and difficulty with mobility.  PMH includes htn, CKD, polymyalgia rheumatica, L TKR, h/o R hip surgery, LLQ colostomy.  Clinical Impression  Prior to hospital admission, pt was modified independent ambulating with 4ww; lives alone and stays on main level of home with ramp to enter.  Currently pt is min to mod assist semi-supine to sitting edge of bed; CGA to stand up to RW from elevated ED stretcher bed with increased effort multiple trials; and CGA to ambulate 12 feet and then 10 feet with RW (after sitting rest break).  Limited distance ambulating d/t generalized weakness, fatigue, and SOB with activity.  D/t impaired activity tolerance and generalized weakness, pt does not appear safe to discharge home alone.  Pt would benefit from skilled PT to address noted impairments and functional limitations (see below for any additional details).  Upon hospital discharge, pt would benefit from STR.    Follow Up Recommendations SNF    Equipment Recommendations  Rolling walker with 5" wheels;3in1 (PT)    Recommendations for Other Services       Precautions / Restrictions Precautions Precautions: Fall Restrictions Weight Bearing Restrictions: No      Mobility  Bed Mobility Overal bed mobility: Needs Assistance Bed Mobility: Supine to Sit     Supine to sit: Min assist;Mod assist;HOB elevated     General bed mobility comments: assist for R LE and trunk semi-supine to sitting edge of bed    Transfers Overall transfer level: Needs assistance Equipment used: Rolling walker (2 wheeled) Transfers: Sit to/from Stand Sit to Stand: Min guard;From  elevated surface         General transfer comment: x4 trials standing from elevated stretcher ED bed  Ambulation/Gait Ambulation/Gait assistance: Min guard Gait Distance (Feet):  (12 feet; 10 feet) Assistive device: Rolling walker (2 wheeled)   Gait velocity: decreased   General Gait Details: decreased B LE step length/foot clearance/heelstrike; mild forward posture; limited distance d/t fatigue and SOB  Stairs            Wheelchair Mobility    Modified Rankin (Stroke Patients Only)       Balance Overall balance assessment: Needs assistance Sitting-balance support: No upper extremity supported;Feet supported Sitting balance-Leahy Scale: Good Sitting balance - Comments: steady sitting reaching within BOS   Standing balance support: Single extremity supported Standing balance-Leahy Scale: Fair Standing balance comment: pt requiring at least single UE support for static standing balance                             Pertinent Vitals/Pain Pain Assessment: No/denies pain  Vitals (HR and O2 on room air) stable and WFL throughout treatment session.    Home Living Family/patient expects to be discharged to:: Private residence Living Arrangements: Alone Available Help at Discharge: Family;Available PRN/intermittently Type of Home: House Home Access: Ramped entrance     Home Layout: Two level;Able to live on main level with bedroom/bathroom Home Equipment: Walker - 4 wheels      Prior Function Level of Independence: Independent with assistive device(s)         Comments: Ambulatory with  4ww; h/o fall but pt unsure how long ago     Hand Dominance        Extremity/Trunk Assessment   Upper Extremity Assessment Upper Extremity Assessment: Defer to OT evaluation    Lower Extremity Assessment Lower Extremity Assessment: Generalized weakness    Cervical / Trunk Assessment Cervical / Trunk Assessment:  (forward head/shoulders)  Communication    Communication: HOH  Cognition Arousal/Alertness: Awake/alert Behavior During Therapy: Anxious Overall Cognitive Status: Within Functional Limits for tasks assessed                                        General Comments General comments (skin integrity, edema, etc.): B LE edema noted above ankles.  Nursing cleared pt for participation in physical therapy.  Pt agreeable to PT session.    Exercises  Transfers and ambulation   Assessment/Plan    PT Assessment Patient needs continued PT services  PT Problem List Decreased strength;Decreased activity tolerance;Decreased balance;Decreased mobility;Decreased knowledge of use of DME;Cardiopulmonary status limiting activity       PT Treatment Interventions DME instruction;Gait training;Functional mobility training;Therapeutic activities;Therapeutic exercise;Balance training;Patient/family education    PT Goals (Current goals can be found in the Care Plan section)  Acute Rehab PT Goals Patient Stated Goal: to improve strength and mobility PT Goal Formulation: With patient Time For Goal Achievement: 07/06/20 Potential to Achieve Goals: Good    Frequency Min 2X/week   Barriers to discharge Decreased caregiver support      Co-evaluation               AM-PAC PT "6 Clicks" Mobility  Outcome Measure Help needed turning from your back to your side while in a flat bed without using bedrails?: None Help needed moving from lying on your back to sitting on the side of a flat bed without using bedrails?: A Little Help needed moving to and from a bed to a chair (including a wheelchair)?: A Little Help needed standing up from a chair using your arms (e.g., wheelchair or bedside chair)?: A Little Help needed to walk in hospital room?: A Little Help needed climbing 3-5 steps with a railing? : A Lot 6 Click Score: 18    End of Session Equipment Utilized During Treatment: Gait belt Activity Tolerance: Patient limited by  fatigue Patient left:  (sitting on edge of ED stretcher bed with OT present for OT eval) Nurse Communication: Mobility status;Precautions (MD notified of PT recommendations) PT Visit Diagnosis: Other abnormalities of gait and mobility (R26.89);Muscle weakness (generalized) (M62.81);History of falling (Z91.81);Difficulty in walking, not elsewhere classified (R26.2)    Time: 3536-1443 PT Time Calculation (min) (ACUTE ONLY): 23 min   Charges:   PT Evaluation $PT Eval Low Complexity: 1 Low PT Treatments $Therapeutic Activity: 8-22 mins       Leitha Bleak, PT 06/22/20, 4:14 PM

## 2020-06-22 NOTE — ED Provider Notes (Signed)
[facility] Emergency Department Provider Note  ____________________________________________   First MD Initiated Contact with Patient 06/22/20 1211     (approximate)  I have reviewed the triage vital signs and the nursing notes.   HISTORY  Chief Complaint Fall (SW placement )   HPI Tina [name] is a 86 y.o. female with a past medical history of right hip replacement in 5643 complicated by chronic infection on doxycycline, total knee replacement left in 2004, HTN, CKD, and polymyalgia rheumatica who presents via EMS from home for assessment of inability to care for self per patient.  Patient states she was here yesterday after a fall and that she has been extremely weak and unable to care for self over the last month and think she should have stayed but wanted to go home yesterday and now realizes that she is unable to take care of herself at home.  She states that she has been unable to walk for at least several weeks if not longer and that when she fell yesterday she only fell onto her bottom and did not strike her head or have L of C.  She is not on any blood thinners.  She denies any other specific or constitutional symptoms including headache, earache, sore throat, congestion, fevers, chills, cough, vomiting, diarrhea, dysuria, chest pain, dental pain, back pain or extremity pain.  No other recent falls or injuries per patient.  No other acute concerns at this time other than generalized weakness.         Past Medical History:  Diagnosis Date  . Hypertension     Patient Active Problem List   Diagnosis Date Noted  . Prosthetic joint infection of left hip ([place]) 11/23/2019  . Incontinence in female 11/23/2019  . Senile purpura ([place]) 04/24/2019  . CKD (chronic kidney disease) 06/08/2015  . Clinical depression 06/08/2015  . BP (high blood pressure) 06/08/2015  . Cannot sleep 06/08/2015  . Venous [place] 06/08/2015  . Adynamia 06/08/2015  .  Polymyalgia rheumatica ([place]) 05/27/2013  . Difficulty hearing 07/08/2009  . Accumulation of fluid in tissues 02/14/2009  . Menopausal and perimenopausal disorder 02/14/2009  . Excessive urination at night 02/14/2009  . Anarthritic rheumatoid disease ([place]) 02/14/2009  . Colon, diverticulosis 08/19/2003  . Diaphragmatic hernia 08/19/2003  . Female genuine stress incontinence 08/19/2002  . Arthritis, degenerative 01/20/2001  . Adiposity 12/21/1994    Past Surgical History:  Procedure Laterality Date  . ABDOMINAL HYSTERECTOMY  1980's  . COLON SURGERY  06/08/13   s/p ruptured diverticulitis, has fistula; had TPN for 3 mths  . DILATION AND CURETTAGE OF UTERUS  1970's  . HIP SURGERY Right 06/01/2013  . KNEE SURGERY Left 2004   total knee replacement     Prior to Admission medications   Medication Sig Start Date End Date Taking? Authorizing Provider  diphenhydramine-acetaminophen (TYLENOL PM) 25-500 MG TABS tablet Take 1 tablet by mouth at bedtime as needed.    [provider]  doxycycline (VIBRAMYCIN) 100 MG capsule Take 1 capsule (100 mg total) by mouth 2 (two) times daily. 05/30/20   Tsosie Billing, MD  lisinopril (ZESTRIL) 20 MG tablet Take 1 tablet (20 mg total) by mouth daily. 04/24/19   Bacigalupo, Dionne Bucy, MD  metoprolol tartrate (LOPRESSOR) 50 MG tablet Take 1 tablet (50 mg total) by mouth 2 (two) times daily. 04/24/19   Virginia Crews, MD  Multiple Vitamin (MULTIVITAMIN) tablet Take 1 tablet by mouth daily.    [provider]  predniSONE (DELTASONE) 1 MG tablet TAKE 3 TABLETS (3 MG TOTAL) BY MOUTH DAILY WITH BREAKFAST. 01/16/20   Virginia Crews, MD  triamcinolone ointment (KENALOG) 0.5 % Apply 1 application topically 2 (two) times daily. 11/23/19   Virginia Crews, MD    Allergies Erythromycin  Family History  Problem Relation Age of Onset  . Cancer Mother        breast    Social History Social History   Tobacco Use  . Smoking  status: Former Smoker    Quit date: 07/30/1979    Years since quitting: 40.9  . Smokeless tobacco: Never Used  Vaping Use  . Vaping Use: Never used  Substance Use Topics  . Alcohol use: No    Comment: 0-1 scotch drink(s) a month  . Drug use: No    Review of Systems  Review of Systems  Constitutional: Positive for malaise/fatigue. Negative for chills and fever.  HENT: Negative for sore throat.   Eyes: Negative for pain.  Respiratory: Negative for cough and stridor.   Cardiovascular: Negative for chest pain.  Gastrointestinal: Negative for vomiting.  Genitourinary: Negative for dysuria.  Musculoskeletal: Negative for myalgias.  Skin: Negative for rash.  Neurological: Positive for weakness. Negative for seizures, loss of consciousness and headaches.  Psychiatric/Behavioral: Negative for suicidal ideas.  All other systems reviewed and are negative.     ____________________________________________   PHYSICAL EXAM:  VITAL SIGNS: ED Triage Vitals  Enc Vitals Group     BP 06/22/20 1210 121/68     Pulse Rate 06/22/20 1210 88     Resp 06/22/20 1210 18     Temp 06/22/20 1210 97.7 F (36.5 C)     Temp Source 06/22/20 1210 Oral     SpO2 06/22/20 1210 100 %     Weight 06/22/20 1209 180 lb (81.6 kg)     Height 06/22/20 1209 5\' 4"  (1.626 m)     Head Circumference --      Peak Flow --      Pain Score 06/22/20 1209 0     Pain Loc --      Pain Edu? --      Excl. in [place]? --    Vitals:   06/22/20 1210  BP: 121/68  Pulse: 88  Resp: 18  Temp: 97.7 F (36.5 C)  SpO2: 100%   Physical Exam Vitals and nursing note reviewed.  Constitutional:      General: She is not in acute distress.    Appearance: She is well-developed.  HENT:     Head: Normocephalic and atraumatic.     Right Ear: External ear normal.     Left Ear: External ear normal.  Eyes:     Conjunctiva/sclera: Conjunctivae normal.  Cardiovascular:     Rate and Rhythm: Normal rate and regular rhythm.     Heart  sounds: No murmur heard.   Pulmonary:     Effort: Pulmonary effort is normal. No respiratory distress.     Breath sounds: Normal breath sounds.  Abdominal:     Palpations: Abdomen is soft.     Tenderness: There is no abdominal tenderness.  Musculoskeletal:     Cervical back: Neck supple.  Skin:    General: Skin is warm and dry.     Capillary Refill: Capillary refill takes less than 2 seconds.  Neurological:     Mental Status: She is alert and oriented to person, place, and time.  Psychiatric:        Mood and  Affect: Mood normal.     PERRLA.  EOMI.  Patient is oriented x3.  She has full strength in about upper extremities but is very weak in her bilateral lower extremities 1 week on her right lower extremity with the right lower extremity internally rotated which is chronic per patient.  2+ bilateral radial and DP pulses.  Sensation intact to light touch of all extremities.  Some ecchymosis over the bilateral forearms and dorsum of both hands but no other trauma to the extremities visualized. ____________________________________________   LABS (all labs ordered are listed, but only abnormal results are displayed)  Labs Reviewed  CBC WITH DIFFERENTIAL/PLATELET - Abnormal; Notable for the following components:      Result Value   RBC 3.56 (*)    Hemoglobin 11.2 (*)    HCT 34.4 (*)    RDW 16.8 (*)    All other components within normal limits  COMPREHENSIVE METABOLIC PANEL - Abnormal; Notable for the following components:   CO2 17 (*)    BUN 36 (*)    Creatinine, Ser 1.31 (*)    Calcium 8.6 (*)    Total Protein 5.5 (*)    Albumin 2.5 (*)    Total Bilirubin 1.5 (*)    GFR, Estimated 40 (*)    All other components within normal limits  URINALYSIS, COMPLETE (UACMP) WITH MICROSCOPIC - Abnormal; Notable for the following components:   Color, Urine YELLOW (*)    APPearance CLEAR (*)    Protein, ur 30 (*)    All other components within normal limits  RESP PANEL BY RT-PCR (FLU  A&B, COVID) ARPGX2  MAGNESIUM  CK  TSH  TSH   ____________________________________________  EKG  Reviewed from yesterday's visit and at that time showed sinus rhythm with a ventricular rate of 69 and some nonspecific T wave changes in the anterolateral leads. ____________________________________________  RADIOLOGY  ED MD interpretation: As noted below this is reviewed from yesterday's visit and showed no acute cardiopulmonary process.  Official radiology report(s): DG Chest 2 View  Result Date: 06/21/2020 CLINICAL DATA:  Pain status post fall. EXAM: CHEST - 2 VIEW COMPARISON:  October 09, 2015 FINDINGS: The heart size and mediastinal contours are within normal limits. Both lungs are clear. The visualized skeletal structures are unremarkable. Atherosclerotic changes are noted of the thoracic aorta. The thoracic aorta is tortuous. IMPRESSION: No active cardiopulmonary disease. Electronically Signed   By: Constance Holster M.D.   On: 06/21/2020 22:22   DG Pelvis Portable  Result Date: 06/22/2020 CLINICAL DATA:  Fall. EXAM: PORTABLE PELVIS 1-2 VIEWS COMPARISON:  None. FINDINGS: There is no evidence of pelvic fracture or diastasis. No pelvic bone lesions are seen. Status post right total hip arthroplasty. IMPRESSION: Negative. Electronically Signed   By: Marijo Conception M.D.   On: 06/22/2020 13:19    ____________________________________________   PROCEDURES  Procedure(s) performed (including Critical Care):  Procedures   ____________________________________________   INITIAL IMPRESSION / ASSESSMENT AND PLAN / ED COURSE       Patient presents with [place] to history exam complaining of generalized weakness and fatigue that states has been present at least over the past month.  Patient states on arrival she think she needs to be placed in a nursing home she is not able to care for self at home.  On arrival she is afebrile hemodynamically stable.  She has a nonfocal neuro exam.  I  did review her visit yesterday after reported fall at that time she was noted  to have a chest x-ray that was unremarkable as well as a nonelevated troponin and a CBC and BMP were obtained.  CBC obtained yesterday shows evidence of anemia with hemoglobin of 11.5 compared to 13.32 years ago.  No other significant derangements on CBC.  BMP obtained yesterday shows evidence of an AKI with a creatinine of 1.55 compared to 0.997 months ago no other significant derangements.  Patient was given IV fluids yesterday had improvement in initial soft blood pressures before discharge.  Labs today show stable hemoglobin 11.2 compared to 11.5 yesterday with no other significant CBC derangements.  CMP remarkable for improving kidney function with a creatinine of 1.3 compared to 1.55 yesterday with no significant electrolyte or significant metabolic derangements with exception of bicarb of 17 although patient's anion gap is 12.  Magnesium, CK, and TSH are all within normal limits.  Covid is negative.  Pelvis x-ray obtained as noted above given patient reported falling to her bottom yesterday is unremarkable for any evidence of fracture.  We will plan to assess for evidence of UTI with urine contributing to presentation.  However after several hours patient unable via urine sample and bedside bladder scan remarkable for greater than 300 cc of urine.  Patient did consent to have Foley placed.  Is possible she is having some retention related to anticholinergic effects from Benadryl versus other etiologies.  UA does not appear infected.  Per PT OT consults they recommend SNF placement.  Social work consulted for SNF placement.  Medications reordered. ____________________________________________   FINAL CLINICAL IMPRESSION(S) / ED DIAGNOSES  Final diagnoses:  Weakness    Medications  metoprolol tartrate (LOPRESSOR) tablet 50 mg (50 mg Oral Given 06/22/20 1427)  lisinopril (ZESTRIL) tablet 20 mg (has no administration in  time range)  doxycycline (VIBRA-TABS) tablet 100 mg (100 mg Oral Given 06/22/20 1427)  multivitamin with minerals tablet 1 tablet (1 tablet Oral Given 06/22/20 1427)  lactated ringers bolus 1,000 mL (1,000 mLs Intravenous New Bag/Given 06/22/20 1530)     ED Discharge Orders    None       Note:  This document was prepared using Dragon voice recognition software and may include unintentional dictation errors.   Lucrezia Starch, MD 06/22/20 [phone]

## 2020-06-22 NOTE — H&P (Signed)
[place]   PATIENT NAME: Tina [name]    MR#:  [id number]  DATE OF BIRTH:  08-07-1932  DATE OF ADMISSION:  06/22/2020  PRIMARY CARE PHYSICIAN: Virginia Crews, MD   REQUESTING/REFERRING PHYSICIAN: Merlyn Lot, MD CHIEF COMPLAINT:   Chief Complaint  Patient presents with  . Fall    SW placement     HISTORY OF PRESENT ILLNESS:  Tina [name]  is a 86 y.o. Caucasian female with a known history of hypertension, osteoarthritis status post right total hip arthroplasty in 8676 complicated by chronic infection for which she has been placed on p.o. doxycycline, polymyalgia rheumatica and chronic kidney disease, who presented to the emergency room with acute onset of failure to thrive and recent fall on Tuesday.  Patient was been having hypotension and presyncope.  She is becoming significantly weak and unable to care for herself over the last month.  She has not been able to walk for the last several weeks.  She denied having injury secondary to her fall.  She was seen yesterday in the ER and advised to be admitted but she declined.  She continued to have no much appetite and has been feeling weak and tired.  No fever or chills.  No nausea or vomiting or abdominal pain.  She has been having diminished urine output.  Upon presentation to the emergency room, vital signs were within normal.  Labs revealed BUN of 36 and creatinine 1.31 compared to 37 and 1.55 yesterday.  Potassium was 4.8.  Albumin was 2.5.  Total bili was 1.5.  Chest x-ray showed anemia at baseline.  COVID-19 PCR and influenza antigens came back negative.  Urinalysis showed 30 protein.  Two-view chest x-ray yesterday came back with no acute cardiopulmonary disease.  Pelvic x-ray came back negative today.  The patient was given 1 L bolus of IV lactated Ringer.  She will be admitted to a medical bed for further evaluation and management.   PAST MEDICAL HISTORY:   Past Medical History:  Diagnosis Date  .  Hypertension     PAST SURGICAL HISTORY:   Past Surgical History:  Procedure Laterality Date  . ABDOMINAL HYSTERECTOMY  1980's  . COLON SURGERY  06/08/13   s/p ruptured diverticulitis, has fistula; had TPN for 3 mths  . DILATION AND CURETTAGE OF UTERUS  1970's  . HIP SURGERY Right 06/01/2013  . KNEE SURGERY Left 2004   total knee replacement     SOCIAL HISTORY:   Social History   Tobacco Use  . Smoking status: Former Smoker    Quit date: 07/30/1979    Years since quitting: 40.9  . Smokeless tobacco: Never Used  Substance Use Topics  . Alcohol use: No    Comment: 0-1 scotch drink(s) a month    FAMILY HISTORY:   Family History  Problem Relation Age of Onset  . Cancer Mother        breast  Her father died from MI.  DRUG ALLERGIES:   Allergies  Allergen Reactions  . Erythromycin     REVIEW OF SYSTEMS:   ROS As per history of present illness. All pertinent systems were reviewed above. Constitutional, HEENT, cardiovascular, respiratory, GI, GU, musculoskeletal, neuro, psychiatric, endocrine, integumentary and hematologic systems were reviewed and are otherwise negative/unremarkable except for positive findings mentioned above in the HPI.   MEDICATIONS AT HOME:   Prior to Admission medications   Medication Sig Start Date End Date Taking? Authorizing Provider  diphenhydramine-acetaminophen (TYLENOL PM) 25-500  MG TABS tablet Take 1 tablet by mouth at bedtime as needed (pain or sleep).    Yes [provider]  doxycycline (VIBRAMYCIN) 100 MG capsule Take 1 capsule (100 mg total) by mouth 2 (two) times daily. 05/30/20  Yes Tsosie Billing, MD  lisinopril (ZESTRIL) 20 MG tablet Take 1 tablet (20 mg total) by mouth daily. 04/24/19  Yes Bacigalupo, Dionne Bucy, MD  metoprolol tartrate (LOPRESSOR) 50 MG tablet Take 1 tablet (50 mg total) by mouth 2 (two) times daily. 04/24/19  Yes Bacigalupo, Dionne Bucy, MD  Multiple Vitamin (MULTIVITAMIN) tablet Take 1 tablet by  mouth daily.   Yes [provider]      VITAL SIGNS:  Blood pressure (!) 134/52, pulse 88, temperature 97.7 F (36.5 C), temperature source Oral, resp. rate 18, height 5\' 4"  (1.626 m), weight 81.6 kg, SpO2 100 %.  PHYSICAL EXAMINATION:  Physical Exam  GENERAL:  86 y.o.-year-old Caucasian female patient lying in the bed with no acute distress.  EYES: Pupils equal, round, reactive to light and accommodation. No scleral icterus. Extraocular muscles intact.  HEENT: Head atraumatic, normocephalic. Oropharynx and nasopharynx clear.  NECK:  Supple, no jugular venous distention. No thyroid enlargement, no tenderness.  LUNGS: Normal breath sounds bilaterally, no wheezing, rales,rhonchi or crepitation. No use of accessory muscles of respiration.  CARDIOVASCULAR: Regular rate and rhythm, S1, S2 normal. No murmurs, rubs, or gallops.  ABDOMEN: Soft, nondistended, nontender. Bowel sounds present. No organomegaly or mass.  EXTREMITIES: 1+ bilateral lower lower extremity  edema, with no cyanosis, or clubbing.  NEUROLOGIC: Cranial nerves II through XII are intact. Muscle strength 5/5 in all extremities. Sensation intact. Gait not checked.  PSYCHIATRIC: The patient is alert and oriented x 3.  Normal affect and good eye contact. SKIN: No obvious rash, lesion, or ulcer.   LABORATORY PANEL:   CBC Recent Labs  Lab 06/22/20 1308  WBC 9.0  HGB 11.2*  HCT 34.4*  PLT 289   ------------------------------------------------------------------------------------------------------------------  Chemistries  Recent Labs  Lab 06/22/20 1308  NA 138  K 4.8  CL 109  CO2 17*  GLUCOSE 93  BUN 36*  CREATININE 1.31*  CALCIUM 8.6*  MG 2.2  AST 28  ALT 25  ALKPHOS 102  BILITOT 1.5*   ------------------------------------------------------------------------------------------------------------------  Cardiac Enzymes No results for input(s): TROPONINI in the last 168  hours. ------------------------------------------------------------------------------------------------------------------  RADIOLOGY:  DG Chest 2 View  Result Date: 06/21/2020 CLINICAL DATA:  Pain status post fall. EXAM: CHEST - 2 VIEW COMPARISON:  October 09, 2015 FINDINGS: The heart size and mediastinal contours are within normal limits. Both lungs are clear. The visualized skeletal structures are unremarkable. Atherosclerotic changes are noted of the thoracic aorta. The thoracic aorta is tortuous. IMPRESSION: No active cardiopulmonary disease. Electronically Signed   By: Constance Holster M.D.   On: 06/21/2020 22:22   DG Pelvis Portable  Result Date: 06/22/2020 CLINICAL DATA:  Fall. EXAM: PORTABLE PELVIS 1-2 VIEWS COMPARISON:  None. FINDINGS: There is no evidence of pelvic fracture or diastasis. No pelvic bone lesions are seen. Status post right total hip arthroplasty. IMPRESSION: Negative. Electronically Signed   By: Marijo Conception M.D.   On: 06/22/2020 13:19      IMPRESSION AND PLAN:   1.  Failure to thrive and recent fall. -The patient will be admitted to a medical bed. -Physical therapy consult to be obtained. -Case management evaluation will be obtained to consider skilled nursing facility placement.  2.  Acute kidney injury. -This is likely prerenal  secondary to volume depletion and dehydration due to anorexia. -The patient will be placed on hydration with IV normal saline and will follow BMP. -We will avoid nephrotoxins. -Dietitian consult to be obtained.  3.  Hypertension. -We will continue Lopressor and hold off Zestril given acute kidney injury.  4.  Chronic right hip infection. -We will continue p.o. doxycycline.  5.  DVT prophylaxis. -Subcutaneous Lovenox.    All the records are reviewed and case discussed with ED provider. The plan of care was discussed in details with the patient (and family). I answered all questions. The patient agreed to proceed with the  above mentioned plan. Further management will depend upon hospital course.   CODE STATUS: Full code  Status is: Inpatient  Remains inpatient appropriate because:Ongoing diagnostic testing needed not appropriate for outpatient work up, Unsafe d/c plan, IV treatments appropriate due to intensity of illness or inability to take PO and Inpatient level of care appropriate due to severity of illness   Dispo: The patient is from: Home              Anticipated d/c is to: SNF              Anticipated d/c date is: 3 days              Patient currently is not medically stable to d/c.   TOTAL TIME TAKING CARE OF THIS PATIENT: 55 minutes.    Christel Mormon M.D on 06/22/2020 at 11:38 PM  [facility]   From 7 PM-7 AM, contact night-coverage [email or web address]  CC: Primary care physician; Virginia Crews, MD

## 2020-06-22 NOTE — NC FL2 (Signed)
[place] LEVEL OF CARE SCREENING TOOL     IDENTIFICATION  Patient Name: Tina [name] Birthdate: 1933-03-08 Sex: female Admission Date (Current Location): 06/22/2020  [place] and [place] Number:  Engineering geologist and Address:  [facility], [address], [place], [place] [address]      Provider Number: [id number]  Attending Physician Name and Address:  Tina Starch, MD  Relative Name and Phone Number:  Tina, [name] (Son) [phone]    Current Level of Care: SNF Recommended Level of Care: [place] Prior Approval Number:    Date Approved/Denied:   PASRR Number: [id number]  Discharge Plan: SNF    Current Diagnoses: Patient Active Problem List   Diagnosis Date Noted  . Prosthetic joint infection of left hip ([place]) 11/23/2019  . Incontinence in female 11/23/2019  . Senile purpura ([place]) 04/24/2019  . CKD (chronic kidney disease) 06/08/2015  . Clinical depression 06/08/2015  . BP (high blood pressure) 06/08/2015  . Cannot sleep 06/08/2015  . Venous lake 06/08/2015  . Adynamia 06/08/2015  . Polymyalgia rheumatica ([place]) 05/27/2013  . Difficulty hearing 07/08/2009  . Accumulation of fluid in tissues 02/14/2009  . Menopausal and perimenopausal disorder 02/14/2009  . Excessive urination at night 02/14/2009  . Anarthritic rheumatoid disease ([place]) 02/14/2009  . Colon, diverticulosis 08/19/2003  . Diaphragmatic hernia 08/19/2003  . Female genuine stress incontinence 08/19/2002  . Arthritis, degenerative 01/20/2001  . Adiposity 12/21/1994    Orientation RESPIRATION BLADDER Height & Weight     Self, Time, Situation, Place  Normal Indwelling catheter, Continent Weight: 180 lb (81.6 kg) Height:  5\' 4"  (162.6 cm)  BEHAVIORAL SYMPTOMS/MOOD NEUROLOGICAL BOWEL NUTRITION STATUS      Colostomy Diet  AMBULATORY STATUS COMMUNICATION OF NEEDS Skin   Extensive Assist Verbally Normal                        Personal Care Assistance Level of Assistance  Bathing, Feeding, Dressing Bathing Assistance: Maximum assistance Feeding assistance: Independent Dressing Assistance: Maximum assistance     Functional Limitations Info  Sight, Hearing, Speech Sight Info: Adequate Hearing Info: Adequate Speech Info: Adequate    SPECIAL CARE FACTORS FREQUENCY  PT (By licensed PT), OT (By licensed OT)     PT Frequency: 5 times weekly OT Frequency: 5 times weekly            Contractures Contractures Info: Not present    Additional Factors Info  Allergies   Allergies Info: Erythromycin           Current Medications (06/22/2020):  This is the current hospital active medication list Current Facility-Administered Medications  Medication Dose Route Frequency Provider Last Rate Last Admin  . doxycycline (VIBRA-TABS) tablet 100 mg  100 mg Oral BID Tina Starch, MD   100 mg at 06/22/20 1427  . [START ON 06/23/2020] lisinopril (ZESTRIL) tablet 20 mg  20 mg Oral Daily Tina Starch, MD      . metoprolol tartrate (LOPRESSOR) tablet 50 mg  50 mg Oral BID Tina Starch, MD   50 mg at 06/22/20 1427  . multivitamin with minerals tablet 1 tablet  1 tablet Oral Daily Tina Starch, MD   1 tablet at 06/22/20 1427   Current Outpatient Medications  Medication Sig Dispense Refill  . diphenhydramine-acetaminophen (TYLENOL PM) 25-500 MG TABS tablet Take 1 tablet by mouth at bedtime as needed (pain or sleep).     Marland Kitchen doxycycline (VIBRAMYCIN)  100 MG capsule Take 1 capsule (100 mg total) by mouth 2 (two) times daily. 60 capsule 1  . lisinopril (ZESTRIL) 20 MG tablet Take 1 tablet (20 mg total) by mouth daily. 90 tablet 4  . metoprolol tartrate (LOPRESSOR) 50 MG tablet Take 1 tablet (50 mg total) by mouth 2 (two) times daily. 180 tablet 4  . Multiple Vitamin (MULTIVITAMIN) tablet Take 1 tablet by mouth daily.       Discharge Medications: Please see discharge summary for a list of discharge  medications.  Relevant Imaging Results:  Relevant Lab Results:   Additional Information SSN: [place] [address] [place]

## 2020-06-22 NOTE — TOC Initial Note (Addendum)
Transition of Care [facility]) - Initial/Assessment Note    Patient Details  Name: Tina [name] MRN: [id number] Date of Birth: [birth year]  Transition of Care [facility]) CM/SW Contact:    Iona Beard, [place] Phone Number: 06/22/2020, 7:23 PM  Clinical Narrative:                 [facility] received consult for SNF placement. CSW confirmed with pts RN that pt was alert and orientedx4 before completing assessment. CSW also spoke with pts son Shalla, [place] (Son) [phone] to gain collateral and update on plan for SNF placement, he is thankful for update and would like for further updates. Mr. Wilford states that pt lives alone but that he does live close by. Pt states that she has been having a harder time completing ADLs and that does not drive currently. Per Mr. Moffet pt has not had [place] services but pt does have a rollator walker to use when needed.   CSW spoke with pt about PT/OT recommendation for SNF. Pt had many questions concerning time frame and what referral process was and how long she would stay. CSW provided pt with education on referral process and the typical 1-3 week stay at [facility] for rehab. Pt understanding and wants referral sent out to local facilities in [place]. Pt states that her top choices are [facility] or [facility] however, she is willing for referral to be sent out to all facilities in [place]. CSW completed Fl2 with pt and RN. CSW sent Fl2 out to local facilities. Pt asking when she will go to SNF, CSW informed pt that referral will be sent out tonight (11/24) but that due to holidays it may take longer to hear back, however TOC will update whenever bed offers are made. Pt understanding. TOC to follow.   Expected Discharge Plan: Skilled Nursing Facility Barriers to Discharge: Continued Medical Work up, Other (comment) (ED SNF placement)   Patient Goals and CMS Choice Patient states their goals for this hospitalization and ongoing recovery are:: Go to SNF for rehab CMS  Medicare.gov Compare Post Acute Care list provided to:: Patient Choice offered to / list presented to : Patient  Expected Discharge Plan and Services Expected Discharge Plan: [place] In-house Referral: Clinical Social Work Discharge Planning Services: CM Consult Post Acute Care Choice: [place] arrangements for the past 2 months: Single Family Home                 DME Arranged: N/A DME Agency: NA       HH Arranged: NA HH Agency: NA        Prior Living Arrangements/Services Living arrangements for the past 2 months: Single Family Home Lives with:: Self Patient language and need for interpreter reviewed:: Yes Do you feel safe going back to the place where you live?: Yes        Care giver support system in place?: Yes (comment) (Betty,Edward L (Son) [phone]) Current home services: DME (Rollator) Criminal Activity/Legal Involvement Pertinent to Current Situation/Hospitalization: No - Comment as needed  Activities of Daily Living      Permission Sought/Granted                  Emotional Assessment Appearance:: Appears stated age Attitude/Demeanor/Rapport: Engaged Affect (typically observed): Accepting Orientation: : Oriented to Self, Oriented to  Time, Oriented to Place, Oriented to Situation Alcohol / Substance Use: Not Applicable Psych Involvement: No (comment)  Admission diagnosis:  weakness EMS Patient Active Problem List  Diagnosis Date Noted  . Prosthetic joint infection of left hip ([place]) 11/23/2019  . Incontinence in female 11/23/2019  . Senile purpura ([place]) 04/24/2019  . CKD (chronic kidney disease) 06/08/2015  . Clinical depression 06/08/2015  . BP (high blood pressure) 06/08/2015  . Cannot sleep 06/08/2015  . Venous lake 06/08/2015  . Adynamia 06/08/2015  . Polymyalgia rheumatica ([place]) 05/27/2013  . Difficulty hearing 07/08/2009  . Accumulation of fluid in tissues 02/14/2009  . Menopausal and  perimenopausal disorder 02/14/2009  . Excessive urination at night 02/14/2009  . Anarthritic rheumatoid disease ([place]) 02/14/2009  . Colon, diverticulosis 08/19/2003  . Diaphragmatic hernia 08/19/2003  . Female genuine stress incontinence 08/19/2002  . Arthritis, degenerative 01/20/2001  . Adiposity 12/21/1994   PCP:  Virginia Crews, MD Pharmacy:   [place], [place] [address] [place] [address] Phone: [phone] Fax: [place], [place] [address] [address] Phone: [phone] Fax: [phone]  [id number] [place], [place] [address] [place] [address] Phone: [phone] Fax: [phone]     Social Determinants of Health (SDOH) Interventions    Readmission Risk Interventions No flowsheet data found.

## 2020-06-23 ENCOUNTER — Encounter: Payer: Self-pay | Admitting: Family Medicine

## 2020-06-23 DIAGNOSIS — N179 Acute kidney failure, unspecified: Secondary | ICD-10-CM | POA: Diagnosis not present

## 2020-06-23 LAB — BASIC METABOLIC PANEL
Anion gap: 10 (ref 5–15)
BUN: 32 mg/dL — ABNORMAL HIGH (ref 8–23)
CO2: 21 mmol/L — ABNORMAL LOW (ref 22–32)
Calcium: 8.3 mg/dL — ABNORMAL LOW (ref 8.9–10.3)
Chloride: 109 mmol/L (ref 98–111)
Creatinine, Ser: 1.09 mg/dL — ABNORMAL HIGH (ref 0.44–1.00)
GFR, Estimated: 50 mL/min — ABNORMAL LOW (ref >60)
Glucose, Bld: 86 mg/dL (ref 70–99)
Potassium: 3.6 mmol/L (ref 3.5–5.1)
Sodium: 140 mmol/L (ref 135–145)

## 2020-06-23 MED ORDER — ENOXAPARIN SODIUM 40 MG/0.4ML ~~LOC~~ SOLN
40.0000 mg | SUBCUTANEOUS | Status: DC
  Administered 2020-06-24 – 2020-06-26 (×2): 40 mg via SUBCUTANEOUS
  Filled 2020-06-23 (×4): qty 0.4

## 2020-06-23 MED ORDER — SODIUM CHLORIDE 0.9 % IV SOLN: INTRAVENOUS | Status: AC

## 2020-06-23 MED ORDER — DIPHENHYDRAMINE HCL 25 MG PO CAPS
25.0000 mg | ORAL_CAPSULE | Freq: Every evening | ORAL | Status: DC | PRN
  Administered 2020-06-23 – 2020-06-26 (×4): 25 mg via ORAL
  Filled 2020-06-23 (×4): qty 1

## 2020-06-23 MED ORDER — ACETAMINOPHEN 500 MG PO TABS
500.0000 mg | ORAL_TABLET | Freq: Every evening | ORAL | Status: DC | PRN
  Administered 2020-06-23 – 2020-06-26 (×3): 500 mg via ORAL
  Filled 2020-06-23 (×3): qty 1

## 2020-06-23 MED ORDER — CHLORHEXIDINE GLUCONATE CLOTH 2 % EX PADS
6.0000 | MEDICATED_PAD | Freq: Every day | CUTANEOUS | Status: DC
  Administered 2020-06-24: 6 via TOPICAL

## 2020-06-23 NOTE — ED Notes (Signed)
Contacted [place], MD via secure chatabout maintenance fluid order. Maintenance fluids stopped due to order being over prior to this RNs arrival. Pt in NAD at this time. VSS. Awaiting further orders. Will continue to monitor.

## 2020-06-23 NOTE — Progress Notes (Addendum)
PROGRESS NOTE   Tina [name]  VXB:[id number]    DOB: 09/19/1932    DOA: 06/22/2020  PCP: Virginia Crews, MD   I have briefly reviewed patients previous medical records in [facility]  Chief Complaint  Patient presents with  . Fall    SW placement     Brief Narrative:  86 year old female, reportedly lives alone and ambulates with the help of her 4 wheeled walker, past medical history of hypertension, osteoarthritis, s/p right total hip arthroplasty in 3300 complicated by chronic infection for which she is on suppressive doxycycline therapy, polymyalgia rheumatica and chronic kidney disease presented to the ED following a recent fall and failure to thrive.  She reportedly was having hypotension, presyncope, becoming significantly weak and unable to care for herself over the last month.  She was not able to walk for the last several weeks.  She was seen in the ED the day prior but declined admission.  Continue to report reduced appetite, feeling tired and weak.  She was admitted due to acute kidney injury from dehydration, failure to thrive and recent fall.  Improving   Assessment & Plan:  Active Problems:   Failure to thrive (child)   Acute kidney injury complicating stage IIIa chronic kidney disease/dehydration: Creatinine 0.9 on 11/23/2019.  Had creatinine of 1.55 on 11/23.  Likely related to dehydration from poor oral intake.  Resolved after IV fluids.  Encourage oral fluid intake and continue gentle IV fluids overnight.  Follow BMP in a.m.  Adult failure to thrive/recent fall: Multifactorial secondary to multiple severe significant comorbidities: Dehydration, AKI, recent fall complicating advanced age and frail physical status.  Pelvic x-rays negative for acute findings.  PT evaluation.  Essential hypertension: Controlled on metoprolol and lisinopril, continue.  Chronic postop right hip infection: Continue suppressive doxycycline.  Anemia, suspect chronic  disease Stable.  S/p colostomy Noted.  Body mass index is 30.9 kg/m./Obesity    DVT prophylaxis:   Lovenox Code Status: Full. Family Communication: I discussed with her son in detail via phone, updated care and answered questions. Disposition:  Status is: Inpatient  Remains inpatient appropriate because:Inpatient level of care appropriate due to severity of illness   Dispo: The patient is from: Home              Anticipated d/c is to: SNF              Anticipated d/c date is: 1 day              Patient currently is not medically stable to d/c.        Consultants:   None  Procedures:   None  Antimicrobials:    Anti-infectives (From admission, onward)   Start     Dose/Rate Route Frequency Ordered Stop   06/22/20 1315  doxycycline (VIBRA-TABS) tablet 100 mg        100 mg Oral 2 times daily 06/22/20 1306          Subjective:  Patient seen in the ED this morning.  States that she has been unwell for more than a week.  She was on her way to see her PCP with her son when she reportedly slid off the car seat, denies LOC or injuries.  Currently feels better.  Denies complaints.  No pain reported.  Objective:   Vitals:   06/23/20 0400 06/23/20 0855 06/23/20 1226 06/23/20 1513  BP: 123/60 137/81 130/84 125/60  Pulse: 68 74 69 76  Resp: (!) 22  18 19 18   Temp:  97.9 F (36.6 C) (!) 97.5 F (36.4 C) 97.8 F (36.6 C)  TempSrc:  Oral Oral   SpO2: 100% 100% 100% 100%  Weight:      Height:        General exam: Elderly female, moderately built and obese lying comfortably supine in bed without distress.  Oral mucosa dry. Respiratory system: Clear to auscultation. Respiratory effort normal. Cardiovascular system: S1 & S2 heard, RRR. No JVD, murmurs, rubs, gallops or clicks. No pedal edema.  Telemetry personally reviewed: Sinus rhythm.  Some tremor artifacts. Gastrointestinal system: Abdomen is nondistended, soft and nontender. No organomegaly or masses felt. Normal  bowel sounds heard.  LLQ colostomy with liquid green stools.  No blood or mucus noted. Central nervous system: Alert and oriented. No focal neurological deficits. Extremities: Symmetric 5 x 5 power.  Right upper extremity bruises. Skin: No rashes, lesions or ulcers Psychiatry: Judgement and insight somewhat impaired. Mood & affect appropriate.     Data Reviewed:   I have personally reviewed following labs and imaging studies   CBC: Recent Labs  Lab 06/21/20 1732 06/22/20 1308  WBC 7.9 9.0  NEUTROABS  --  6.6  HGB 11.5* 11.2*  HCT 35.4* 34.4*  MCV 96.7 96.6  PLT 290 644    Basic Metabolic Panel: Recent Labs  Lab 06/21/20 1732 06/22/20 1308 06/23/20 0904  NA 138 138 140  K 4.2 4.8 3.6  CL 107 109 109  CO2 21* 17* 21*  GLUCOSE 132* 93 86  BUN 37* 36* 32*  CREATININE 1.55* 1.31* 1.09*  CALCIUM 8.9 8.6* 8.3*  MG  --  2.2  --     Liver Function Tests: Recent Labs  Lab 06/22/20 1308  AST 28  ALT 25  ALKPHOS 102  BILITOT 1.5*  PROT 5.5*  ALBUMIN 2.5*    CBG: No results for input(s): GLUCAP in the last 168 hours.  Microbiology Studies:   Recent Results (from the past 240 hour(s))  Resp Panel by RT-PCR (Flu A&B, Covid) Nasopharyngeal Swab     Status: None   Collection Time: 06/22/20  1:08 PM   Specimen: Nasopharyngeal Swab; Nasopharyngeal(NP) swabs in vial transport medium  Result Value Ref Range Status   SARS Coronavirus 2 by RT PCR NEGATIVE NEGATIVE Final    Comment: (NOTE) SARS-CoV-2 target nucleic acids are NOT DETECTED.  The SARS-CoV-2 RNA is generally detectable in upper respiratory specimens during the acute phase of infection. The lowest concentration of SARS-CoV-2 viral copies this assay can detect is 138 copies/mL. A negative result does not preclude SARS-Cov-2 infection and should not be used as the sole basis for treatment or other patient management decisions. A negative result may occur with  improper specimen collection/handling,  submission of specimen other than nasopharyngeal swab, presence of viral mutation(s) within the areas targeted by this assay, and inadequate number of viral copies(<138 copies/mL). A negative result must be combined with clinical observations, patient history, and epidemiological information. The expected result is Negative.  Fact Sheet for Patients:  EntrepreneurPulse.com.au  Fact Sheet for Healthcare Providers:  IncredibleEmployment.be  This test is no t yet approved or cleared by the [place] FDA and  has been authorized for detection and/or diagnosis of SARS-CoV-2 by FDA under an Emergency Use Authorization (EUA). This EUA will remain  in effect (meaning this test can be used) for the duration of the COVID-19 declaration under Section 564(b)(1) of the Act, 21 U.S.C.section 360bbb-3(b)(1), unless the authorization is terminated  or revoked sooner.       Influenza A by PCR NEGATIVE NEGATIVE Final   Influenza B by PCR NEGATIVE NEGATIVE Final    Comment: (NOTE) The Xpert Xpress SARS-CoV-2/FLU/RSV plus assay is intended as an aid in the diagnosis of influenza from Nasopharyngeal swab specimens and should not be used as a sole basis for treatment. Nasal washings and aspirates are unacceptable for Xpert Xpress SARS-CoV-2/FLU/RSV testing.  Fact Sheet for Patients: EntrepreneurPulse.com.au  Fact Sheet for Healthcare Providers: IncredibleEmployment.be  This test is not yet approved or cleared by the [place] FDA and has been authorized for detection and/or diagnosis of SARS-CoV-2 by FDA under an Emergency Use Authorization (EUA). This EUA will remain in effect (meaning this test can be used) for the duration of the COVID-19 declaration under Section 564(b)(1) of the Act, 21 U.S.C. section 360bbb-3(b)(1), unless the authorization is terminated or revoked.  Performed at [facility], [address], [place], [place] [address]      Radiology Studies:  DG Chest 2 View  Result Date: 06/21/2020 CLINICAL DATA:  Pain status post fall. EXAM: CHEST - 2 VIEW COMPARISON:  October 09, 2015 FINDINGS: The heart size and mediastinal contours are within normal limits. Both lungs are clear. The visualized skeletal structures are unremarkable. Atherosclerotic changes are noted of the thoracic aorta. The thoracic aorta is tortuous. IMPRESSION: No active cardiopulmonary disease. Electronically Signed   By: Constance Holster M.D.   On: 06/21/2020 22:22   DG Pelvis Portable  Result Date: 06/22/2020 CLINICAL DATA:  Fall. EXAM: PORTABLE PELVIS 1-2 VIEWS COMPARISON:  None. FINDINGS: There is no evidence of pelvic fracture or diastasis. No pelvic bone lesions are seen. Status post right total hip arthroplasty. IMPRESSION: Negative. Electronically Signed   By: Marijo Conception M.D.   On: 06/22/2020 13:19     Scheduled Meds:   . doxycycline  100 mg Oral BID  . lisinopril  20 mg Oral Daily  . metoprolol tartrate  50 mg Oral BID  . multivitamin with minerals  1 tablet Oral Daily    Continuous Infusions:     LOS: 1 day     Vernell Leep, MD, [place], [facility]. [facility]    To contact the attending provider between 7A-7P or the covering provider during after hours 7P-7A, please log into the web site [email or web address] and access using universal [place] password for that web site. If you do not have the password, please call the hospital operator.  06/23/2020, 4:35 PM

## 2020-06-23 NOTE — ED Notes (Signed)
Took over care of pt. Pt sleeping and appears to be in NAD at this time. VSS. Awaiting further orders. Will continue to monitor.

## 2020-06-23 NOTE — Consult Note (Signed)
ANTICOAGULATION CONSULT NOTE  Pharmacy Consult for Lovenox Indication: VTE prophylaxis  Patient Measurements: Height: 5\' 4"  (162.6 cm) Weight: 81.6 kg (180 lb) IBW/kg (Calculated) : 54.7  Labs: Recent Labs    06/21/20 1732 06/22/20 1308 06/23/20 0904  HGB 11.5* 11.2*  --   HCT 35.4* 34.4*  --   PLT 290 289  --   CREATININE 1.55* 1.31* 1.09*  CKTOTAL  --  95  --   TROPONINIHS 15  --   --     Estimated Creatinine Clearance: 39 mL/min (A) (by C-G formula based on SCr of 1.09 mg/dL (H)).   Medications:  No anticoagulation prior to admission per my chart review  Assessment: Patient is an 86 y/o F with medical history including HTN, OA s/p R THA in 8676 complicated by chronic infection on suppressive therapy with doxycycline, CKD, polymyalgia rheumatica who presented to the ED 11/24 with weakness / recent fall. Subsequently admitted for AKI on CKD and failure to thrive. Pharmacy has been consulted for Lovenox dosing for VTE prophylaxis.    Plan:  --Start enoxaparin 40 mg q24h  --Scr at least weekly per protocol  Benita Gutter 06/23/2020,5:32 PM

## 2020-06-23 NOTE — ED Notes (Signed)
ED Provider at bedside. 

## 2020-06-23 NOTE — ED Notes (Signed)
Removed pt's BP cuff pt reports feeling uncomfortable

## 2020-06-23 NOTE — Care Plan (Addendum)
Pt to unit at this time via stretcher, report from [place], max assist to move to bed. Pt is AxOx4, hard of hearing, hearing aids present. Pt skin noted to have Stage II pressure sore to mid buttocks at intergluteal cleft. Area cleansed and sacrum foam placed, wound care consult placed. Non blanchable, red, painful to touch. Skin is dry, flaky. Pt is very sensitive to touch at lower extremities, more to right hip/leg. Noted swelling generalized more so to BLE +2. Pt states she has hip replacement many years ago. Pt wears glasses. Noted belongings: ring, watch, life alert necklace, jacket, pajamas and shoes. Pt states son is aware she is here. Pt IV blown and leaking at site. IV team consulted. Peri care provided to foley cath at this time. Pt has noted redness to vagina. Pt states she has had multiple falls. Denies pain. Bruising noted to bilateral arms/hands. Cleansed best to ability as patient did not want to be touch, educated about proper hygiene at this time and allowed writer to complete assessment and care. Educated patient to the unit and how to call for help. Set up patients dinner at this time. Bed alarm on, will continue to monitor closely.

## 2020-06-24 DIAGNOSIS — N179 Acute kidney failure, unspecified: Secondary | ICD-10-CM | POA: Diagnosis not present

## 2020-06-24 DIAGNOSIS — L8990 Pressure ulcer of unspecified site, unspecified stage: Secondary | ICD-10-CM | POA: Insufficient documentation

## 2020-06-24 LAB — BASIC METABOLIC PANEL
Anion gap: 9 (ref 5–15)
BUN: 32 mg/dL — ABNORMAL HIGH (ref 8–23)
CO2: 22 mmol/L (ref 22–32)
Calcium: 7.7 mg/dL — ABNORMAL LOW (ref 8.9–10.3)
Chloride: 110 mmol/L (ref 98–111)
Creatinine, Ser: 0.95 mg/dL (ref 0.44–1.00)
GFR, Estimated: 59 mL/min — ABNORMAL LOW (ref >60)
Glucose, Bld: 89 mg/dL (ref 70–99)
Potassium: 3.5 mmol/L (ref 3.5–5.1)
Sodium: 141 mmol/L (ref 135–145)

## 2020-06-24 NOTE — Consult Note (Signed)
WOC Nurse Consult Note: Reason for Consult: Stage 2 Pressure injury Wound type: Stage 2 Pressure injury Pressure Injury POA: Yes Measurement:4cm x 1cm x 0.1cm  Wound bed: pink, clean, moist Drainage (amount, consistency, odor) none per nursing documentation Periwound:inatct Dressing procedure/placement/frequency: Continue silicone foam as implemented appropriately with the skin care order set. Change every 3 days, lift q shift to inspect for any acute changes.  Discussed POC with patient and bedside nurse.  Re consult if needed, will not follow at this time. Thanks  Catricia Scheerer [employer], RN,CWOCN, CNS, [place] [phone])

## 2020-06-24 NOTE — TOC Progression Note (Addendum)
Transition of Care [facility]) - Progression Note    Patient Details  Name: Tina [name] MRN: [id number] Date of Birth: [birth year]  Transition of Care [facility]) CM/SW [place], LCSW Phone Number: 06/24/2020, 12:19 PM  Clinical Narrative: CSW met with patient to provide bed offers. Notified her that [facility] does not have any beds and [facility] only takes patients that live in their ALF or ILF. [facility] has offered a bed but would not have anything open until Monday. [facility], [facility], and [facility] are still pending. She will review options with her son when he arrives this afternoon and call CSW with update. Patient stated she has received her COVID vaccines.  3:52 pm: CSW met with patient. She had a friend's wife go to [facility] in the past and would like to see if they would be able to offer a bed. Left message for admissions coordinator asking her to review referral.  4:05 pm: [facility] is able to offer a bed. They should have availability over the weekend but may only have semiprivate rooms. Patient has been notified. CSW asked her to discuss with her son tonight. Patient will have her third midnight under inpatient status tonight.  Expected Discharge Plan: [place] Barriers to Discharge: Continued Medical Work up, Other (comment) (ED SNF placement)  Expected Discharge Plan and Services Expected Discharge Plan: [place] In-house Referral: Clinical Social Work Discharge Planning Services: CM Consult Post Acute Care Choice: [place] arrangements for the past 2 months: Single Family Home                 DME Arranged: N/A DME Agency: NA       HH Arranged: NA HH Agency: NA         Social Determinants of Health (SDOH) Interventions    Readmission Risk Interventions No flowsheet data found.

## 2020-06-24 NOTE — Treatment Plan (Signed)
Patient refused noon vitals, educated patient in length about importance of proper assessment in acute setting and patient still refused.

## 2020-06-24 NOTE — Care Management Important Message (Signed)
Important Message  Patient Details  Name: ACHSAH MCQUADE MRN: [id number] Date of Birth: June 30, 1933   Medicare Important Message Given:  N/A - LOS <3 / Initial given by admissions     Dannette Barbara 06/24/2020, 9:00 AM

## 2020-06-24 NOTE — Plan of Care (Signed)
Had a lengthy discussion on code status with pt. Pt states that if it is Gods will, she wants to go to heaven. She does not want to be resuscitated, nor be in pain. Educated the difference between DNR and Full code. She states she understood by repeating Gods will and stating she doesn't want her to son to have to make that choice at the time. MD Hongalgi made aware.

## 2020-06-24 NOTE — Progress Notes (Signed)
PROGRESS NOTE   Tina [name]  TJQ:[id number]    DOB: 1933/01/02    DOA: 06/22/2020  PCP: Virginia Crews, MD   I have briefly reviewed patients previous medical records in [facility].  Chief Complaint  Patient presents with  . Fall    SW placement     Brief Narrative:  86 year old female, reportedly lives alone and ambulates with the help of her 4 wheeled walker, past medical history of hypertension, osteoarthritis, s/p right total hip arthroplasty in 7622 complicated by chronic infection for which she is on suppressive doxycycline therapy, polymyalgia rheumatica and chronic kidney disease presented to the ED following a recent fall and failure to thrive.  She reportedly was having hypotension, presyncope, becoming significantly weak and unable to care for herself over the last month.  She was not able to walk for the last several weeks.  She was seen in the ED the day prior but declined admission.  Continue to report reduced appetite, feeling tired and weak.  She was admitted due to acute kidney injury from dehydration, failure to thrive and recent fall.  Stable for DC pending SNF bed.   Assessment & Plan:  Active Problems:   Failure to thrive (child)   Pressure injury of skin   Acute kidney injury complicating stage IIIa chronic kidney disease/dehydration: Creatinine 0.9 on 11/23/2019.  Had creatinine of 1.55 on 11/23.  Likely related to dehydration from poor oral intake.  Resolved after IV fluids.  Encourage oral fluid intake and continue gentle IV fluids overnight.  Resolved and stable.  Adult failure to thrive/recent fall: Multifactorial secondary to multiple severe significant comorbidities: Dehydration, AKI, recent fall complicating advanced age and frail physical status.  Pelvic x-rays negative for acute findings.  PT evaluation-it does not appear that PT has seen the patient yet, reordered.  Essential hypertension: Controlled on metoprolol and lisinopril,  continue.  Chronic postop right hip infection: Continue suppressive doxycycline.  Anemia, suspect chronic disease Stable.  S/p colostomy Noted.  Body mass index is 29.29 kg/m./Obesity  Pressure Injury 06/23/20 Sacrum Stage 2 -  Partial thickness loss of dermis presenting as a shallow open injury with a red, pink wound bed without slough. Stage 11 Pressure sore to mid sacrum (Active)  06/23/20 1808  Location: Sacrum  Location Orientation:   Staging: Stage 2 -  Partial thickness loss of dermis presenting as a shallow open injury with a red, pink wound bed without slough.  Wound Description (Comments): Stage 11 Pressure sore to mid sacrum  Present on Admission:       DVT prophylaxis: enoxaparin (LOVENOX) injection 40 mg Start: 06/23/20 2200   Code Status: DNR.  This was confirmed by patient's RN extensively discussed with patient today-kindly see detailed progress note. Family Communication: I discussed with her son in detail via phone on 11/25, updated care and answered questions. Disposition:  Status is: Inpatient  Remains inpatient appropriate because:Inpatient level of care appropriate due to severity of illness   Dispo: The patient is from: Home              Anticipated d/c is to: SNF              Anticipated d/c date is: Medically stable for DC pending SNF bed              Patient currently is medically stable for DC.  TOC aware        Consultants:   None  Procedures:   None  Antimicrobials:    Anti-infectives (From admission, onward)   Start     Dose/Rate Route Frequency Ordered Stop   06/22/20 1315  doxycycline (VIBRA-TABS) tablet 100 mg        100 mg Oral 2 times daily 06/22/20 1306          Subjective:  Patient is hard of hearing.  Irritable.  States that she only came to the hospital so that she could go to rehab and complaints about the "unnecessary" tests and evaluation being done.  She states that she does not want anything else done at this  time other than going to rehab.  Denies complaints.  Appears to have chronic pains at multiple places.  Objective:   Vitals:   06/23/20 1900 06/24/20 0511 06/24/20 0805 06/24/20 1550  BP:  (!) 146/55 (!) 145/68 (!) 147/70  Pulse:  74 80 78  Resp:  20 16 17   Temp:  (!) 97.5 F (36.4 C) 97.9 F (36.6 C) 98.1 F (36.7 C)  TempSrc:  Oral Oral   SpO2:  98% 95% 97%  Weight: 77.4 kg     Height:        General exam: Elderly female, moderately built and obese lying comfortably supine in bed without distress.  Oral mucosa moist. Respiratory system: Clear to auscultation. Respiratory effort normal. Cardiovascular system: S1 & S2 heard, RRR. No JVD, murmurs, rubs, gallops or clicks. No pedal edema.  Stable and not on telemetry. Gastrointestinal system: Abdomen is nondistended, soft and nontender. No organomegaly or masses felt. Normal bowel sounds heard.  LLQ colostomy with liquid green stools.  No blood or mucus noted. Central nervous system: Alert and oriented. No focal neurological deficits.  Hard of hearing and her hearing aids had run out of batteries Extremities: Symmetric 5 x 5 power.  Right upper extremity bruises. Skin: No rashes, lesions or ulcers Psychiatry: Judgement and insight intact. Mood & affect appropriate.     Data Reviewed:   I have personally reviewed following labs and imaging studies   CBC: Recent Labs  Lab 06/21/20 1732 06/22/20 1308  WBC 7.9 9.0  NEUTROABS  --  6.6  HGB 11.5* 11.2*  HCT 35.4* 34.4*  MCV 96.7 96.6  PLT 290 161    Basic Metabolic Panel: Recent Labs  Lab 06/22/20 1308 06/23/20 0904 06/24/20 0620  NA 138 140 141  K 4.8 3.6 3.5  CL 109 109 110  CO2 17* 21* 22  GLUCOSE 93 86 89  BUN 36* 32* 32*  CREATININE 1.31* 1.09* 0.95  CALCIUM 8.6* 8.3* 7.7*  MG 2.2  --   --     Liver Function Tests: Recent Labs  Lab 06/22/20 1308  AST 28  ALT 25  ALKPHOS 102  BILITOT 1.5*  PROT 5.5*  ALBUMIN 2.5*    CBG: No results for  input(s): GLUCAP in the last 168 hours.  Microbiology Studies:   Recent Results (from the past 240 hour(s))  Resp Panel by RT-PCR (Flu A&B, Covid) Nasopharyngeal Swab     Status: None   Collection Time: 06/22/20  1:08 PM   Specimen: Nasopharyngeal Swab; Nasopharyngeal(NP) swabs in vial transport medium  Result Value Ref Range Status   SARS Coronavirus 2 by RT PCR NEGATIVE NEGATIVE Final    Comment: (NOTE) SARS-CoV-2 target nucleic acids are NOT DETECTED.  The SARS-CoV-2 RNA is generally detectable in upper respiratory specimens during the acute phase of infection. The lowest concentration of SARS-CoV-2 viral copies this assay can detect is 138  copies/mL. A negative result does not preclude SARS-Cov-2 infection and should not be used as the sole basis for treatment or other patient management decisions. A negative result may occur with  improper specimen collection/handling, submission of specimen other than nasopharyngeal swab, presence of viral mutation(s) within the areas targeted by this assay, and inadequate number of viral copies(<138 copies/mL). A negative result must be combined with clinical observations, patient history, and epidemiological information. The expected result is Negative.  Fact Sheet for Patients:  EntrepreneurPulse.com.au  Fact Sheet for Healthcare Providers:  IncredibleEmployment.be  This test is no t yet approved or cleared by the [place] FDA and  has been authorized for detection and/or diagnosis of SARS-CoV-2 by FDA under an Emergency Use Authorization (EUA). This EUA will remain  in effect (meaning this test can be used) for the duration of the COVID-19 declaration under Section 564(b)(1) of the Act, 21 U.S.C.section 360bbb-3(b)(1), unless the authorization is terminated  or revoked sooner.       Influenza A by PCR NEGATIVE NEGATIVE Final   Influenza B by PCR NEGATIVE NEGATIVE Final    Comment:  (NOTE) The Xpert Xpress SARS-CoV-2/FLU/RSV plus assay is intended as an aid in the diagnosis of influenza from Nasopharyngeal swab specimens and should not be used as a sole basis for treatment. Nasal washings and aspirates are unacceptable for Xpert Xpress SARS-CoV-2/FLU/RSV testing.  Fact Sheet for Patients: EntrepreneurPulse.com.au  Fact Sheet for Healthcare Providers: IncredibleEmployment.be  This test is not yet approved or cleared by the [place] FDA and has been authorized for detection and/or diagnosis of SARS-CoV-2 by FDA under an Emergency Use Authorization (EUA). This EUA will remain in effect (meaning this test can be used) for the duration of the COVID-19 declaration under Section 564(b)(1) of the Act, 21 U.S.C. section 360bbb-3(b)(1), unless the authorization is terminated or revoked.  Performed at [facility], [address], [place], [place] [address]      Radiology Studies:  No results found.   Scheduled Meds:   . Chlorhexidine Gluconate Cloth  6 each Topical Daily  . doxycycline  100 mg Oral BID  . enoxaparin (LOVENOX) injection  40 mg Subcutaneous Q24H  . lisinopril  20 mg Oral Daily  . metoprolol tartrate  50 mg Oral BID  . multivitamin with minerals  1 tablet Oral Daily    Continuous Infusions:     LOS: 2 days     Vernell Leep, MD, [place], [facility]. [facility]    To contact the attending provider between 7A-7P or the covering provider during after hours 7P-7A, please log into the web site [email or web address] and access using universal [place] password for that web site. If you do not have the password, please call the hospital operator.  06/24/2020, 5:19 PM

## 2020-06-25 DIAGNOSIS — R338 Other retention of urine: Secondary | ICD-10-CM

## 2020-06-25 MED ORDER — LORAZEPAM 0.5 MG PO TABS
0.5000 mg | ORAL_TABLET | Freq: Once | ORAL | Status: DC
  Filled 2020-06-25: qty 1

## 2020-06-25 NOTE — Treatment Plan (Signed)
Pt very tearful and states "I am just pissed off" because staff has been checking her BP q4h. Pt educated that she can refuse them and then states "well that just makes me look bad." Asked patient what will make her happy, offered dose of ativan. She refused. She states "nothing". Asked patient if she wants to get up to bedside commode to urinate since she has a purewick and she states "no I cannot walk". Patient was able to move her legs in bed without hesitation. Pt reassured that she is getting good care here at the hospital and that there was nothing unnecessary being done other than trying to give her great care. Pt states again that she was "pissed off" and wants to be left alone. Offered patient : water, soda, ice cream, mobility. She refused again. Bladder scan completed at this time, MD made aware 360ml. Bed alarm on.

## 2020-06-25 NOTE — Progress Notes (Addendum)
PROGRESS NOTE   Tina [name]  MVH:[id number]    DOB: 09-19-1932    DOA: 06/22/2020  PCP: Virginia Crews, MD   I have briefly reviewed patients previous medical records in [facility].  Chief Complaint  Patient presents with  . Fall    SW placement     Brief Narrative:  86 year old female, reportedly lives alone and ambulates with the help of her 4 wheeled walker, past medical history of hypertension, osteoarthritis, s/p right total hip arthroplasty in 8413 complicated by chronic infection for which she is on suppressive doxycycline therapy, polymyalgia rheumatica and chronic kidney disease presented to the ED following a recent fall and failure to thrive.  She reportedly was having hypotension, presyncope, becoming significantly weak and unable to care for herself over the last month.  She was not able to walk for the last several weeks.  She was seen in the ED the day prior but declined admission.  Continue to report reduced appetite, feeling tired and weak.  She was admitted due to acute kidney injury from dehydration, failure to thrive and recent fall.  Stable for DC pending SNF bed.   Assessment & Plan:  Active Problems:   Failure to thrive (child)   Pressure injury of skin   Acute kidney injury complicating stage IIIa chronic kidney disease/dehydration: Creatinine 0.9 on 11/23/2019.  Had creatinine of 1.55 on 11/23.  Likely related to dehydration from poor oral intake.  Resolved after IV fluids.  Encouraged oral fluid intake.  Adult failure to thrive/recent fall: Multifactorial secondary to multiple severe significant comorbidities: Dehydration, AKI, recent fall complicating advanced age and frail physical status.  Pelvic x-rays negative for acute findings.  PT evaluation-it does not appear that PT has seen the patient yet, reordered.  Essential hypertension: Controlled on metoprolol and lisinopril, continue.  Chronic postop right hip infection: Continue  suppressive doxycycline.  Anemia, suspect chronic disease Stable.  S/p colostomy Noted.  Acute urinary retention Reportedly had Foley catheter placed in the ED for volume of 340 mL.  Discontinue Foley catheter.  Monitor for voiding.  Body mass index is 29.29 kg/m./Obesity  Pressure Injury 06/23/20 Sacrum Stage 2 -  Partial thickness loss of dermis presenting as a shallow open injury with a red, pink wound bed without slough. Stage 11 Pressure sore to mid sacrum (Active)  06/23/20 1808  Location: Sacrum  Location Orientation:   Staging: Stage 2 -  Partial thickness loss of dermis presenting as a shallow open injury with a red, pink wound bed without slough.  Wound Description (Comments): Stage 11 Pressure sore to mid sacrum  Present on Admission:       DVT prophylaxis: enoxaparin (LOVENOX) injection 40 mg Start: 06/23/20 2200   Code Status: DNR.   Family Communication: I discussed with her son in detail via phone on 11/27, updated care and answered questions.  Updated him that patient has changed her CODE STATUS to DNR which he understands and agrees. Disposition:  Status is: Inpatient  Remains inpatient appropriate because:Inpatient level of care appropriate due to severity of illness   Dispo: The patient is from: Home              Anticipated d/c is to: SNF              Anticipated d/c date is: Medically stable for DC pending SNF bed              Patient currently is medically stable for DC.  TOC  aware        Consultants:   None  Procedures:   None  Antimicrobials:    Anti-infectives (From admission, onward)   Start     Dose/Rate Route Frequency Ordered Stop   06/22/20 1315  doxycycline (VIBRA-TABS) tablet 100 mg        100 mg Oral 2 times daily 06/22/20 1306          Subjective:  Patient interviewed and examined along with RN.  Patient has no complaints.  States that she is just waiting to get to the rehab facility.  As per RN, patient has expressed  multiple times her frustration of remaining in the hospital, added expenses to her due to this while she can be at the rehab.  Objective:   Vitals:   06/24/20 1550 06/24/20 2119 06/25/20 0907 06/25/20 1226  BP: (!) 147/70  (!) 125/52 136/72  Pulse: 78 80 83 77  Resp: 17  20 18   Temp: 98.1 F (36.7 C)  97.9 F (36.6 C) 98.6 F (37 C)  TempSrc:   Oral Oral  SpO2: 97%  98% 99%  Weight:      Height:        General exam: Elderly female, moderately built and obese lying comfortably supine in bed without distress.  Oral mucosa moist. Respiratory system: Clear to auscultation. Respiratory effort normal. Cardiovascular system: S1 & S2 heard, RRR. No JVD, murmurs, rubs, gallops or clicks. No pedal edema.  Stable and not on telemetry. Gastrointestinal system: Abdomen is nondistended, soft and nontender. No organomegaly or masses felt. Normal bowel sounds heard.  LLQ colostomy with liquid green stools.  No blood or mucus noted. Central nervous system: Alert and oriented. No focal neurological deficits.  Hard of hearing and her hearing aids had run out of batteries Extremities: Symmetric 5 x 5 power.  Right upper extremity bruises. Skin: No rashes, lesions or ulcers Psychiatry: Judgement and insight intact. Mood & affect appropriate.     Data Reviewed:   I have personally reviewed following labs and imaging studies   CBC: Recent Labs  Lab 06/21/20 1732 06/22/20 1308  WBC 7.9 9.0  NEUTROABS  --  6.6  HGB 11.5* 11.2*  HCT 35.4* 34.4*  MCV 96.7 96.6  PLT 290 370    Basic Metabolic Panel: Recent Labs  Lab 06/22/20 1308 06/23/20 0904 06/24/20 0620  NA 138 140 141  K 4.8 3.6 3.5  CL 109 109 110  CO2 17* 21* 22  GLUCOSE 93 86 89  BUN 36* 32* 32*  CREATININE 1.31* 1.09* 0.95  CALCIUM 8.6* 8.3* 7.7*  MG 2.2  --   --     Liver Function Tests: Recent Labs  Lab 06/22/20 1308  AST 28  ALT 25  ALKPHOS 102  BILITOT 1.5*  PROT 5.5*  ALBUMIN 2.5*    CBG: No results for  input(s): GLUCAP in the last 168 hours.  Microbiology Studies:   Recent Results (from the past 240 hour(s))  Resp Panel by RT-PCR (Flu A&B, Covid) Nasopharyngeal Swab     Status: None   Collection Time: 06/22/20  1:08 PM   Specimen: Nasopharyngeal Swab; Nasopharyngeal(NP) swabs in vial transport medium  Result Value Ref Range Status   SARS Coronavirus 2 by RT PCR NEGATIVE NEGATIVE Final    Comment: (NOTE) SARS-CoV-2 target nucleic acids are NOT DETECTED.  The SARS-CoV-2 RNA is generally detectable in upper respiratory specimens during the acute phase of infection. The lowest concentration of SARS-CoV-2 viral copies  this assay can detect is 138 copies/mL. A negative result does not preclude SARS-Cov-2 infection and should not be used as the sole basis for treatment or other patient management decisions. A negative result may occur with  improper specimen collection/handling, submission of specimen other than nasopharyngeal swab, presence of viral mutation(s) within the areas targeted by this assay, and inadequate number of viral copies(<138 copies/mL). A negative result must be combined with clinical observations, patient history, and epidemiological information. The expected result is Negative.  Fact Sheet for Patients:  EntrepreneurPulse.com.au  Fact Sheet for Healthcare Providers:  IncredibleEmployment.be  This test is no t yet approved or cleared by the [place] FDA and  has been authorized for detection and/or diagnosis of SARS-CoV-2 by FDA under an Emergency Use Authorization (EUA). This EUA will remain  in effect (meaning this test can be used) for the duration of the COVID-19 declaration under Section 564(b)(1) of the Act, 21 U.S.C.section 360bbb-3(b)(1), unless the authorization is terminated  or revoked sooner.       Influenza A by PCR NEGATIVE NEGATIVE Final   Influenza B by PCR NEGATIVE NEGATIVE Final    Comment:  (NOTE) The Xpert Xpress SARS-CoV-2/FLU/RSV plus assay is intended as an aid in the diagnosis of influenza from Nasopharyngeal swab specimens and should not be used as a sole basis for treatment. Nasal washings and aspirates are unacceptable for Xpert Xpress SARS-CoV-2/FLU/RSV testing.  Fact Sheet for Patients: EntrepreneurPulse.com.au  Fact Sheet for Healthcare Providers: IncredibleEmployment.be  This test is not yet approved or cleared by the [place] FDA and has been authorized for detection and/or diagnosis of SARS-CoV-2 by FDA under an Emergency Use Authorization (EUA). This EUA will remain in effect (meaning this test can be used) for the duration of the COVID-19 declaration under Section 564(b)(1) of the Act, 21 U.S.C. section 360bbb-3(b)(1), unless the authorization is terminated or revoked.  Performed at [facility], [address], [place], [place] [address]      Radiology Studies:  No results found.   Scheduled Meds:   . doxycycline  100 mg Oral BID  . enoxaparin (LOVENOX) injection  40 mg Subcutaneous Q24H  . lisinopril  20 mg Oral Daily  . metoprolol tartrate  50 mg Oral BID  . multivitamin with minerals  1 tablet Oral Daily    Continuous Infusions:     LOS: 3 days     Vernell Leep, MD, [place], [facility]. [facility]    To contact the attending provider between 7A-7P or the covering provider during after hours 7P-7A, please log into the web site [email or web address] and access using universal [place] password for that web site. If you do not have the password, please call the hospital operator.  06/25/2020, 1:52 PM

## 2020-06-26 ENCOUNTER — Inpatient Hospital Stay: Payer: Medicare Other

## 2020-06-26 DIAGNOSIS — R338 Other retention of urine: Secondary | ICD-10-CM | POA: Diagnosis not present

## 2020-06-26 MED ORDER — ONDANSETRON HCL 4 MG/2ML IJ SOLN
4.0000 mg | Freq: Four times a day (QID) | INTRAMUSCULAR | Status: DC | PRN
  Administered 2020-06-27: 4 mg via INTRAVENOUS
  Filled 2020-06-26: qty 2

## 2020-06-26 MED ORDER — ACETAMINOPHEN 325 MG PO TABS: 650.0000 mg | ORAL_TABLET | Freq: Four times a day (QID) | ORAL | Status: DC | PRN

## 2020-06-26 NOTE — Plan of Care (Signed)
Continuing with plan of care. 

## 2020-06-26 NOTE — Progress Notes (Signed)
Lab called with critical calcium level of 6.4. Provider notified.

## 2020-06-26 NOTE — Progress Notes (Addendum)
PROGRESS NOTE   Tina [name]  OEV:[id number]    DOB: [birth year]    DOA: 06/22/2020  PCP: Virginia Crews, MD   I have briefly reviewed patients previous medical records in [facility].  Chief Complaint  Patient presents with  . Fall    SW placement     Brief Narrative:  86 year old female, reportedly lives alone and ambulates with the help of her 4 wheeled walker, past medical history of hypertension, osteoarthritis, s/p right total hip arthroplasty in 8299 complicated by chronic infection for which she is on suppressive doxycycline therapy, polymyalgia rheumatica and chronic kidney disease presented to the ED following a recent fall and failure to thrive.  She reportedly was having hypotension, presyncope, becoming significantly weak and unable to care for herself over the last month.  She was not able to walk for the last several weeks.  She was seen in the ED the day prior but declined admission.  Continue to report reduced appetite, feeling tired and weak.  She was admitted due to acute kidney injury from dehydration, failure to thrive and recent fall.  Stable for DC pending SNF bed.   Assessment & Plan:  Active Problems:   Failure to thrive (child)   Pressure injury of skin   Acute kidney injury complicating stage IIIa chronic kidney disease/dehydration: Creatinine 0.9 on 11/23/2019.  Had creatinine of 1.55 on 11/23.  Likely related to dehydration from poor oral intake.  Resolved after IV fluids.  Encouraged oral fluid intake.  Periodically follow BMP at SNF.  Adult failure to thrive/recent fall: Multifactorial secondary to multiple severe significant comorbidities: Dehydration, AKI, recent fall complicating advanced age and frail physical status.  Pelvic x-rays negative for acute findings.  Therapist did evaluate patient on day of admission, likely in the ED.  SNF recommended.  Essential hypertension: Controlled on metoprolol and lisinopril, continue.  Chronic  postop right hip infection: Continue suppressive doxycycline.  Anemia, suspect chronic disease Stable.  S/p colostomy Noted.  Acute urinary retention Reportedly had Foley catheter placed in the ED for volume of 340 mL.  Discontinued Foley catheter 11/27.  Has been voiding without difficulty.  Right elbow pain Improving compared to last night.  X-ray without acute bony abnormality.  Monitor  Body mass index is 29.29 kg/m./Obesity  Pressure Injury 06/23/20 Sacrum Stage 2 -  Partial thickness loss of dermis presenting as a shallow open injury with a red, pink wound bed without slough. Stage 11 Pressure sore to mid sacrum (Active)  06/23/20 1808  Location: Sacrum  Location Orientation:   Staging: Stage 2 -  Partial thickness loss of dermis presenting as a shallow open injury with a red, pink wound bed without slough.  Wound Description (Comments): Stage 11 Pressure sore to mid sacrum  Present on Admission:       DVT prophylaxis: enoxaparin (LOVENOX) injection 40 mg Start: 06/23/20 2200   Code Status: DNR.   Family Communication: I discussed with her son in detail via phone on 11/28, updated care and answered questions.  Updated him that patient has changed her CODE STATUS to DNR which he understands and agrees. Disposition:  Status is: Inpatient  Remains inpatient appropriate because:Inpatient level of care appropriate due to severity of illness   Dispo: The patient is from: Home              Anticipated d/c is to: SNF              Anticipated d/c date is: Medically  stable for DC pending SNF bed.  As per [facility] team, may have [facility] tomorrow [facility].              Patient currently is medically stable for DC.  TOC aware        Consultants:   None  Procedures:   None  Antimicrobials:    Anti-infectives (From admission, onward)   Start     Dose/Rate Route Frequency Ordered Stop   06/22/20 1315  doxycycline (VIBRA-TABS) tablet 100 mg        100 mg Oral 2 times  daily 06/22/20 1306          Subjective:  Overnight events noted, pain in her right arm, treated with Tylenol.  This morning actually states that she has pain to the bone in the right elbow but better compared to last night.  Denies any other complaints.  Objective:   Vitals:   06/25/20 0907 06/25/20 1226 06/25/20 1558 06/25/20 2139  BP: (!) 125/52 136/72 (!) 154/82   Pulse: 83 77 83 80  Resp: 20 18 18    Temp: 97.9 F (36.6 C) 98.6 F (37 C) (!) 97.3 F (36.3 C)   TempSrc: Oral Oral Oral   SpO2: 98% 99% 96%   Weight:      Height:        General exam: Elderly female, moderately built and obese lying comfortably supine in bed without distress.  Oral mucosa moist. Respiratory system: Clear to auscultation. Respiratory effort normal. Cardiovascular system: S1 & S2 heard, RRR. No JVD, murmurs, rubs, gallops or clicks. No pedal edema.  Stable and not on telemetry. Gastrointestinal system: Abdomen is nondistended, soft and nontender. No organomegaly or masses felt. Normal bowel sounds heard.  LLQ colostomy with liquid green stools.  No blood or mucus noted. Central nervous system: Alert and oriented. No focal neurological deficits.  Hard of hearing and her hearing aids had run out of batteries Extremities: Symmetric 5 x 5 power.  Right upper extremity bruises, mild soft tissue swelling around the elbow.  Mild bony tenderness at elbow but no deformity appreciated.  Able to flex and extend right elbow with some pain. Skin: No rashes, lesions or ulcers Psychiatry: Judgement and insight intact. Mood & affect appropriate.     Data Reviewed:   I have personally reviewed following labs and imaging studies   CBC: Recent Labs  Lab 06/21/20 1732 06/22/20 1308  WBC 7.9 9.0  NEUTROABS  --  6.6  HGB 11.5* 11.2*  HCT 35.4* 34.4*  MCV 96.7 96.6  PLT 290 657    Basic Metabolic Panel: Recent Labs  Lab 06/22/20 1308 06/23/20 0904 06/24/20 0620  NA 138 140 141  K 4.8 3.6 3.5  CL  109 109 110  CO2 17* 21* 22  GLUCOSE 93 86 89  BUN 36* 32* 32*  CREATININE 1.31* 1.09* 0.95  CALCIUM 8.6* 8.3* 7.7*  MG 2.2  --   --     Liver Function Tests: Recent Labs  Lab 06/22/20 1308  AST 28  ALT 25  ALKPHOS 102  BILITOT 1.5*  PROT 5.5*  ALBUMIN 2.5*    CBG: No results for input(s): GLUCAP in the last 168 hours.  Microbiology Studies:   Recent Results (from the past 240 hour(s))  Resp Panel by RT-PCR (Flu A&B, Covid) Nasopharyngeal Swab     Status: None   Collection Time: 06/22/20  1:08 PM   Specimen: Nasopharyngeal Swab; Nasopharyngeal(NP) swabs in vial transport medium  Result Value Ref Range Status   SARS Coronavirus 2 by RT PCR NEGATIVE NEGATIVE Final    Comment: (NOTE) SARS-CoV-2 target nucleic acids are NOT DETECTED.  The SARS-CoV-2 RNA is generally detectable in upper respiratory specimens during the acute phase of infection. The lowest concentration of SARS-CoV-2 viral copies this assay can detect is 138 copies/mL. A negative result does not preclude SARS-Cov-2 infection and should not be used as the sole basis for treatment or other patient management decisions. A negative result may occur with  improper specimen collection/handling, submission of specimen other than nasopharyngeal swab, presence of viral mutation(s) within the areas targeted by this assay, and inadequate number of viral copies(<138 copies/mL). A negative result must be combined with clinical observations, patient history, and epidemiological information. The expected result is Negative.  Fact Sheet for Patients:  EntrepreneurPulse.com.au  Fact Sheet for Healthcare Providers:  IncredibleEmployment.be  This test is no t yet approved or cleared by the [place] FDA and  has been authorized for detection and/or diagnosis of SARS-CoV-2 by FDA under an Emergency Use Authorization (EUA). This EUA will remain  in effect (meaning this test can be  used) for the duration of the COVID-19 declaration under Section 564(b)(1) of the Act, 21 U.S.C.section 360bbb-3(b)(1), unless the authorization is terminated  or revoked sooner.       Influenza A by PCR NEGATIVE NEGATIVE Final   Influenza B by PCR NEGATIVE NEGATIVE Final    Comment: (NOTE) The Xpert Xpress SARS-CoV-2/FLU/RSV plus assay is intended as an aid in the diagnosis of influenza from Nasopharyngeal swab specimens and should not be used as a sole basis for treatment. Nasal washings and aspirates are unacceptable for Xpert Xpress SARS-CoV-2/FLU/RSV testing.  Fact Sheet for Patients: EntrepreneurPulse.com.au  Fact Sheet for Healthcare Providers: IncredibleEmployment.be  This test is not yet approved or cleared by the [place] FDA and has been authorized for detection and/or diagnosis of SARS-CoV-2 by FDA under an Emergency Use Authorization (EUA). This EUA will remain in effect (meaning this test can be used) for the duration of the COVID-19 declaration under Section 564(b)(1) of the Act, 21 U.S.C. section 360bbb-3(b)(1), unless the authorization is terminated or revoked.  Performed at [facility], [address] [place], [place] [address]      Radiology Studies:  DG ELBOW COMPLETE RIGHT (3+VIEW)  Result Date: 06/26/2020 CLINICAL DATA:  Elbow pain EXAM: RIGHT ELBOW - COMPLETE 3+ VIEW COMPARISON:  None. FINDINGS: There is no evidence of fracture, dislocation, or joint effusion. There is no evidence of arthropathy or other focal bone abnormality. Diffuse soft tissue edema. IMPRESSION: 1. No acute bone abnormality. 2. Soft tissue edema. Electronically Signed   By: Kerby Moors M.D.   On: 06/26/2020 10:47     Scheduled Meds:   . doxycycline  100 mg Oral BID  . enoxaparin (LOVENOX) injection  40 mg Subcutaneous Q24H  . lisinopril  20 mg Oral Daily  . LORazepam  0.5 mg Oral Once  . metoprolol tartrate  50 mg Oral  BID  . multivitamin with minerals  1 tablet Oral Daily    Continuous Infusions:     LOS: 4 days     Vernell Leep, MD, [place], [facility]. [facility]    To contact the attending provider between 7A-7P or the covering provider during after hours 7P-7A, please log into the web site [email or web address] and access using universal [place] password for that web site. If you do not have the password, please call the  hospital operator.  06/26/2020, 1:33 PM

## 2020-06-26 NOTE — Progress Notes (Addendum)
Patient is refusing to have vital signs taken stating, "it is not necessary to the vital signs this often and they can be taken tonight."  Dr. Algis Liming notified.

## 2020-06-26 NOTE — Progress Notes (Signed)
Physical Therapy Treatment Patient Details Name: Tina [name] MRN: [id number] DOB: 1933/07/14 Today's Date: 06/26/2020    History of Present Illness Pt is an 86 y.o. female presenting to ED 11/23 for fall (legs gave out when son helping into car and landed on bottom) and generalized weakness; pt left ED and returned to ED 11/24 d/t weakness and difficulty with mobility.  PMH includes htn, CKD, polymyalgia rheumatica, L TKR, h/o R hip surgery, LLQ colostomy.    PT Comments    Pt agreeable to participate with therapy with mod encouragement. Pt tolerated treatment well, but was limited with progress since last session due to low motivation, generalized weakness, and decreased activity tolerance. Pt continues to require increased assistance for trunk/BLE for supine<>sit transfers and for power to stand from elevated surfaces with RW. Ambulation limited today due to decreased activity tolerance and strength in RLE, putting the pt at increased risk of falls. Pt able to participate in BLE exercises in bed to address strength deficits. Pt will continue to benefit from skilled acute PT services to address deficits for return to baseline function. Will continue to recommend SNF at DC.    Follow Up Recommendations  SNF     Equipment Recommendations  Rolling walker with 5" wheels;3in1 (PT)    Recommendations for Other Services       Precautions / Restrictions Precautions Precautions: Fall Restrictions Weight Bearing Restrictions: No    Mobility  Bed Mobility Overal bed mobility: Needs Assistance Bed Mobility: Supine to Sit;Sit to Supine     Supine to sit: Min assist;HOB elevated Sit to supine: Mod assist   General bed mobility comments: Min assist for trunk facilitation to sit EOB with HOB elevated. Mod assist for trunk/BLE facilitation to lie supine with HOB flat.  Transfers Overall transfer level: Needs assistance Equipment used: Rolling walker (2 wheeled) Transfers: Sit  to/from Stand Sit to Stand: Min assist;Mod assist         General transfer comment: Min assist for 1st trial and mod assist for 2nd trial STS from elevated bed height with RW. Mod multimodal cues provided for hand placement to ensure safety with transfer. Pt required increased time/effort to perform mobility.  Ambulation/Gait Ambulation/Gait assistance: Min guard Gait Distance (Feet): 2 Feet (4 lateral steps at EOB x2) Assistive device: Rolling walker (2 wheeled)   Gait velocity: decreased   General Gait Details: Pt required min guard for safety to take 4 steps x2 at EOB with RW. Pt demonstrated forward flexed posture, wide BOS, genu valgum (R>L), bil pronation, and decreased step length R>L, and decreased foot clearance L>R. Multimodal cues provided for sequencing and RW negotiation.   Stairs             Wheelchair Mobility    Modified Rankin (Stroke Patients Only)       Balance Overall balance assessment: Needs assistance Sitting-balance support: No upper extremity supported;Feet supported Sitting balance-Leahy Scale: Good Sitting balance - Comments: Good seated balance at EOB with feet supported.   Standing balance support: Bilateral upper extremity supported;During functional activity Standing balance-Leahy Scale: Fair Standing balance comment: Fair standing balance with BUE support on RW during transfers/gait                            Cognition Arousal/Alertness: Awake/alert Behavior During Therapy: Anxious Overall Cognitive Status: Within Functional Limits for tasks assessed  Exercises General Exercises - Lower Extremity Ankle Circles/Pumps: AROM;Strengthening;Both;15 reps Gluteal Sets: AROM;Strengthening;Both;15 reps Short Arc Quad: AROM;Both;Strengthening;15 reps Heel Slides: AROM;Strengthening;Both;15 reps Other Exercises Other Exercises: Pt able to participate in x2 trials of gait at  bedside with RW.    General Comments General comments (skin integrity, edema, etc.): BLE edema      Pertinent Vitals/Pain Pain Assessment: No/denies pain    Home Living                      Prior Function            PT Goals (current goals can now be found in the care plan section) Acute Rehab PT Goals Patient Stated Goal: to go to rehab to improve strength and mobility PT Goal Formulation: With patient Time For Goal Achievement: 07/06/20 Potential to Achieve Goals: Good Progress towards PT goals: Progressing toward goals    Frequency    Min 2X/week      PT Plan      Co-evaluation              AM-PAC PT "6 Clicks" Mobility   Outcome Measure  Help needed turning from your back to your side while in a flat bed without using bedrails?: A Little Help needed moving from lying on your back to sitting on the side of a flat bed without using bedrails?: A Little Help needed moving to and from a bed to a chair (including a wheelchair)?: A Little Help needed standing up from a chair using your arms (e.g., wheelchair or bedside chair)?: A Lot Help needed to walk in hospital room?: A Lot Help needed climbing 3-5 steps with a railing? : A Lot 6 Click Score: 15    End of Session Equipment Utilized During Treatment: Gait belt Activity Tolerance: Patient limited by fatigue Patient left: in bed;with call bell/phone within reach;with bed alarm set;with nursing/sitter in room (bed in chair position) Nurse Communication: Mobility status;Precautions PT Visit Diagnosis: Other abnormalities of gait and mobility (R26.89);Muscle weakness (generalized) (M62.81);History of falling (Z91.81);Difficulty in walking, not elsewhere classified (R26.2)     Time: 1040-1110 PT Time Calculation (min) (ACUTE ONLY): 30 min  Charges:  $Therapeutic Exercise: 8-22 mins $Therapeutic Activity: 8-22 mins                     Herminio Commons, PT, DPT 3:16 PM,06/26/20

## 2020-06-26 NOTE — TOC Progression Note (Signed)
Transition of Care [facility]) - Progression Note    Patient Details  Name: Tina [name] MRN: [id number] Date of Birth: [birth year]  [facility] CM/SW [place], [place] Phone Number: 06/26/2020, 9:22 AM  Clinical Narrative:     CSW contacted Tammy with [facility] about bed availability. Tammy stated that there would have been beds this weekend but current residents appealed and won. Tammy reports some bed availability on Monday, Nov 29th.   Expected Discharge Plan: [place] Barriers to Discharge: Continued Medical Work up, Other (comment) (ED SNF placement)  Expected Discharge Plan and Services Expected Discharge Plan: [place] In-house Referral: Clinical Social Work Discharge Planning Services: CM Consult Post Acute Care Choice: [place] arrangements for the past 2 months: Single Family Home                 DME Arranged: N/A DME Agency: NA       HH Arranged: NA HH Agency: NA         Social Determinants of Health (SDOH) Interventions    Readmission Risk Interventions No flowsheet data found.

## 2020-06-26 NOTE — Progress Notes (Signed)
While charge nurse was getting pt's VS, pt started crying out due to new pain in her right arm. Tylenol given and provider notified of new pain.

## 2020-06-27 DIAGNOSIS — M169 Osteoarthritis of hip, unspecified: Secondary | ICD-10-CM | POA: Diagnosis not present

## 2020-06-27 DIAGNOSIS — M353 Polymyalgia rheumatica: Secondary | ICD-10-CM | POA: Diagnosis not present

## 2020-06-27 DIAGNOSIS — G8929 Other chronic pain: Secondary | ICD-10-CM | POA: Diagnosis not present

## 2020-06-27 DIAGNOSIS — N183 Chronic kidney disease, stage 3 unspecified: Secondary | ICD-10-CM | POA: Diagnosis not present

## 2020-06-27 DIAGNOSIS — R627 Adult failure to thrive: Secondary | ICD-10-CM | POA: Diagnosis not present

## 2020-06-27 DIAGNOSIS — T8451XD Infection and inflammatory reaction due to internal right hip prosthesis, subsequent encounter: Secondary | ICD-10-CM | POA: Diagnosis not present

## 2020-06-27 DIAGNOSIS — Z743 Need for continuous supervision: Secondary | ICD-10-CM | POA: Diagnosis not present

## 2020-06-27 DIAGNOSIS — N179 Acute kidney failure, unspecified: Secondary | ICD-10-CM | POA: Diagnosis not present

## 2020-06-27 DIAGNOSIS — F419 Anxiety disorder, unspecified: Secondary | ICD-10-CM | POA: Diagnosis not present

## 2020-06-27 DIAGNOSIS — R600 Localized edema: Secondary | ICD-10-CM | POA: Diagnosis not present

## 2020-06-27 DIAGNOSIS — N189 Chronic kidney disease, unspecified: Secondary | ICD-10-CM | POA: Diagnosis not present

## 2020-06-27 DIAGNOSIS — I129 Hypertensive chronic kidney disease with stage 1 through stage 4 chronic kidney disease, or unspecified chronic kidney disease: Secondary | ICD-10-CM | POA: Diagnosis not present

## 2020-06-27 DIAGNOSIS — R488 Other symbolic dysfunctions: Secondary | ICD-10-CM | POA: Diagnosis not present

## 2020-06-27 DIAGNOSIS — F32A Depression, unspecified: Secondary | ICD-10-CM | POA: Diagnosis not present

## 2020-06-27 DIAGNOSIS — K5730 Diverticulosis of large intestine without perforation or abscess without bleeding: Secondary | ICD-10-CM | POA: Diagnosis not present

## 2020-06-27 DIAGNOSIS — M2550 Pain in unspecified joint: Secondary | ICD-10-CM | POA: Diagnosis not present

## 2020-06-27 DIAGNOSIS — W19XXXD Unspecified fall, subsequent encounter: Secondary | ICD-10-CM | POA: Diagnosis not present

## 2020-06-27 DIAGNOSIS — M6281 Muscle weakness (generalized): Secondary | ICD-10-CM | POA: Diagnosis not present

## 2020-06-27 DIAGNOSIS — Z7401 Bed confinement status: Secondary | ICD-10-CM | POA: Diagnosis not present

## 2020-06-27 DIAGNOSIS — R55 Syncope and collapse: Secondary | ICD-10-CM | POA: Diagnosis not present

## 2020-06-27 DIAGNOSIS — I10 Essential (primary) hypertension: Secondary | ICD-10-CM | POA: Diagnosis not present

## 2020-06-27 DIAGNOSIS — G4700 Insomnia, unspecified: Secondary | ICD-10-CM | POA: Diagnosis not present

## 2020-06-27 LAB — RESP PANEL BY RT-PCR (FLU A&B, COVID) ARPGX2
Influenza A by PCR: NEGATIVE
Influenza B by PCR: NEGATIVE
SARS Coronavirus 2 by RT PCR: NEGATIVE

## 2020-06-27 MED ORDER — ACETAMINOPHEN 325 MG PO TABS: 650.0000 mg | ORAL_TABLET | Freq: Three times a day (TID) | ORAL | Status: AC | PRN

## 2020-06-27 NOTE — Progress Notes (Signed)
Mobility Specialist - Progress Note   06/27/20 1200  Mobility  Range of Motion/Exercises Right leg;Left leg (ankle pumps, quad sets, slr, hip abd/add, hip iso)  Level of Assistance Standby assist, set-up cues, supervision of patient - no hands on  Assistive Device None  Distance Ambulated (ft) 0 ft  Mobility Response Tolerated well  Mobility performed by Mobility specialist  $Mobility charge 1 Mobility    Pre-mobility: 76 HR, 95% SpO2 Post-mobility: 76 HR, 96% SpO2   Pt was lying in bed upon arrival. Pt agreed to session. Pt appears sad and voiced her concern for returning to baseline of independence. Pt denied any nausea or fatigue, but did c/o pain in RLE. Although, pt reports pain in RLE, pt reacts to pain when gently touching LLE. OOB activity deferred d/t safety concerns. Pt was able to perform supine exercises: ankle pumps, quad sets, straight leg raises, hip isometrics, and hip abd/add with supervision. Pt was educated on the importance of mobility to enhance strength and improve overall performance. Pt was encouraged to continue exercises throughout the day. Overall, pt tolerated session well. Pt was left in bed with all needs in reach and alarm set. Nurse notified.    Kathee Delton Mobility Specialist 06/27/20, 12:42 PM

## 2020-06-27 NOTE — Discharge Instructions (Signed)

## 2020-06-27 NOTE — TOC Progression Note (Addendum)
Transition of Care [facility]) - Progression Note    Patient Details  Name: Tina [name] MRN: [id number] Date of Birth: 07-11-1933  [facility] CM/SW [place], LCSW Phone Number: 06/27/2020, 9:52 AM  Clinical Narrative:  [facility] admissions coordinator is checking on bed availability for today.   11:47 am: [facility] has a bed available today. Patient is aware and agreeable but stated she's afraid because she doesn't know what to expect. She wants her son to come sit with her while we wait for discharge. CSW sent secure chat to MD to let him know she has a bed today and will need a rapid COVID test.  12:20 pm: Tried calling son x 2. No answer. Voicemail is not set up.  1:19 pm: Tried calling son's home number but is has been disconnected.  Expected Discharge Plan: [place] Barriers to Discharge: Continued Medical Work up, Other (comment) (ED SNF placement)  Expected Discharge Plan and Services Expected Discharge Plan: [place] In-house Referral: Clinical Social Work Discharge Planning Services: CM Consult Post Acute Care Choice: [place] arrangements for the past 2 months: Single Family Home                 DME Arranged: N/A DME Agency: NA       HH Arranged: NA HH Agency: NA         Social Determinants of Health (SDOH) Interventions    Readmission Risk Interventions No flowsheet data found.

## 2020-06-27 NOTE — Discharge Summary (Signed)
Physician Discharge Summary  Tina [name] OEV:[id number] DOB: Jun 21, 1933  PCP: Virginia Crews, MD  Admitted from: Home Discharged to: SNF  Admit date: 06/22/2020 Discharge date: 06/27/2020  Recommendations for Outpatient Follow-up:    Contact information for follow-up providers    MD at SNF. Schedule an appointment as soon as possible for a visit.   Why: To be seen in 3 to 4 days with repeat labs (CBC & BMP).       Virginia Crews, MD. Schedule an appointment as soon as possible for a visit.   Specialty: Family Medicine Why: Upon discharge from SNF. Contact information: [address] [place] [address] [phone]            Contact information for after-discharge care    Destination    [facility] Preferred SNF .   Service: Skilled Nursing Contact information: [place]: None    Equipment/Devices: None    Discharge Condition: Improved and stable.   Code Status: DNR Diet recommendation:  Discharge Diet Orders (From admission, onward)    Start     Ordered   06/27/20 0000  Diet general        06/27/20 1201           Discharge Diagnoses:  Active Problems:   Failure to thrive (child)   Pressure injury of skin   Brief Summary: 85 year old female, reportedly lives alone and ambulates with the help of her 4 wheeled walker, past medical history of hypertension, osteoarthritis, s/p right total hip arthroplasty in 7169 complicated by chronic infection for which she is on suppressive doxycycline therapy, polymyalgia rheumatica and chronic kidney disease presented to the ED following a recent fall and failure to thrive.  She reportedly was having hypotension, presyncope, becoming significantly weak and unable to care for herself over the last month.  She was not able to walk for the last several weeks.  She was seen in the ED  the day prior but declined admission.  Continue to report reduced appetite, feeling tired and weak.  She was admitted due to acute kidney injury from dehydration, failure to thrive and recent fall. Improved.   Assessment & Plan:   Acute kidney injury complicating stage IIIa chronic kidney disease/dehydration: Creatinine 0.9 on 11/23/2019.  Had creatinine of 1.55 on 11/23.  Likely related to dehydration from poor oral intake.  Resolved after IV fluids.  Encouraged oral fluid intake.  Periodically follow BMP at SNF.  Adult failure to thrive/recent fall: Multifactorial secondary to multiple severe significant comorbidities: Dehydration, AKI, recent fall complicating advanced age and frail physical status.  Pelvic x-rays negative for acute findings.  SNF for rehab.  Essential hypertension: Controlled on metoprolol and lisinopril, continue.  Chronic postop right hip infection: Continue suppressive doxycycline.  Anemia, suspect chronic disease Stable.  S/p colostomy Noted.  Acute urinary retention Reportedly had Foley catheter placed in the ED for volume of 340 mL.  Discontinued Foley catheter 11/27.  Has been voiding without difficulty.  Right elbow pain Improving compared to last night.  X-ray without acute bony abnormality.    Pain resolved per patient.  Body mass index is 29.29 kg/m./Obesity  Pressure Injury 06/23/20 Sacrum Stage 2 -  Partial thickness loss of dermis presenting as a shallow open injury with a red, pink wound bed without slough. Stage 11  Pressure sore to mid sacrum (Active)  06/23/20 1808  Location: Sacrum  Location Orientation:   Staging: Stage 2 -  Partial thickness loss of dermis presenting as a shallow open injury with a red, pink wound bed without slough.  Wound Description (Comments): Stage 11 Pressure sore to mid sacrum  Present on Admission:       Consultants:   None  Procedures:   None   Discharge Instructions  Discharge  Instructions    Call MD for:  difficulty breathing, headache or visual disturbances   Complete by: As directed    Call MD for:  extreme fatigue   Complete by: As directed    Call MD for:  persistant dizziness or light-headedness   Complete by: As directed    Call MD for:  persistant nausea and vomiting   Complete by: As directed    Call MD for:  redness, tenderness, or signs of infection (pain, swelling, redness, odor or green/yellow discharge around incision site)   Complete by: As directed    Call MD for:  severe uncontrolled pain   Complete by: As directed    Call MD for:  temperature >100.4   Complete by: As directed    Diet general   Complete by: As directed    Discharge wound care:   Complete by: As directed    Pressure Injury 06/23/20 Sacrum Stage 2 -  Partial thickness loss of dermis presenting as a shallow open injury with a red, pink wound bed without slough. Stage 11 Pressure sore to mid sacrum.  Foam dressing  Every 3 days     Comments: Silicone foam to the intergluteal cleft wound, change every 3 days and PRN soilage.  Lift q shift for assessment for any acute changes.   Increase activity slowly   Complete by: As directed        Medication List    TAKE these medications   acetaminophen 325 MG tablet Commonly known as: TYLENOL Take 2 tablets (650 mg total) by mouth every 8 (eight) hours as needed for mild pain or moderate pain.   diphenhydramine-acetaminophen 25-500 MG Tabs tablet Commonly known as: TYLENOL PM Take 1 tablet by mouth at bedtime as needed (pain or sleep).   doxycycline 100 MG capsule Commonly known as: VIBRAMYCIN Take 1 capsule (100 mg total) by mouth 2 (two) times daily.   lisinopril 20 MG tablet Commonly known as: ZESTRIL Take 1 tablet (20 mg total) by mouth daily.   metoprolol tartrate 50 MG tablet Commonly known as: LOPRESSOR Take 1 tablet (50 mg total) by mouth 2 (two) times daily.   multivitamin tablet Take 1 tablet by mouth  daily.      Allergies  Allergen Reactions  . Erythromycin       Procedures/Studies: DG Chest 2 View  Result Date: 06/21/2020 CLINICAL DATA:  Pain status post fall. EXAM: CHEST - 2 VIEW COMPARISON:  October 09, 2015 FINDINGS: The heart size and mediastinal contours are within normal limits. Both lungs are clear. The visualized skeletal structures are unremarkable. Atherosclerotic changes are noted of the thoracic aorta. The thoracic aorta is tortuous. IMPRESSION: No active cardiopulmonary disease. Electronically Signed   By: Constance Holster M.D.   On: 06/21/2020 22:22   DG ELBOW COMPLETE RIGHT (3+VIEW)  Result Date: 06/26/2020 CLINICAL DATA:  Elbow pain EXAM: RIGHT ELBOW - COMPLETE 3+ VIEW COMPARISON:  None. FINDINGS: There is no evidence of fracture, dislocation, or joint effusion. There is no evidence of arthropathy or other  focal bone abnormality. Diffuse soft tissue edema. IMPRESSION: 1. No acute bone abnormality. 2. Soft tissue edema. Electronically Signed   By: Kerby Moors M.D.   On: 06/26/2020 10:47   DG Pelvis Portable  Result Date: 06/22/2020 CLINICAL DATA:  Fall. EXAM: PORTABLE PELVIS 1-2 VIEWS COMPARISON:  None. FINDINGS: There is no evidence of pelvic fracture or diastasis. No pelvic bone lesions are seen. Status post right total hip arthroplasty. IMPRESSION: Negative. Electronically Signed   By: Marijo Conception M.D.   On: 06/22/2020 13:19      Subjective: Continues to be hard of hearing because her hearing aids do not have batteries.  I had discussed with her son to try and get batteries for her hearing aids.  Denies complaints.  States that her right elbow does not hurt any longer.  As per RN, had an uneventful night and no acute issues noted.  Discharge Exam:  Vitals:   06/26/20 1600 06/26/20 2215 06/27/20 0746 06/27/20 1152  BP:  96/62 (!) 126/58   Pulse:  92 75 81  Resp: 16 16 17 14   Temp:  97.8 F (36.6 C) 97.8 F (36.6 C) 97.9 F (36.6 C)  TempSrc:   Oral Oral Oral  SpO2:  100% 99%   Weight:      Height:        General exam: Elderly female, moderately built and obese lying comfortably supine in bed without distress.  Oral mucosa moist. Respiratory system: Clear to auscultation. Respiratory effort normal. Cardiovascular system: S1 & S2 heard, RRR. No JVD, murmurs, rubs, gallops or clicks. No pedal edema.   Gastrointestinal system: Abdomen is nondistended, soft and nontender. No organomegaly or masses felt. Normal bowel sounds heard.  LLQ colostomy with liquid green stools.  No blood or mucus noted. Central nervous system: Alert and oriented. No focal neurological deficits.  Hard of hearing and her hearing aids had run out of batteries Extremities: Symmetric 5 x 5 power.  Right upper extremity bruises, mild soft tissue swelling around the elbow without tenderness now able to have full range of movements in the right elbow is without pain. Skin: No rashes, lesions or ulcers Psychiatry: Judgement and insight intact. Mood & affect appropriate.     The results of significant diagnostics from this hospitalization (including imaging, microbiology, ancillary and laboratory) are listed below for reference.     Microbiology: Recent Results (from the past 240 hour(s))  Resp Panel by RT-PCR (Flu A&B, Covid) Nasopharyngeal Swab     Status: None   Collection Time: 06/22/20  1:08 PM   Specimen: Nasopharyngeal Swab; Nasopharyngeal(NP) swabs in vial transport medium  Result Value Ref Range Status   SARS Coronavirus 2 by RT PCR NEGATIVE NEGATIVE Final    Comment: (NOTE) SARS-CoV-2 target nucleic acids are NOT DETECTED.  The SARS-CoV-2 RNA is generally detectable in upper respiratory specimens during the acute phase of infection. The lowest concentration of SARS-CoV-2 viral copies this assay can detect is 138 copies/mL. A negative result does not preclude SARS-Cov-2 infection and should not be used as the sole basis for treatment or other patient  management decisions. A negative result may occur with  improper specimen collection/handling, submission of specimen other than nasopharyngeal swab, presence of viral mutation(s) within the areas targeted by this assay, and inadequate number of viral copies(<138 copies/mL). A negative result must be combined with clinical observations, patient history, and epidemiological information. The expected result is Negative.  Fact Sheet for Patients:  EntrepreneurPulse.com.au  Fact Sheet for  Healthcare Providers:  IncredibleEmployment.be  This test is no t yet approved or cleared by the [place] and  has been authorized for detection and/or diagnosis of SARS-CoV-2 by FDA under an Emergency Use Authorization (EUA). This EUA will remain  in effect (meaning this test can be used) for the duration of the COVID-19 declaration under Section 564(b)(1) of the Act, 21 U.S.C.section 360bbb-3(b)(1), unless the authorization is terminated  or revoked sooner.       Influenza A by PCR NEGATIVE NEGATIVE Final   Influenza B by PCR NEGATIVE NEGATIVE Final    Comment: (NOTE) The Xpert Xpress SARS-CoV-2/FLU/RSV plus assay is intended as an aid in the diagnosis of influenza from Nasopharyngeal swab specimens and should not be used as a sole basis for treatment. Nasal washings and aspirates are unacceptable for Xpert Xpress SARS-CoV-2/FLU/RSV testing.  Fact Sheet for Patients: EntrepreneurPulse.com.au  Fact Sheet for Healthcare Providers: IncredibleEmployment.be  This test is not yet approved or cleared by the [place] FDA and has been authorized for detection and/or diagnosis of SARS-CoV-2 by FDA under an Emergency Use Authorization (EUA). This EUA will remain in effect (meaning this test can be used) for the duration of the COVID-19 declaration under Section 564(b)(1) of the Act, 21 U.S.C. section 360bbb-3(b)(1),  unless the authorization is terminated or revoked.  Performed at [facility], [address] [place], [place] [address]      Labs: CBC: Recent Labs  Lab 06/21/20 1732 06/22/20 1308  WBC 7.9 9.0  NEUTROABS  --  6.6  HGB 11.5* 11.2*  HCT 35.4* 34.4*  MCV 96.7 96.6  PLT 290 109    Basic Metabolic Panel: Recent Labs  Lab 06/21/20 1732 06/22/20 1308 06/23/20 0904 06/24/20 0620  NA 138 138 140 141  K 4.2 4.8 3.6 3.5  CL 107 109 109 110  CO2 21* 17* 21* 22  GLUCOSE 132* 93 86 89  BUN 37* 36* 32* 32*  CREATININE 1.55* 1.31* 1.09* 0.95  CALCIUM 8.9 8.6* 8.3* 7.7*  MG  --  2.2  --   --     Liver Function Tests: Recent Labs  Lab 06/22/20 1308  AST 28  ALT 25  ALKPHOS 102  BILITOT 1.5*  PROT 5.5*  ALBUMIN 2.5*    Urinalysis    Component Value Date/Time   COLORURINE YELLOW (A) 06/22/2020 1308   APPEARANCEUR CLEAR (A) 06/22/2020 1308   APPEARANCEUR Clear 12/01/2013 0745   LABSPEC 1.021 06/22/2020 1308   LABSPEC 1.011 12/01/2013 0745   PHURINE 5.0 06/22/2020 1308   GLUCOSEU NEGATIVE 06/22/2020 1308   GLUCOSEU Negative 12/01/2013 0745   HGBUR NEGATIVE 06/22/2020 1308   [place] 06/22/2020 1308   BILIRUBINUR Negative 09/19/2016 1438   BILIRUBINUR Negative 12/01/2013 0745   KETONESUR NEGATIVE 06/22/2020 1308   PROTEINUR 30 (A) 06/22/2020 1308   UROBILINOGEN 0.2 09/19/2016 1438   NITRITE NEGATIVE 06/22/2020 1308   LEUKOCYTESUR NEGATIVE 06/22/2020 1308   LEUKOCYTESUR Negative 12/01/2013 0745    I have regularly discussed patient's care and updated her son, including planned discharge to SNF.  I spoke with him yesterday.  Time coordinating discharge: 25 minutes  SIGNED:  Vernell Leep, MD, [place], [facility]. [facility]  To contact the attending provider between 7A-7P or the covering provider during after hours 7P-7A, please log into the web site [email or web address] and access using universal [place] password for that web site. If  you do not have the password, please call the hospital operator.

## 2020-06-27 NOTE — TOC Transition Note (Signed)
Transition of Care [facility]) - CM/SW Discharge Note   Patient Details  Name: Tina [name] MRN: [id number] Date of Birth: [birth year]  Transition of Care [facility]) CM/SW Contact:  Candie Chroman, LCSW Phone Number: 06/27/2020, 2:46 PM   Clinical Narrative:  Patient has orders to discharge to [facility] SNF today. RN has already called report. EMS transport has been arranged and she is second on the list. No further concerns. CSW signing off.   Final next level of care: Skilled Nursing Facility Barriers to Discharge: Barriers Resolved   Patient Goals and CMS Choice Patient states their goals for this hospitalization and ongoing recovery are:: Go to SNF for rehab CMS Medicare.gov Compare Post Acute Care list provided to:: Patient Choice offered to / list presented to : Patient  Discharge Placement   Existing PASRR number confirmed : 06/22/20          Patient chooses bed at: [facility] [place] Patient to be transferred to facility by: EMS Name of family member notified: Patient notified her son earlier today. Patient and family notified of of transfer: 06/27/20  Discharge Plan and Services In-house Referral: Clinical Social Work Discharge Planning Services: CM Consult Post Acute Care Choice: [place]          DME Arranged: N/A DME Agency: NA       HH Arranged: NA HH Agency: NA        Social Determinants of Health (SDOH) Interventions     Readmission Risk Interventions No flowsheet data found.

## 2020-06-27 NOTE — Care Management Important Message (Signed)
Important Message  Patient Details  Name: Tina [name] MRN: [id number] Date of Birth: 13-Nov-1932   Medicare Important Message Given:  Yes     Dannette Barbara 06/27/2020, 12:13 PM

## 2020-06-27 NOTE — Progress Notes (Signed)
OT Cancellation Note  Patient Details Name: Tina [name] MRN: [id number] DOB: 1933-05-05   Cancelled Treatment:    Reason Eval/Treat Not Completed: Fatigue/lethargy limiting ability to participate;Patient declined, no reason specified. Upon attempt pt sleeping but wakes to therapist's voice. Pt reports not sleeping last night and just too tired. Becomes tearful. Active listening and encouragement provided. Pt agreeable to OT re-attempting at later time to allow for rest now.   Jeni Salles, MPH, MS, OTR/L ascom [phone] 06/27/20, 11:34 AM

## 2020-06-28 ENCOUNTER — Telehealth: Payer: Self-pay

## 2020-06-28 NOTE — Telephone Encounter (Signed)
°  Patient was recently discharged from the hospital on 06/27/20.  No TCM completed, patient does not qualify for TCM services due to being d/c to a SNF. Pt to f/u with SNF provider until d/c.  Per discharge summary patient needs follow up with PCP after leaving SNF. No f/u apt scheduled at this time.

## 2020-06-30 ENCOUNTER — Other Ambulatory Visit: Payer: Self-pay | Admitting: Family Medicine

## 2020-06-30 DIAGNOSIS — I129 Hypertensive chronic kidney disease with stage 1 through stage 4 chronic kidney disease, or unspecified chronic kidney disease: Secondary | ICD-10-CM | POA: Diagnosis not present

## 2020-06-30 DIAGNOSIS — T8451XD Infection and inflammatory reaction due to internal right hip prosthesis, subsequent encounter: Secondary | ICD-10-CM | POA: Diagnosis not present

## 2020-06-30 DIAGNOSIS — I10 Essential (primary) hypertension: Secondary | ICD-10-CM

## 2020-06-30 DIAGNOSIS — G8929 Other chronic pain: Secondary | ICD-10-CM | POA: Diagnosis not present

## 2020-06-30 DIAGNOSIS — N183 Chronic kidney disease, stage 3 unspecified: Secondary | ICD-10-CM | POA: Diagnosis not present

## 2020-06-30 DIAGNOSIS — R600 Localized edema: Secondary | ICD-10-CM | POA: Diagnosis not present

## 2020-07-09 ENCOUNTER — Other Ambulatory Visit: Payer: Self-pay | Admitting: Family Medicine

## 2020-07-09 DIAGNOSIS — I10 Essential (primary) hypertension: Secondary | ICD-10-CM

## 2020-07-09 NOTE — Telephone Encounter (Signed)
Requested medications are due for refill today yes  Requested medications are on the active medication list yes  Last refill 8/31  Last visit 12/2019  Future visit scheduled no  Notes to clinic Failed protocol due to no valid visit within 6  months.

## 2020-07-15 DIAGNOSIS — G8929 Other chronic pain: Secondary | ICD-10-CM | POA: Diagnosis not present

## 2020-07-15 DIAGNOSIS — F419 Anxiety disorder, unspecified: Secondary | ICD-10-CM | POA: Diagnosis not present

## 2020-07-15 DIAGNOSIS — F32A Depression, unspecified: Secondary | ICD-10-CM | POA: Diagnosis not present

## 2020-07-15 DIAGNOSIS — I129 Hypertensive chronic kidney disease with stage 1 through stage 4 chronic kidney disease, or unspecified chronic kidney disease: Secondary | ICD-10-CM | POA: Diagnosis not present

## 2020-07-18 DIAGNOSIS — F32A Depression, unspecified: Secondary | ICD-10-CM | POA: Diagnosis not present

## 2020-07-18 DIAGNOSIS — I129 Hypertensive chronic kidney disease with stage 1 through stage 4 chronic kidney disease, or unspecified chronic kidney disease: Secondary | ICD-10-CM | POA: Diagnosis not present

## 2020-07-18 DIAGNOSIS — F419 Anxiety disorder, unspecified: Secondary | ICD-10-CM | POA: Diagnosis not present

## 2020-07-18 DIAGNOSIS — G8929 Other chronic pain: Secondary | ICD-10-CM | POA: Diagnosis not present

## 2020-07-18 DIAGNOSIS — T8451XD Infection and inflammatory reaction due to internal right hip prosthesis, subsequent encounter: Secondary | ICD-10-CM | POA: Diagnosis not present

## 2020-07-19 DIAGNOSIS — R296 Repeated falls: Secondary | ICD-10-CM | POA: Diagnosis not present

## 2020-07-19 DIAGNOSIS — G3184 Mild cognitive impairment, so stated: Secondary | ICD-10-CM | POA: Diagnosis not present

## 2020-07-19 DIAGNOSIS — F064 Anxiety disorder due to known physiological condition: Secondary | ICD-10-CM | POA: Diagnosis not present

## 2020-07-19 DIAGNOSIS — F32A Depression, unspecified: Secondary | ICD-10-CM | POA: Diagnosis not present

## 2020-07-26 ENCOUNTER — Inpatient Hospital Stay
Admission: EM | Admit: 2020-07-26 | Discharge: 2020-07-30 | DRG: 871 | Disposition: E | Payer: Medicare Other | Source: Skilled Nursing Facility | Attending: Pulmonary Disease | Admitting: Pulmonary Disease

## 2020-07-26 ENCOUNTER — Emergency Department: Payer: Medicare Other

## 2020-07-26 ENCOUNTER — Other Ambulatory Visit: Payer: Self-pay

## 2020-07-26 DIAGNOSIS — M1990 Unspecified osteoarthritis, unspecified site: Secondary | ICD-10-CM | POA: Diagnosis present

## 2020-07-26 DIAGNOSIS — R9431 Abnormal electrocardiogram [ECG] [EKG]: Secondary | ICD-10-CM | POA: Diagnosis not present

## 2020-07-26 DIAGNOSIS — Z20822 Contact with and (suspected) exposure to covid-19: Secondary | ICD-10-CM | POA: Diagnosis present

## 2020-07-26 DIAGNOSIS — Z87891 Personal history of nicotine dependence: Secondary | ICD-10-CM

## 2020-07-26 DIAGNOSIS — I129 Hypertensive chronic kidney disease with stage 1 through stage 4 chronic kidney disease, or unspecified chronic kidney disease: Secondary | ICD-10-CM | POA: Diagnosis not present

## 2020-07-26 DIAGNOSIS — A419 Sepsis, unspecified organism: Principal | ICD-10-CM

## 2020-07-26 DIAGNOSIS — G9341 Metabolic encephalopathy: Secondary | ICD-10-CM | POA: Diagnosis not present

## 2020-07-26 DIAGNOSIS — R6521 Severe sepsis with septic shock: Secondary | ICD-10-CM | POA: Diagnosis present

## 2020-07-26 DIAGNOSIS — R051 Acute cough: Secondary | ICD-10-CM | POA: Diagnosis not present

## 2020-07-26 DIAGNOSIS — N179 Acute kidney failure, unspecified: Secondary | ICD-10-CM

## 2020-07-26 DIAGNOSIS — Z96652 Presence of left artificial knee joint: Secondary | ICD-10-CM | POA: Diagnosis present

## 2020-07-26 DIAGNOSIS — J439 Emphysema, unspecified: Secondary | ICD-10-CM | POA: Diagnosis not present

## 2020-07-26 DIAGNOSIS — Z803 Family history of malignant neoplasm of breast: Secondary | ICD-10-CM

## 2020-07-26 DIAGNOSIS — R531 Weakness: Secondary | ICD-10-CM

## 2020-07-26 DIAGNOSIS — N3000 Acute cystitis without hematuria: Secondary | ICD-10-CM

## 2020-07-26 DIAGNOSIS — Z66 Do not resuscitate: Secondary | ICD-10-CM | POA: Diagnosis not present

## 2020-07-26 DIAGNOSIS — E860 Dehydration: Secondary | ICD-10-CM | POA: Diagnosis present

## 2020-07-26 DIAGNOSIS — R6889 Other general symptoms and signs: Secondary | ICD-10-CM | POA: Diagnosis not present

## 2020-07-26 DIAGNOSIS — R609 Edema, unspecified: Secondary | ICD-10-CM | POA: Diagnosis not present

## 2020-07-26 DIAGNOSIS — I77810 Thoracic aortic ectasia: Secondary | ICD-10-CM | POA: Diagnosis not present

## 2020-07-26 DIAGNOSIS — Z515 Encounter for palliative care: Secondary | ICD-10-CM

## 2020-07-26 DIAGNOSIS — N1832 Chronic kidney disease, stage 3b: Secondary | ICD-10-CM | POA: Diagnosis present

## 2020-07-26 DIAGNOSIS — I959 Hypotension, unspecified: Secondary | ICD-10-CM | POA: Diagnosis not present

## 2020-07-26 DIAGNOSIS — M353 Polymyalgia rheumatica: Secondary | ICD-10-CM | POA: Diagnosis present

## 2020-07-26 DIAGNOSIS — N136 Pyonephrosis: Secondary | ICD-10-CM | POA: Diagnosis present

## 2020-07-26 DIAGNOSIS — Z743 Need for continuous supervision: Secondary | ICD-10-CM | POA: Diagnosis not present

## 2020-07-26 DIAGNOSIS — Z79899 Other long term (current) drug therapy: Secondary | ICD-10-CM

## 2020-07-26 DIAGNOSIS — R4702 Dysphasia: Secondary | ICD-10-CM | POA: Diagnosis not present

## 2020-07-26 DIAGNOSIS — T8451XD Infection and inflammatory reaction due to internal right hip prosthesis, subsequent encounter: Secondary | ICD-10-CM | POA: Diagnosis not present

## 2020-07-26 DIAGNOSIS — Z96641 Presence of right artificial hip joint: Secondary | ICD-10-CM | POA: Diagnosis present

## 2020-07-26 DIAGNOSIS — J9811 Atelectasis: Secondary | ICD-10-CM | POA: Diagnosis not present

## 2020-07-26 DIAGNOSIS — R404 Transient alteration of awareness: Secondary | ICD-10-CM | POA: Diagnosis not present

## 2020-07-26 DIAGNOSIS — Z881 Allergy status to other antibiotic agents status: Secondary | ICD-10-CM

## 2020-07-26 DIAGNOSIS — R0902 Hypoxemia: Secondary | ICD-10-CM | POA: Diagnosis not present

## 2020-07-26 LAB — CBC WITH DIFFERENTIAL/PLATELET
Abs Immature Granulocytes: 0.05 10*3/uL (ref 0.00–0.07)
Basophils Absolute: 0 10*3/uL (ref 0.0–0.1)
Basophils Relative: 0 %
Eosinophils Absolute: 0.1 10*3/uL (ref 0.0–0.5)
Eosinophils Relative: 0 %
HCT: 32.3 % — ABNORMAL LOW (ref 36.0–46.0)
Hemoglobin: 10.5 g/dL — ABNORMAL LOW (ref 12.0–15.0)
Immature Granulocytes: 0 %
Lymphocytes Relative: 19 %
Lymphs Abs: 2.1 10*3/uL (ref 0.7–4.0)
MCH: 30.8 pg (ref 26.0–34.0)
MCHC: 32.5 g/dL (ref 30.0–36.0)
MCV: 94.7 fL (ref 80.0–100.0)
Monocytes Absolute: 0.8 10*3/uL (ref 0.1–1.0)
Monocytes Relative: 7 %
Neutro Abs: 8.1 10*3/uL — ABNORMAL HIGH (ref 1.7–7.7)
Neutrophils Relative %: 74 %
Platelets: 286 10*3/uL (ref 150–400)
RBC: 3.41 MIL/uL — ABNORMAL LOW (ref 3.87–5.11)
RDW: 16 % — ABNORMAL HIGH (ref 11.5–15.5)
WBC: 11.2 10*3/uL — ABNORMAL HIGH (ref 4.0–10.5)
nRBC: 1 % — ABNORMAL HIGH (ref 0.0–0.2)

## 2020-07-26 LAB — LACTIC ACID, PLASMA
Lactic Acid, Venous: 1.5 mmol/L (ref 0.5–1.9)
Lactic Acid, Venous: 2.7 mmol/L (ref 0.5–1.9)

## 2020-07-26 LAB — URINALYSIS, COMPLETE (UACMP) WITH MICROSCOPIC
Bacteria, UA: NONE SEEN
Bilirubin Urine: NEGATIVE
Glucose, UA: NEGATIVE mg/dL
Ketones, ur: NEGATIVE mg/dL
Nitrite: NEGATIVE
Protein, ur: 100 mg/dL — AB
RBC / HPF: >50 RBC/hpf — ABNORMAL HIGH (ref 0–5)
Specific Gravity, Urine: 1.016 (ref 1.005–1.030)
Squamous Epithelial / HPF: NONE SEEN (ref 0–5)
WBC, UA: >50 WBC/hpf — ABNORMAL HIGH (ref 0–5)
pH: 6 (ref 5.0–8.0)

## 2020-07-26 LAB — TROPONIN I (HIGH SENSITIVITY)
Troponin I (High Sensitivity): 28 ng/L — ABNORMAL HIGH (ref <18)
Troponin I (High Sensitivity): 27 ng/L — ABNORMAL HIGH (ref <18)

## 2020-07-26 LAB — COMPREHENSIVE METABOLIC PANEL
ALT: 33 U/L (ref 0–44)
AST: 40 U/L (ref 15–41)
Albumin: 2.3 g/dL — ABNORMAL LOW (ref 3.5–5.0)
Alkaline Phosphatase: 113 U/L (ref 38–126)
Anion gap: 14 (ref 5–15)
BUN: 128 mg/dL — ABNORMAL HIGH (ref 8–23)
CO2: 19 mmol/L — ABNORMAL LOW (ref 22–32)
Calcium: 8.5 mg/dL — ABNORMAL LOW (ref 8.9–10.3)
Chloride: 105 mmol/L (ref 98–111)
Creatinine, Ser: 3.36 mg/dL — ABNORMAL HIGH (ref 0.44–1.00)
GFR, Estimated: 13 mL/min — ABNORMAL LOW (ref >60)
Glucose, Bld: 106 mg/dL — ABNORMAL HIGH (ref 70–99)
Potassium: 5.3 mmol/L — ABNORMAL HIGH (ref 3.5–5.1)
Sodium: 138 mmol/L (ref 135–145)
Total Bilirubin: 1.3 mg/dL — ABNORMAL HIGH (ref 0.3–1.2)
Total Protein: 5.3 g/dL — ABNORMAL LOW (ref 6.5–8.1)

## 2020-07-26 LAB — RESP PANEL BY RT-PCR (FLU A&B, COVID) ARPGX2
Influenza A by PCR: NEGATIVE
Influenza B by PCR: NEGATIVE
SARS Coronavirus 2 by RT PCR: NEGATIVE

## 2020-07-26 LAB — MAGNESIUM: Magnesium: 2.1 mg/dL (ref 1.7–2.4)

## 2020-07-26 LAB — PROCALCITONIN: Procalcitonin: 0.45 ng/mL

## 2020-07-26 MED ORDER — NOREPINEPHRINE 4 MG/250ML-% IV SOLN
0.0000 ug/min | INTRAVENOUS | Status: DC
  Administered 2020-07-27: 05:00:00 10 ug/min via INTRAVENOUS
  Filled 2020-07-26: qty 250

## 2020-07-26 MED ORDER — SODIUM CHLORIDE 0.9 % IV SOLN
250.0000 mL | INTRAVENOUS | Status: DC
  Administered 2020-07-26: 21:00:00 250 mL via INTRAVENOUS

## 2020-07-26 MED ORDER — LACTATED RINGERS IV BOLUS
1000.0000 mL | Freq: Once | INTRAVENOUS | Status: AC
  Administered 2020-07-26: 20:00:00 1000 mL via INTRAVENOUS

## 2020-07-26 MED ORDER — VANCOMYCIN HCL IN DEXTROSE 1-5 GM/200ML-% IV SOLN
1000.0000 mg | Freq: Once | INTRAVENOUS | Status: AC
  Administered 2020-07-26: 23:00:00 1000 mg via INTRAVENOUS
  Filled 2020-07-26: qty 200

## 2020-07-26 MED ORDER — SODIUM CHLORIDE 0.9 % IV SOLN
2.0000 g | Freq: Once | INTRAVENOUS | Status: AC
  Administered 2020-07-26: 22:00:00 2 g via INTRAVENOUS
  Filled 2020-07-26: qty 2

## 2020-07-26 MED ORDER — LACTATED RINGERS IV BOLUS
250.0000 mL | Freq: Once | INTRAVENOUS | Status: AC
  Administered 2020-07-26: 21:00:00 250 mL via INTRAVENOUS

## 2020-07-26 MED ORDER — NOREPINEPHRINE 4 MG/250ML-% IV SOLN
INTRAVENOUS | Status: AC
  Administered 2020-07-26: 21:00:00 5 ug/min via INTRAVENOUS
  Filled 2020-07-26: qty 250

## 2020-07-26 MED ORDER — SODIUM CHLORIDE 0.9 % IV SOLN
1.0000 g | INTRAVENOUS | Status: DC
  Administered 2020-07-26: 21:00:00 1 g via INTRAVENOUS
  Filled 2020-07-26: qty 10

## 2020-07-26 MED ORDER — LACTATED RINGERS IV BOLUS
1000.0000 mL | Freq: Once | INTRAVENOUS | Status: AC
  Administered 2020-07-26: 21:00:00 1000 mL via INTRAVENOUS

## 2020-07-26 NOTE — ED Triage Notes (Signed)
Pt brought in via EMS from [facility] for AMS.  Pt alert, oriented to self, year.  Denies pain.  Reports of pitting edema to lower extremities.  Hypotensive for EMS, 70's over palp.  Marland Kitchen  CBG 129.

## 2020-07-26 NOTE — ED Notes (Signed)
Resumed care from [facility].  Pt awake and talking  nsr on monitor.  Iv meds infusing.  Pt waiting on admission bed.

## 2020-07-26 NOTE — Progress Notes (Signed)
Review of consult for IV and flowsheet. PIV x2 by RN. Consult cleared.

## 2020-07-26 NOTE — ED Provider Notes (Signed)
[facility] Emergency Department Provider Note   ____________________________________________   Event Date/Time   First MD Initiated Contact with Patient 07/25/2020 1843     (approximate)  I have reviewed the triage vital signs and the nursing notes.   HISTORY  Chief Complaint Altered Mental Status    HPI Tina [name] is a 86 y.o. female with past medical history of hypertension polymyalgia rheumatica, CKD, and hip replacement with chronic infection on doxycycline who presents to the ED for altered mental status.  History is limited due to patient's confusion.  Per EMS, staff at [facility] found patient to be confused earlier today with low blood pressure.  Blood pressure was 70s over palp with EMS however patient was A&O x4.  Patient currently states that she feels a little lightheaded but otherwise well.  She denies any fevers, cough, chest pain, shortness of breath, abdominal pain, vomiting, diarrhea, or dysuria.        Past Medical History:  Diagnosis Date  . Hypertension     Patient Active Problem List   Diagnosis Date Noted  . Pressure injury of skin 06/24/2020  . Failure to thrive (child) 06/22/2020  . Prosthetic joint infection of left hip ([place]) 11/23/2019  . Incontinence in female 11/23/2019  . Senile purpura ([place]) 04/24/2019  . CKD (chronic kidney disease) 06/08/2015  . Clinical depression 06/08/2015  . BP (high blood pressure) 06/08/2015  . Cannot sleep 06/08/2015  . Venous lake 06/08/2015  . Adynamia 06/08/2015  . Polymyalgia rheumatica ([place]) 05/27/2013  . Difficulty hearing 07/08/2009  . Accumulation of fluid in tissues 02/14/2009  . Menopausal and perimenopausal disorder 02/14/2009  . Excessive urination at night 02/14/2009  . 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 Colon, diverticulosis 08/19/2003  . Diaphragmatic hernia 08/19/2003  . Female genuine stress incontinence 08/19/2002  . Arthritis,  degenerative 01/20/2001  . Adiposity 12/21/1994    Past Surgical History:  Procedure Laterality Date  . ABDOMINAL HYSTERECTOMY  1980's  . COLON SURGERY  06/08/13   s/p ruptured diverticulitis, has fistula; had TPN for 3 mths  . DILATION AND CURETTAGE OF UTERUS  1970's  . HIP SURGERY Right 06/01/2013  . KNEE SURGERY Left 2004   total knee replacement     Prior to Admission medications   Medication Sig Start Date End Date Taking? Authorizing Provider  acetaminophen (TYLENOL) 325 MG tablet Take 2 tablets (650 mg total) by mouth every 8 (eight) hours as needed for mild pain or moderate pain. 06/27/20   Hongalgi, Lenis Dickinson, MD  diphenhydramine-acetaminophen (TYLENOL PM) 25-500 MG TABS tablet Take 1 tablet by mouth at bedtime as needed (pain or sleep).     [provider]  doxycycline (VIBRAMYCIN) 100 MG capsule Take 1 capsule (100 mg total) by mouth 2 (two) times daily. 05/30/20   Tsosie Billing, MD  lisinopril (ZESTRIL) 20 MG tablet TAKE 1 TABLET DAILY 06/30/20   Virginia Crews, MD  metoprolol tartrate (LOPRESSOR) 50 MG tablet TAKE 1 TABLET TWICE A DAY 07/11/20   Virginia Crews, MD  Multiple Vitamin (MULTIVITAMIN) tablet Take 1 tablet by mouth daily.    [provider]    Allergies Erythromycin  Family History  Problem Relation Age of Onset  . Cancer Mother        breast    Social History Social History   Tobacco Use  . Smoking status: Former Smoker    Quit date: 07/30/1979    Years since quitting: 41.0  .  Smokeless tobacco: Never Used  Vaping Use  . Vaping Use: Never used  Substance Use Topics  . Alcohol use: No    Comment: 0-1 scotch drink(s) a month  . Drug use: No    Review of Systems  Constitutional: No fever/chills.  Positive for lightheadedness and confusion. Eyes: No visual changes. ENT: No sore throat. Cardiovascular: Denies chest pain. Respiratory: Denies shortness of breath. Gastrointestinal: No abdominal pain.  No  nausea, no vomiting.  No diarrhea.  No constipation. Genitourinary: Negative for dysuria. Musculoskeletal: Negative for back pain. Skin: Negative for rash. Neurological: Negative for headaches, focal weakness or numbness.  ____________________________________________   PHYSICAL EXAM:  VITAL SIGNS: ED Triage Vitals  Enc Vitals Group     BP      Pulse      Resp      Temp      Temp src      SpO2      Weight      Height      Head Circumference      [facility]      Pain Score      Pain Loc      Pain Edu?      Excl. in [place]?     Constitutional: Alert and oriented to person place and time. Eyes: Conjunctivae are normal. Head: Atraumatic. Nose: No congestion/rhinnorhea. Mouth/Throat: Mucous membranes are very dry. Neck: Normal ROM Cardiovascular: Normal rate, regular rhythm. Grossly normal heart sounds. Respiratory: Normal respiratory effort.  No retractions. Lungs CTAB. Gastrointestinal: Soft and nontender. No distention. Genitourinary: deferred Musculoskeletal: No lower extremity tenderness nor edema. Neurologic:  Normal speech and language. No gross focal neurologic deficits are appreciated. Skin:  Skin is warm, dry and intact. No rash noted. Psychiatric: Mood and affect are normal. Speech and behavior are normal.  ____________________________________________   LABS (all labs ordered are listed, but only abnormal results are displayed)  Labs Reviewed  CBC WITH DIFFERENTIAL/PLATELET - Abnormal; Notable for the following components:      Result Value   WBC 11.2 (*)    RBC 3.41 (*)    Hemoglobin 10.5 (*)    HCT 32.3 (*)    RDW 16.0 (*)    nRBC 1.0 (*)    Neutro Abs 8.1 (*)    All other components within normal limits  COMPREHENSIVE METABOLIC PANEL - Abnormal; Notable for the following components:   Potassium 5.3 (*)    CO2 19 (*)    Glucose, Bld 106 (*)    BUN 128 (*)    Creatinine, Ser 3.36 (*)    Calcium 8.5 (*)    Total Protein 5.3 (*)    Albumin 2.3 (*)     Total Bilirubin 1.3 (*)    GFR, Estimated 13 (*)    All other components within normal limits  URINALYSIS, COMPLETE (UACMP) WITH MICROSCOPIC - Abnormal; Notable for the following components:   Color, Urine COLORLESS (*)    APPearance TURBID (*)    Hgb urine dipstick LARGE (*)    Protein, ur 100 (*)    Leukocytes,Ua MODERATE (*)    RBC / HPF >50 (*)    WBC, UA >50 (*)    All other components within normal limits  LACTIC ACID, PLASMA - Abnormal; Notable for the following components:   Lactic Acid, Venous 2.7 (*)    All other components within normal limits  TROPONIN I (HIGH SENSITIVITY) - Abnormal; Notable for the following components:   Troponin I (High Sensitivity)  28 (*)    All other components within normal limits  TROPONIN I (HIGH SENSITIVITY) - Abnormal; Notable for the following components:   Troponin I (High Sensitivity) 27 (*)    All other components within normal limits  RESP PANEL BY RT-PCR (FLU A&B, COVID) [id number]  CULTURE, BLOOD (ROUTINE X 2)  CULTURE, BLOOD (ROUTINE X 2)  URINE CULTURE  LACTIC ACID, PLASMA  PROCALCITONIN  MAGNESIUM  PROCALCITONIN   ____________________________________________  EKG  ED ECG REPORT I, Chesley Noon, the attending physician, personally viewed and interpreted this ECG.   Date: 07/27/2020  EKG Time: 19:52  Rate: 77  Rhythm: normal sinus rhythm  Axis: Normal  Intervals:Prolonged QT  ST&T Change: Nonspecific T wave changes   PROCEDURES  Procedure(s) performed (including Critical Care):  .Critical Care Performed by: Chesley Noon, MD Authorized by: Chesley Noon, MD   Critical care provider statement:    Critical care time (minutes):  45   Critical care time was exclusive of:  Separately billable procedures and treating other patients and teaching time   Critical care was necessary to treat or prevent imminent or life-threatening deterioration of the following conditions:  Sepsis   Critical care was time spent  personally by me on the following activities:  Discussions with consultants, evaluation of patient's response to treatment, examination of patient, ordering and performing treatments and interventions, ordering and review of laboratory studies, ordering and review of radiographic studies, pulse oximetry, re-evaluation of patient's condition, obtaining history from patient or surrogate and review of old charts   I assumed direction of critical care for this patient from another provider in my specialty: no       ____________________________________________   INITIAL IMPRESSION / ASSESSMENT AND PLAN / ED COURSE       86 year old female with past medical history of hypertension, polymyalgia rheumatica, CKD, and chronic hip infection on doxycycline who presents to the ED for altered mental status and low blood pressure noted at nursing facility.  On arrival, patient is alert and oriented x4 with no apparent focal neurologic deficits.  She does appear globally weak with low blood pressure, remainder vital signs are reassuring but given her confusion we will start sepsis work-up.  Labs remarkable for AKI with significantly elevated BUN, consistent with dehydration.  UA also concerning for UTI.  Patient was treated with IV fluid bolus as well as Rocephin, overall picture concerning for sepsis.  Chest x-ray reviewed by me and shows no infiltrate, edema, or effusion.  Unfortunately, blood pressure remains low following 30 cc/kg of IV fluids and we will start patient on Levophed.  She has progressively become more disoriented during her ED stay, likely secondary to sepsis.  Case discussed with ICU provider, and we will broaden antibiotics with cefepime and vancomycin.  Blood pressure has stabilized on 9 mcg/min of Levophed.  I spoke with patient's son over the phone, who confirmed that patient would not like to be intubated or receive chest compressions in the event of cardiac arrest.       ____________________________________________   FINAL CLINICAL IMPRESSION(S) / ED DIAGNOSES  Final diagnoses:  Sepsis with acute renal failure and septic shock, due to unspecified organism, unspecified acute renal failure type (HCC)  Acute cystitis without hematuria     ED Discharge Orders    None       Note:  This document was prepared using Dragon voice recognition software and may include unintentional dictation errors.   Chesley Noon, MD 07/05/2020 2303

## 2020-07-26 NOTE — Progress Notes (Signed)
CODE SEPSIS - PHARMACY COMMUNICATION  **Broad Spectrum Antibiotics should be administered within 1 hour of Sepsis diagnosis**  Time Code Sepsis Called/Page Received: 2018  Antibiotics Ordered: 2030  Time of 1st antibiotic administration: 2033  Additional action taken by pharmacy: n/a   If necessary, Name of Provider/Nurse Contacted: n/a    Martyn Malay ,PharmD Clinical Pharmacist  2020-08-21  8:18 PM

## 2020-07-26 NOTE — ED Notes (Signed)
Unable to validate BP from monitor - dynamap at bedside monitoring BP. Charge aware

## 2020-07-27 ENCOUNTER — Encounter: Payer: Self-pay | Admitting: Pulmonary Disease

## 2020-07-27 ENCOUNTER — Inpatient Hospital Stay: Payer: Medicare Other

## 2020-07-27 DIAGNOSIS — Z20822 Contact with and (suspected) exposure to covid-19: Secondary | ICD-10-CM | POA: Diagnosis present

## 2020-07-27 DIAGNOSIS — Z87891 Personal history of nicotine dependence: Secondary | ICD-10-CM | POA: Diagnosis not present

## 2020-07-27 DIAGNOSIS — Z881 Allergy status to other antibiotic agents status: Secondary | ICD-10-CM | POA: Diagnosis not present

## 2020-07-27 DIAGNOSIS — N1832 Chronic kidney disease, stage 3b: Secondary | ICD-10-CM | POA: Diagnosis present

## 2020-07-27 DIAGNOSIS — E860 Dehydration: Secondary | ICD-10-CM | POA: Diagnosis present

## 2020-07-27 DIAGNOSIS — A419 Sepsis, unspecified organism: Secondary | ICD-10-CM | POA: Diagnosis not present

## 2020-07-27 DIAGNOSIS — Z96652 Presence of left artificial knee joint: Secondary | ICD-10-CM | POA: Diagnosis present

## 2020-07-27 DIAGNOSIS — Z79899 Other long term (current) drug therapy: Secondary | ICD-10-CM | POA: Diagnosis not present

## 2020-07-27 DIAGNOSIS — R6521 Severe sepsis with septic shock: Secondary | ICD-10-CM | POA: Diagnosis not present

## 2020-07-27 DIAGNOSIS — M353 Polymyalgia rheumatica: Secondary | ICD-10-CM | POA: Diagnosis present

## 2020-07-27 DIAGNOSIS — I129 Hypertensive chronic kidney disease with stage 1 through stage 4 chronic kidney disease, or unspecified chronic kidney disease: Secondary | ICD-10-CM | POA: Diagnosis present

## 2020-07-27 DIAGNOSIS — N136 Pyonephrosis: Secondary | ICD-10-CM | POA: Diagnosis present

## 2020-07-27 DIAGNOSIS — N1330 Unspecified hydronephrosis: Secondary | ICD-10-CM | POA: Diagnosis not present

## 2020-07-27 DIAGNOSIS — Z96641 Presence of right artificial hip joint: Secondary | ICD-10-CM | POA: Diagnosis present

## 2020-07-27 DIAGNOSIS — M1990 Unspecified osteoarthritis, unspecified site: Secondary | ICD-10-CM | POA: Diagnosis present

## 2020-07-27 DIAGNOSIS — Z66 Do not resuscitate: Secondary | ICD-10-CM | POA: Diagnosis not present

## 2020-07-27 DIAGNOSIS — G9341 Metabolic encephalopathy: Secondary | ICD-10-CM | POA: Diagnosis present

## 2020-07-27 DIAGNOSIS — Z515 Encounter for palliative care: Secondary | ICD-10-CM | POA: Diagnosis not present

## 2020-07-27 DIAGNOSIS — N201 Calculus of ureter: Secondary | ICD-10-CM | POA: Diagnosis not present

## 2020-07-27 DIAGNOSIS — N179 Acute kidney failure, unspecified: Secondary | ICD-10-CM | POA: Diagnosis not present

## 2020-07-27 DIAGNOSIS — Z803 Family history of malignant neoplasm of breast: Secondary | ICD-10-CM | POA: Diagnosis not present

## 2020-07-27 LAB — CBC WITH DIFFERENTIAL/PLATELET
Abs Immature Granulocytes: 0.16 10*3/uL — ABNORMAL HIGH (ref 0.00–0.07)
Basophils Absolute: 0 10*3/uL (ref 0.0–0.1)
Basophils Relative: 0 %
Eosinophils Absolute: 0.1 10*3/uL (ref 0.0–0.5)
Eosinophils Relative: 0 %
HCT: 30.8 % — ABNORMAL LOW (ref 36.0–46.0)
Hemoglobin: 10.4 g/dL — ABNORMAL LOW (ref 12.0–15.0)
Immature Granulocytes: 1 %
Lymphocytes Relative: 3 %
Lymphs Abs: 0.6 10*3/uL — ABNORMAL LOW (ref 0.7–4.0)
MCH: 31.6 pg (ref 26.0–34.0)
MCHC: 33.8 g/dL (ref 30.0–36.0)
MCV: 93.6 fL (ref 80.0–100.0)
Monocytes Absolute: 0.7 10*3/uL (ref 0.1–1.0)
Monocytes Relative: 4 %
Neutro Abs: 16.8 10*3/uL — ABNORMAL HIGH (ref 1.7–7.7)
Neutrophils Relative %: 92 %
Platelets: 280 10*3/uL (ref 150–400)
RBC: 3.29 MIL/uL — ABNORMAL LOW (ref 3.87–5.11)
RDW: 16.1 % — ABNORMAL HIGH (ref 11.5–15.5)
WBC: 18.3 10*3/uL — ABNORMAL HIGH (ref 4.0–10.5)
nRBC: 2 % — ABNORMAL HIGH (ref 0.0–0.2)

## 2020-07-27 LAB — BASIC METABOLIC PANEL
Anion gap: 12 (ref 5–15)
BUN: 107 mg/dL — ABNORMAL HIGH (ref 8–23)
CO2: 19 mmol/L — ABNORMAL LOW (ref 22–32)
Calcium: 8.1 mg/dL — ABNORMAL LOW (ref 8.9–10.3)
Chloride: 107 mmol/L (ref 98–111)
Creatinine, Ser: 2.74 mg/dL — ABNORMAL HIGH (ref 0.44–1.00)
GFR, Estimated: 16 mL/min — ABNORMAL LOW (ref >60)
Glucose, Bld: 146 mg/dL — ABNORMAL HIGH (ref 70–99)
Potassium: 5.1 mmol/L (ref 3.5–5.1)
Sodium: 138 mmol/L (ref 135–145)

## 2020-07-27 LAB — MAGNESIUM: Magnesium: 1.8 mg/dL (ref 1.7–2.4)

## 2020-07-27 LAB — MRSA PCR SCREENING: MRSA by PCR: NEGATIVE

## 2020-07-27 LAB — PHOSPHORUS: Phosphorus: 5.3 mg/dL — ABNORMAL HIGH (ref 2.5–4.6)

## 2020-07-27 LAB — PROCALCITONIN: Procalcitonin: 0.47 ng/mL

## 2020-07-27 LAB — CREATININE, URINE, RANDOM: Creatinine, Urine: 48 mg/dL

## 2020-07-27 LAB — NA AND K (SODIUM & POTASSIUM), RAND UR
Potassium Urine: 48 mmol/L
Sodium, Ur: 73 mmol/L

## 2020-07-27 LAB — GLUCOSE, CAPILLARY: Glucose-Capillary: 116 mg/dL — ABNORMAL HIGH (ref 70–99)

## 2020-07-27 MED ORDER — SODIUM CHLORIDE 0.9 % IV SOLN
1.0000 g | Freq: Three times a day (TID) | INTRAVENOUS | Status: DC
  Administered 2020-07-27: 05:00:00 1 g via INTRAVENOUS
  Filled 2020-07-27 (×2): qty 1

## 2020-07-27 MED ORDER — HEPARIN SODIUM (PORCINE) 5000 UNIT/ML IJ SOLN
5000.0000 [IU] | Freq: Three times a day (TID) | INTRAMUSCULAR | Status: DC
  Administered 2020-07-27: 05:00:00 5000 [IU] via SUBCUTANEOUS
  Filled 2020-07-27: qty 1

## 2020-07-27 MED ORDER — LORAZEPAM 2 MG/ML IJ SOLN: 2.0000 mg | INTRAMUSCULAR | Status: DC | PRN

## 2020-07-27 MED ORDER — POLYETHYLENE GLYCOL 3350 17 G PO PACK: 17.0000 g | PACK | Freq: Every day | ORAL | Status: DC | PRN

## 2020-07-27 MED ORDER — ESCITALOPRAM OXALATE 10 MG PO TABS
10.0000 mg | ORAL_TABLET | Freq: Every day | ORAL | Status: DC
  Filled 2020-07-27: qty 1

## 2020-07-27 MED ORDER — ACETAMINOPHEN 325 MG PO TABS: 650.0000 mg | ORAL_TABLET | ORAL | Status: DC | PRN

## 2020-07-27 MED ORDER — GLYCOPYRROLATE 0.2 MG/ML IJ SOLN: 0.4000 mg | INTRAMUSCULAR | Status: DC | PRN

## 2020-07-27 MED ORDER — SERTRALINE HCL 50 MG PO TABS
25.0000 mg | ORAL_TABLET | Freq: Every day | ORAL | Status: DC
  Filled 2020-07-27: qty 1

## 2020-07-27 MED ORDER — DOCUSATE SODIUM 100 MG PO CAPS: 100.0000 mg | ORAL_CAPSULE | Freq: Two times a day (BID) | ORAL | Status: DC | PRN

## 2020-07-27 MED ORDER — ADULT MULTIVITAMIN W/MINERALS CH
1.0000 | ORAL_TABLET | Freq: Every day | ORAL | Status: DC
  Filled 2020-07-27: qty 1

## 2020-07-27 MED ORDER — LACTATED RINGERS IV BOLUS
500.0000 mL | Freq: Once | INTRAVENOUS | Status: AC
  Administered 2020-07-27: 03:00:00 500 mL via INTRAVENOUS

## 2020-07-27 MED ORDER — CHLORHEXIDINE GLUCONATE CLOTH 2 % EX PADS
6.0000 | MEDICATED_PAD | Freq: Every day | CUTANEOUS | Status: DC
  Filled 2020-07-27: qty 6

## 2020-07-27 MED ORDER — HEPARIN SODIUM (PORCINE) 5000 UNIT/ML IJ SOLN: 5000.0000 [IU] | Freq: Three times a day (TID) | INTRAMUSCULAR | Status: DC

## 2020-07-27 MED ORDER — ACETAMINOPHEN 325 MG PO TABS: 650.0000 mg | ORAL_TABLET | Freq: Three times a day (TID) | ORAL | Status: DC | PRN

## 2020-07-27 MED ORDER — MORPHINE 100MG IN NS 100ML (1MG/ML) PREMIX INFUSION
1.0000 mg/h | INTRAVENOUS | Status: DC
  Administered 2020-07-27: 13:00:00 3 mg/h via INTRAVENOUS
  Administered 2020-07-28: 08:00:00 4 mg/h via INTRAVENOUS
  Filled 2020-07-27 (×2): qty 100

## 2020-07-27 MED ORDER — SODIUM CHLORIDE 0.9 % IV SOLN
1.0000 g | Freq: Three times a day (TID) | INTRAVENOUS | Status: DC
  Filled 2020-07-27 (×2): qty 1

## 2020-07-27 MED ORDER — SODIUM CHLORIDE 0.9 % IV SOLN: 2.0000 g | INTRAVENOUS | Status: DC

## 2020-07-27 NOTE — Progress Notes (Signed)
BRIEF CRITICAL CARE NOTE  Pt is currently in bed, moaning, complaining of generalized pain, appears miserable and suffering.  Pt is currently on Levophed infusion for septic shock due to UTI.  Pt is awake and alert (oriented only to person), but she is clearly stating and very adamant to "please stop doing all this to me, I am ready to die."  Pt's son Kourtlyn Charlet [facility]) is at bedside.  We discussed her critical illness with guarded prognosis. She is currently a DNR/DNI.  He reports that she has significantly declined in health over the past 2 months.  He knows that she is critically ill and that she is suffering currently.  He reports that this situation is difficult, but he wants to honor his mothers requests/wishes to be made comfortable and allowed to pass away naturally.  Edward requests that we transition to COMFORT MEASURES ONLY.  DNR/DNI, Nurse may pronounce.  Will place on Morphine gtt.     Harlon Ditty, AGACNP-BC [place] [facility] Pager: [phone]

## 2020-07-27 NOTE — H&P (Signed)
PULMONARY / CRITICAL CARE MEDICINE HISTORY & PHYSICAL   Name: Tina [name] MRN: [id number] DOB: [birth year]    LOS: 0  Admitting Provider:  07/27/20 Brief patient description:   86 year old female presents with altered mental status, hypotension, new AKI, dehydration and septic shock of urinary origin requiring vasopressors.   HPI:  Ms. Tina [name] is a 86 year old female with history of hypertension, osteoarthritis, s/p right total hip arthroplasty (123456), complicated by chronic infection on doxycycline, polymyalgia rheumatica, CKD stage III, perforated bowel due to diverticulitis with complicating fistula who presented via EMS after staff at [facility] found patient to be confused and with hypotension with systolic in the [id number].  EMS noted that she was alert and oriented x 4 initially.  The patient reported lightheadedness.  She admits to reduce oral intake.  She denies any fevers, chills, cough, chest pain, SOB, abdominal pain, nausea, vomiting, diarrhea or urinary symptoms.   In the ED, she was noted with newly elevated Cr of 3.36, BUN of 128, and lactic acid of 2.7.   Her BP was as low as 85/63 and remained low despite 2L fluid bolus.  She was started on Levophed requiring dosage of 32mcg.  CXR shows no acute process but UA micro was notable for WBC > 50.   She was given Rocephin, and upon critical care consultation, request was made to give Cefepime and Vancomycin given the patient's shock state to also cover PsA and MRSA.    Past Medical History:  Diagnosis Date  . Hypertension    Past Surgical History:  Procedure Laterality Date  . ABDOMINAL HYSTERECTOMY  1980's  . COLON SURGERY  06/08/13   s/p ruptured diverticulitis, has fistula; had TPN for 3 mths  . DILATION AND CURETTAGE OF UTERUS  1970's  . HIP SURGERY Right 06/01/2013  . KNEE SURGERY Left 2004   total knee replacement    No current facility-administered medications on file prior to encounter.   Current  Outpatient Medications on File Prior to Encounter  Medication Sig  . acetaminophen (TYLENOL) 325 MG tablet Take 2 tablets (650 mg total) by mouth every 8 (eight) hours as needed for mild pain or moderate pain.  . diphenhydramine-acetaminophen (TYLENOL PM) 25-500 MG TABS tablet Take 1 tablet by mouth at bedtime as needed (pain or sleep).   Marland Kitchen doxycycline (VIBRAMYCIN) 100 MG capsule Take 1 capsule (100 mg total) by mouth 2 (two) times daily.  Marland Kitchen escitalopram (LEXAPRO) 10 MG tablet Take 10 mg by mouth daily.  Marland Kitchen lisinopril (ZESTRIL) 20 MG tablet TAKE 1 TABLET DAILY  . metoprolol tartrate (LOPRESSOR) 50 MG tablet TAKE 1 TABLET TWICE A DAY  . Multiple Vitamin (MULTIVITAMIN) tablet Take 1 tablet by mouth daily.  . sertraline (ZOLOFT) 25 MG tablet Take 25 mg by mouth daily.    Allergies Allergies  Allergen Reactions  . Erythromycin     Family History Family History  Problem Relation Age of Onset  . Cancer Mother        breast   Social History  reports that she quit smoking about 41 years ago. She has never used smokeless tobacco. She reports that she does not drink alcohol and does not use drugs.  Review Of Systems:   All systems reviewed and negative except as noted in HPI.  VITAL SIGNS: BP 112/66 (BP Location: Left Arm)   Pulse 70   Temp 98.5 F (36.9 C) (Rectal)   Resp 20   Ht 5\' 6"  (  1.676 m)   Wt 71.7 kg   SpO2 98%   BMI 25.51 kg/m   HEMODYNAMICS: Levophed    VENTILATOR SETTINGS:  n/a    INTAKE / OUTPUT: No intake/output data recorded.  PHYSICAL EXAMINATION: General:  Laying in bed, awake, alert in NAD HEENT:  NCAT, sclera anicteric, dry MM, neck supple Neuro:  Speech fluent, moves all 4 extremities Cardiovascular:  S1S2 present, RRR, no audible murmur, gallops or rubs Lungs:  Non-labored, clear to auscultation Abdomen:  Soft, non-tender, non-distended, +BS Musculoskeletal:  1+ edema bilateral LEs, no erythema and calf swelling Skin:  Warm and  dry  LABS:  BMET Recent Labs  Lab 27-Jul-2020 1934  NA 138  K 5.3*  CL 105  CO2 19*  BUN 128*  CREATININE 3.36*  GLUCOSE 106*    Electrolytes Recent Labs  Lab 07-27-2020 1934 07/27/20 1958  CALCIUM 8.5*  --   MG  --  2.1    CBC Recent Labs  Lab 07-27-20 1934  WBC 11.2*  HGB 10.5*  HCT 32.3*  PLT 286    Coag's No results for input(s): APTT, INR in the last 168 hours.  Sepsis Markers Recent Labs  Lab 07-27-2020 1934  LATICACIDVEN 2.7*  1.5  PROCALCITON 0.45    ABG No results for input(s): PHART, PCO2ART, PO2ART in the last 168 hours.  Liver Enzymes Recent Labs  Lab 07-27-2020 1934  AST 40  ALT 33  ALKPHOS 113  BILITOT 1.3*  ALBUMIN 2.3*    Cardiac Enzymes No results for input(s): TROPONINI, PROBNP in the last 168 hours.  Glucose No results for input(s): GLUCAP in the last 168 hours.  Imaging DG Chest 1 View  Result Date: 27-Jul-2020 CLINICAL DATA:  Lower extremity edema, hypotensive EXAM: CHEST  1 VIEW COMPARISON:  06/21/2020 FINDINGS: Single frontal view of the chest demonstrates an unremarkable cardiac silhouette. Stable ectasia of the thoracic aorta. No airspace disease, effusion, or pneumothorax. IMPRESSION: 1. No acute intrathoracic process. Electronically Signed   By: Sharlet Salina M.D.   On: 2020-07-27 19:20    STUDIES:  CXR:   IMPRESSION: 1. No acute intrathoracic process.  CULTURES: Blood:  pending Urine: pending  ANTIBIOTICS: Rocephin x1 Cefepime Vancomycin  SIGNIFICANT EVENTS: n/a  LINES/TUBES: Peripheral IV x3    ASSESSMENT / PLAN:  Ms. Tina [name] is a 86 year old female presents with altered mental status, hypotension, new AKI, dehydration and septic shock of urinary origin requiring vasopressors.   Septic shock due to urinary tract infection UA shows WBC > 50, and has mild leukocytosis of 11k with lactic acid of 2.7.  CXR without any signs of acute abnormality.  COVID and influenza A/B were negative.    Admit to ICU  Empiric antibiotics -- will continue with Cefepime  Received Vanc x 1 in the ED, will hold off on further Vanc pending MRSA PCR swab which has good negative predictive values for many infections including urinary source  LR 500 mL bolus  Trend lactic acid  Continue vasopressor support to maintain MAP > 65  If begin to require increasing dosage of Levo > 15-84mcg, will consider central line placement; for now, hoping this is just short term need for pressors  Check MRSA pcr nares  Acute encephalopathy Most likely related to infection and acute toxic metabolic  Treat infection and AKI  Redirect and reorient  Avoid Benzos  Acute kidney injury on CKD stage III Cr 3.36, baseline 0.95 from 05/2020.  Likely pre-renal in the  setting of poor oral intake, mucous membrane dryness and septic shock, though may have developed ATN.    Check urine lytes to calculate FENA  Continue IV fluid hydration -- will give another LR 500 mL bolus  Strict I/Os  Avoid nephrotoxic agents   Hypertension:  Currently hypotensive requiring vasopressors thus will hold antihypertensives including Lisinopril and Metoprolol  Osteoarthritis:  Tylenol as needed for pain  s/p right total hip arthroplasty (123456), complicated by chronic infection  Continue doxycycline  Polymyalgia rheumatica:  Tylenol as needed    Best Practice: Code Status:  DNR and DNI Diet: Heart healthy GI prophylaxis:  VTE prophylaxis:  Heparin SQ  FAMILY  - Updates: Called son, no answer, voicemail not set up   Critical Care time:  45 minutes.  ___________________ Samuella Cota Reinaldo Berber, MD [facility]   07/27/2020, 1:28 AM

## 2020-07-27 NOTE — ED Notes (Signed)
Report called to jo icu nurse rn

## 2020-07-27 NOTE — Progress Notes (Signed)
Notified MD of blood pressure wide range of readings in all extremeties, no new orders at this time, will continue to monitor.

## 2020-07-27 NOTE — Progress Notes (Signed)
Pharmacy Antibiotic Note  Tina [name] is a 86 y.o. female admitted on 08/15/2020 with UTI.  Pharmacy has been consulted for cefepime (CCM) dosing.  Plan: Pt given Cefepime 2gm on 12/28 @ 2217 followed by 1gm on 12/29 @ 0516.  Per recommended dosing guidelines based on pt CrCl, transitioned pt to Cefepime 2mg  to start 12/30 @ 0600.  Pharmacy will continue to monitor for lab results and follow CrCl and adjust dose if warranted.  Height: 5\' 6"  (167.6 cm) Weight: 71.7 kg (158 lb 1.1 oz) IBW/kg (Calculated) : 59.3  Temp (24hrs), Avg:97.7 F (36.5 C), Min:97.3 F (36.3 C), Max:98.5 F (36.9 C)  Recent Labs  Lab 08/15/2020 1934 07/27/20 0449  WBC 11.2* 18.3*  CREATININE 3.36* 2.74*  LATICACIDVEN 2.7*  1.5  --     Estimated Creatinine Clearance: 15.2 mL/min (A) (by C-G formula based on SCr of 2.74 mg/dL (H)).    Allergies  Allergen Reactions  . Erythromycin     Antimicrobials this admission: 12/28 Vanc >> Once 12/28 Ceftriaxone >> Once 12/28 Cefepime >>   Microbiology results: 12/28 BCx: Pending 12./28 UCx: Pending   Thank you for allowing pharmacy to be a part of this patient's care.  1/29, PharmD, [facility] 07/27/2020 6:27 AM

## 2020-07-28 DIAGNOSIS — Z515 Encounter for palliative care: Secondary | ICD-10-CM

## 2020-07-28 DIAGNOSIS — R6521 Severe sepsis with septic shock: Secondary | ICD-10-CM | POA: Diagnosis not present

## 2020-07-28 DIAGNOSIS — Z66 Do not resuscitate: Secondary | ICD-10-CM | POA: Diagnosis not present

## 2020-07-28 DIAGNOSIS — A419 Sepsis, unspecified organism: Secondary | ICD-10-CM | POA: Diagnosis not present

## 2020-07-28 LAB — UREA NITROGEN, URINE: Urea Nitrogen, Ur: 391 mg/dL

## 2020-07-28 NOTE — Consult Note (Signed)
Consultation Note Date: 07/28/2020   Patient Name: Tina [name]  DOB: 09-16-1932  MRN: [id number]  Age / Sex: 86 y.o., female  PCP: Virginia Crews, MD Referring Physician: Cheryll Dessert, MD  Reason for Consultation: Establishing goals of care and Psychosocial/spiritual support  HPI/Patient Profile: 86 y.o. female  admitted on 07/24/2020 with a past medical  history of hypertension, osteoarthritis, s/p right total hip arthroplasty (123456), complicated by chronic infection on doxycycline, polymyalgia rheumatica, CKD stage III, perforated bowel due to diverticulitis with complicating fistula who presented via EMS after staff at [facility] found patient to be confused and with hypotension with systolic in the [id number].    In the ED, she was noted with newly elevated Cr of 3.36, BUN of 128, and lactic acid of 2.7.   Her BP was as low as 85/63 and remained low despite 2L fluid bolus.  She was started on Levophed requiring dosage of 75mcg.  CXR shows no acute process but UA micro was notable for WBC > 50.   She was given Rocephin, and upon critical care consultation, request was made to give Cefepime and Vancomycin given the patient's shock state to also cover PsA and MRSA.   Patient had continued decline last evening, was able to verbalize her desire for comfort.  Her son was able to support that decision and the patient was transitioned to comfort measures only.  Clinical Assessment and Goals of Care:  This NP Wadie Lessen reviewed medical records, received report from team, assessed the patient and then spoke the the patient's son/ Tina [name] at the patient's bedside  to discuss current medical situation, assess for palliative medicine needs and offer emotional support   Concept of Palliative Care was introduced as specialized medical care for people and their families living with serious illness.   If focuses on providing relief from the symptoms and stress of a serious illness.  The goal is to improve quality of life for both the patient and the family.  Created space and opportunity for patient  and family to explore thoughts and feelings regarding current medical situation.  Son verbalizes a clear understanding of the patient's medical situation, and the fact that his mother is at  end-of-life and that time is limited.  Emotional support offered   Focus of care is comfort and dignity at this time.    Natural trajectory and expectations at EOL were discussed.  Questions and concerns addressed.   Family   encouraged to call with questions or concerns.     PMT will continue to support holistically.           No documented healthcare power of attorney or advanced care documents however patient has only one living son and he is acting in her best interest in making decisions at this time.    SUMMARY OF RECOMMENDATIONS    Code Status/Advance Care Planning:  DNR    Symptom Management:   Pain/Dyspnea:  Morphine gtt  Agitation:  Ativan  Terminal secretions; Robinul 0.4  mg IV every 4 hours as needed  Palliative Prophylaxis:   Eye Care, Frequent Pain Assessment and Oral Care  Additional Recommendations (Limitations, Scope, Preferences):  Full Comfort Care  Psycho-social/Spiritual:   Desire for further Chaplaincy support:yes  Additional Recommendations: Education on Hospice  Prognosis:   Hours - Days  Discharge Planning: Anticipated Hospital Death      Primary Diagnoses: Present on Admission: . Septic shock (HCC)   I have reviewed the medical record, interviewed the patient and family, and examined the patient. The following aspects are pertinent.  Past Medical History:  Diagnosis Date  . Hypertension    Social History   Socioeconomic History  . Marital status: Single    Spouse name: Not on file  . Number of children: 1  . Years of  education: Not on file  . Highest education level: Master's degree (e.g., MA, MS, MEng, MEd, MSW, MBA)  Occupational History  . Occupation: retired  Tobacco Use  . Smoking status: Former Smoker    Quit date: 07/30/1979    Years since quitting: 41.0  . Smokeless tobacco: Never Used  Vaping Use  . Vaping Use: Never used  Substance and Sexual Activity  . Alcohol use: No    Comment: 0-1 scotch drink(s) a month  . Drug use: No  . Sexual activity: Not on file  Other Topics Concern  . Not on file  Social History Narrative  . Not on file   Social Determinants of Health   Financial Resource Strain: Not on file  Food Insecurity: Not on file  Transportation Needs: Not on file  Physical Activity: Not on file  Stress: Not on file  Social Connections: Not on file   Family History  Problem Relation Age of Onset  . Cancer Mother        breast   Scheduled Meds: Continuous Infusions: . sodium chloride 250 mL (02-Aug-2020 2114)  . morphine 4 mg/hr (07/28/20 0821)   PRN Meds:.acetaminophen, glycopyrrolate, LORazepam Medications Prior to Admission:  Prior to Admission medications   Medication Sig Start Date End Date Taking? Authorizing Provider  doxycycline (VIBRAMYCIN) 100 MG capsule Take 1 capsule (100 mg total) by mouth 2 (two) times daily. 05/30/20  Yes Lynn Ito, MD  escitalopram (LEXAPRO) 10 MG tablet Take 10 mg by mouth daily. 07/18/20  Yes [provider]  lisinopril (ZESTRIL) 20 MG tablet TAKE 1 TABLET DAILY Patient taking differently: Take 20 mg by mouth daily. 06/30/20  Yes Bacigalupo, Marzella Schlein, MD  metoprolol tartrate (LOPRESSOR) 50 MG tablet TAKE 1 TABLET TWICE A DAY Patient taking differently: Take 50 mg by mouth 2 (two) times daily. 07/11/20  Yes Bacigalupo, Marzella Schlein, MD  Multiple Vitamin (MULTIVITAMIN) tablet Take 1 tablet by mouth daily.   Yes [provider]  sertraline (ZOLOFT) 25 MG tablet Take 25 mg by mouth daily. 07/20/20  Yes [provider]  acetaminophen (TYLENOL) 325 MG tablet Take 2 tablets (650 mg total) by mouth every 8 (eight) hours as needed for mild pain or moderate pain. 06/27/20   Hongalgi, Maximino Greenland, MD  diphenhydramine-acetaminophen (TYLENOL PM) 25-500 MG TABS tablet Take 1 tablet by mouth at bedtime as needed (pain or sleep).     [provider]   Allergies  Allergen Reactions  . Erythromycin    Review of Systems  Unable to perform ROS: Acuity of condition    Physical Exam Constitutional:      Appearance: She is ill-appearing.     Comments: Unresponsive on  morphine gtt  Cardiovascular:     Rate and Rhythm: Normal rate.  Skin:    General: Skin is dry.     Vital Signs: BP (!) 56/41 (BP Location: Left Arm)   Pulse 98   Temp 97.8 F (36.6 C)   Resp 14   Ht 5\' 6"  (1.676 m)   Wt 71.7 kg   SpO2 (!) 85%   BMI 25.51 kg/m  Pain Scale: PAINAD   Pain Score: 0-No pain   SpO2: SpO2: (!) 85 % O2 Device:SpO2: (!) 85 % O2 Flow Rate: .O2 Flow Rate (L/min): 2 L/min  IO: Intake/output summary:   Intake/Output Summary (Last 24 hours) at 07/28/2020 1211 Last data filed at 07/28/2020 0813 Gross per 24 hour  Intake 76.79 ml  Output 0 ml  Net 76.79 ml    LBM: Last BM Date: 07/27/20 Baseline Weight: Weight: 71.7 kg Most recent weight: Weight: 71.7 kg     Palliative Assessment/Data:   Discussed with Dr Reinaldo Berber  Time In: 1100 Time Out: 1200 Time Total: 60 minutes Greater than 50%  of this time was spent counseling and coordinating care related to the above assessment and plan.  Signed by: Wadie Lessen, NP   Please contact Palliative Medicine Team phone at [phone] for questions and concerns.  For individual provider: See Shea Evans

## 2020-07-28 NOTE — Progress Notes (Signed)
07/28/2020   I have seen and evaluated the patient for end of life care.  S:  No events, patient comfortable on opiate drip, nearly comatose.  O: Blood pressure (!) 56/41, pulse 98, temperature 97.8 F (36.6 C), resp. rate 14, height 5\' 6"  (1.676 m), weight 71.7 kg, SpO2 (!) 85 %.  Elderly woman breathing ~6/min MM dry, agonal breathing pattern without accessory muscle use Ext lukewarm Unresponsive to voice or touch  A:  Severe septic shock secondary to urinary tract infection with multiorgan dysfunction with GOC now shifted to comfort  P:  Continue morphine gtt titrated to comfort In hospital death expected Appreciate palliative input Appreciate [facility] taking over care starting 07/27/2020 Son updated by RN  07/28/2020 07/30/2020 MD

## 2020-07-29 LAB — URINE CULTURE: Culture: >=100000 — AB

## 2020-07-30 NOTE — Care Management Important Message (Signed)
Important Message  Patient Details  Name: Tina [name] MRN: [id number] Date of Birth: 1933-06-28   Medicare Important Message Given:  Other (see comment)  Patient is on comfort care and out of respect to the patient and family no Important Message from [facility] given.  Olegario Messier A Sharlotte Baka 07/02/2020, 7:47 AM

## 2020-07-30 NOTE — Progress Notes (Signed)
Pt expired at 5:05 am. Pronounced by this RN and Gerlene Fee, RN. AC notifed, MD notified. Pt's primary RN, Paul Dykes, attempted to notify family without success. Will continue to try.

## 2020-07-30 DEATH — deceased

## 2020-07-31 LAB — CULTURE, BLOOD (ROUTINE X 2)
Culture: NO GROWTH
Culture: NO GROWTH
Special Requests: ADEQUATE
Special Requests: ADEQUATE

## 2020-08-30 NOTE — Death Summary Note (Signed)
DEATH SUMMARY   Patient Details  Name: Tina [name] MRN: [id number] DOB: 16-Jul-1933  Admission/Discharge Information   Admit Date:  07/30/20  Date of Death:    Time of Death:    Length of Stay: 2  Referring Physician: Virginia Crews, MD   Reason(s) for Hospitalization  Septic Shock with multiorgan failure due to urinary tract infection  Diagnoses  Preliminary cause of death:  Secondary Diagnoses (including complications and co-morbidities):  Active Problems:   Septic shock [facility])   Brief Hospital Course (including significant findings, care, treatment, and services provided and events leading to death)  "Tina [name] is a 87 year old female with history of hypertension, osteoarthritis, s/p right total hip arthroplasty (4854), complicated by chronic infection on doxycycline, polymyalgia rheumatica, CKD stage III, perforated bowel due to diverticulitis with complicating fistula who presented via EMS after staff at [facility] found patient to be confused and with hypotension with systolic in the 62V.  EMS noted that she was alert and oriented x 4 initially.  The patient reported lightheadedness.  She admits to reduce oral intake.  She denies any fevers, chills, cough, chest pain, SOB, abdominal pain, nausea, vomiting, diarrhea or urinary symptoms.   In the ED, she was noted with newly elevated Cr of 3.36, BUN of 128, and lactic acid of 2.7.   Her BP was as low as 85/63 and remained low despite 2L fluid bolus.  She was started on Levophed requiring dosage of 57mcg.  CXR shows no acute process but UA micro was notable for WBC > 50.   She was given Rocephin, and upon critical care consultation, request was made to give Cefepime and Vancomycin given the patient's shock state to also cover PsA and MRSA. "  Despite fluids and pressors, patient's clinical condition deteriorated through her hospital stay.  Her son/POA elected to switch her goals of care to comfort and  she passed away peacefully on hospital day 2.   Pertinent Labs and Studies  Significant Diagnostic Studies DG Chest 1 View  Result Date: 07/30/20 CLINICAL DATA:  Lower extremity edema, hypotensive EXAM: CHEST  1 VIEW COMPARISON:  06/21/2020 FINDINGS: Single frontal view of the chest demonstrates an unremarkable cardiac silhouette. Stable ectasia of the thoracic aorta. No airspace disease, effusion, or pneumothorax. IMPRESSION: 1. No acute intrathoracic process. Electronically Signed   By: Randa Ngo M.D.   On: July 30, 2020 19:20   US RENAL  Result Date: 07/27/2020 CLINICAL DATA:  Acute kidney injury EXAM: RENAL / URINARY TRACT ULTRASOUND COMPLETE COMPARISON:  CT abdomen/pelvis dated 12/01/2013 FINDINGS: Right Kidney: Renal measurements: 11.1 x 4.6 x 4.5 cm = volume: 119 mL. Echogenicity within normal limits. Moderate right hydronephrosis, improved. Left Kidney: Renal measurements: 9.5 x 4.1 x 4.1 cm = volume: 83 mL. Echogenicity within normal limits. No mass or hydronephrosis visualized. Bladder: Appears normal for degree of bladder distention. 6 mm stone at the left UVJ. Other: None. IMPRESSION: 6 mm left UVJ stone, without hydronephrosis. Moderate right hydronephrosis. When correlating with remote prior CT, this appearance is improved, and favored to be related to a chronic UPJ stenosis. Electronically Signed   By: Julian Hy M.D.   On: 07/27/2020 12:55    Microbiology No results found for this or any previous visit (from the past 240 hour(s)).  Lab Basic Metabolic Panel: No results for input(s): NA, K, CL, CO2, GLUCOSE, BUN, CREATININE, CALCIUM, MG, PHOS in the last 168 hours. Liver Function Tests: No results for input(s): AST,  ALT, ALKPHOS, BILITOT, PROT, ALBUMIN in the last 168 hours. No results for input(s): LIPASE, AMYLASE in the last 168 hours. No results for input(s): AMMONIA in the last 168 hours. CBC: No results for input(s): WBC, NEUTROABS, HGB, HCT, MCV, PLT in the  last 168 hours. Cardiac Enzymes: No results for input(s): CKTOTAL, CKMB, CKMBINDEX, TROPONINI in the last 168 hours. Sepsis Labs: No results for input(s): PROCALCITON, WBC, LATICACIDVEN in the last 168 hours.   Candee Furbish 08/12/2020, 2:42 PM
